# Patient Record
Sex: Female | Born: 1950 | Race: Black or African American | Hispanic: No | Marital: Married | Smoking: Former smoker
Health system: Southern US, Community
[De-identification: ages and names within clinical notes are randomized; demographics above are authoritative.]

## PROBLEM LIST (undated history)

## (undated) DIAGNOSIS — C50919 Malignant neoplasm of unspecified site of unspecified female breast: Secondary | ICD-10-CM

## (undated) DIAGNOSIS — K2960 Other gastritis without bleeding: Secondary | ICD-10-CM

## (undated) DIAGNOSIS — R51 Headache: Secondary | ICD-10-CM

## (undated) DIAGNOSIS — E119 Type 2 diabetes mellitus without complications: Secondary | ICD-10-CM

## (undated) DIAGNOSIS — M549 Dorsalgia, unspecified: Secondary | ICD-10-CM

## (undated) DIAGNOSIS — I10 Essential (primary) hypertension: Secondary | ICD-10-CM

## (undated) DIAGNOSIS — C801 Malignant (primary) neoplasm, unspecified: Secondary | ICD-10-CM

## (undated) DIAGNOSIS — IMO0001 Reserved for inherently not codable concepts without codable children: Secondary | ICD-10-CM

## (undated) DIAGNOSIS — J45909 Unspecified asthma, uncomplicated: Secondary | ICD-10-CM

## (undated) DIAGNOSIS — E039 Hypothyroidism, unspecified: Secondary | ICD-10-CM

## (undated) DIAGNOSIS — M1990 Unspecified osteoarthritis, unspecified site: Secondary | ICD-10-CM

## (undated) DIAGNOSIS — R52 Pain, unspecified: Secondary | ICD-10-CM

## (undated) DIAGNOSIS — M109 Gout, unspecified: Secondary | ICD-10-CM

## (undated) DIAGNOSIS — T4145XA Adverse effect of unspecified anesthetic, initial encounter: Secondary | ICD-10-CM

## (undated) DIAGNOSIS — E669 Obesity, unspecified: Secondary | ICD-10-CM

## (undated) DIAGNOSIS — G4730 Sleep apnea, unspecified: Secondary | ICD-10-CM

## (undated) DIAGNOSIS — F40240 Claustrophobia: Secondary | ICD-10-CM

## (undated) DIAGNOSIS — I509 Heart failure, unspecified: Secondary | ICD-10-CM

## (undated) DIAGNOSIS — K2980 Duodenitis without bleeding: Secondary | ICD-10-CM

## (undated) DIAGNOSIS — T8859XA Other complications of anesthesia, initial encounter: Secondary | ICD-10-CM

## (undated) DIAGNOSIS — R519 Headache, unspecified: Secondary | ICD-10-CM

## (undated) DIAGNOSIS — K5900 Constipation, unspecified: Secondary | ICD-10-CM

## (undated) DIAGNOSIS — J449 Chronic obstructive pulmonary disease, unspecified: Secondary | ICD-10-CM

## (undated) DIAGNOSIS — F419 Anxiety disorder, unspecified: Secondary | ICD-10-CM

## (undated) DIAGNOSIS — K219 Gastro-esophageal reflux disease without esophagitis: Secondary | ICD-10-CM

## (undated) HISTORY — PX: BACK SURGERY: SHX140

## (undated) HISTORY — PX: FRACTURE SURGERY: SHX138

## (undated) HISTORY — PX: TONSILLECTOMY: SUR1361

## (undated) HISTORY — PX: COLONOSCOPY: SHX174

## (undated) HISTORY — PX: OTHER SURGICAL HISTORY: SHX169

## (undated) HISTORY — PX: ABDOMINAL HYSTERECTOMY: SHX81

## (undated) HISTORY — PX: ANKLE FRACTURE SURGERY: SHX122

## (undated) HISTORY — PX: OOPHORECTOMY: SHX86

## (undated) HISTORY — PX: CHOLECYSTECTOMY: SHX55

---

## 2002-03-01 HISTORY — PX: BREAST BIOPSY: SHX20

## 2003-03-19 ENCOUNTER — Other Ambulatory Visit: Payer: Self-pay

## 2003-03-20 ENCOUNTER — Other Ambulatory Visit: Payer: Self-pay

## 2003-03-21 ENCOUNTER — Other Ambulatory Visit: Payer: Self-pay

## 2003-06-13 ENCOUNTER — Other Ambulatory Visit: Payer: Self-pay

## 2004-06-24 ENCOUNTER — Ambulatory Visit: Payer: Self-pay | Admitting: Family Medicine

## 2004-09-28 ENCOUNTER — Ambulatory Visit: Payer: Self-pay | Admitting: Unknown Physician Specialty

## 2005-12-07 ENCOUNTER — Ambulatory Visit: Payer: Self-pay | Admitting: Unknown Physician Specialty

## 2006-12-13 ENCOUNTER — Ambulatory Visit: Payer: Self-pay | Admitting: Unknown Physician Specialty

## 2008-11-08 ENCOUNTER — Ambulatory Visit: Payer: Self-pay | Admitting: Unknown Physician Specialty

## 2008-11-14 ENCOUNTER — Ambulatory Visit: Payer: Self-pay | Admitting: Unknown Physician Specialty

## 2009-06-26 ENCOUNTER — Ambulatory Visit: Payer: Self-pay | Admitting: Ophthalmology

## 2009-07-01 ENCOUNTER — Ambulatory Visit: Payer: Self-pay | Admitting: Ophthalmology

## 2010-02-05 ENCOUNTER — Ambulatory Visit: Payer: Self-pay | Admitting: Unknown Physician Specialty

## 2010-03-12 ENCOUNTER — Ambulatory Visit: Payer: Self-pay | Admitting: Pain Medicine

## 2010-05-14 ENCOUNTER — Ambulatory Visit: Payer: Self-pay | Admitting: Pain Medicine

## 2010-06-03 ENCOUNTER — Ambulatory Visit: Payer: Self-pay | Admitting: Pain Medicine

## 2010-06-18 ENCOUNTER — Ambulatory Visit: Payer: Self-pay | Admitting: Pain Medicine

## 2010-07-09 ENCOUNTER — Ambulatory Visit: Payer: Self-pay | Admitting: Pain Medicine

## 2011-03-03 ENCOUNTER — Ambulatory Visit: Payer: Self-pay | Admitting: Pain Medicine

## 2011-03-11 ENCOUNTER — Ambulatory Visit: Payer: Self-pay | Admitting: Pain Medicine

## 2011-03-24 ENCOUNTER — Ambulatory Visit: Payer: Self-pay | Admitting: Pain Medicine

## 2011-04-29 ENCOUNTER — Ambulatory Visit: Payer: Self-pay | Admitting: Pain Medicine

## 2011-05-17 ENCOUNTER — Ambulatory Visit: Payer: Self-pay | Admitting: Pain Medicine

## 2011-05-20 ENCOUNTER — Ambulatory Visit: Payer: Self-pay | Admitting: Unknown Physician Specialty

## 2011-06-24 ENCOUNTER — Ambulatory Visit: Payer: Self-pay | Admitting: Pain Medicine

## 2011-08-24 ENCOUNTER — Ambulatory Visit: Payer: Self-pay | Admitting: Pain Medicine

## 2012-02-03 ENCOUNTER — Ambulatory Visit: Payer: Self-pay | Admitting: Pain Medicine

## 2012-04-12 ENCOUNTER — Ambulatory Visit: Payer: Self-pay | Admitting: Pain Medicine

## 2012-08-10 ENCOUNTER — Ambulatory Visit: Payer: Self-pay | Admitting: Unknown Physician Specialty

## 2012-08-25 LAB — COMPREHENSIVE METABOLIC PANEL
Albumin: 4.2 g/dL (ref 3.4–5.0)
Anion Gap: 6 — ABNORMAL LOW (ref 7–16)
BUN: 12 mg/dL (ref 7–18)
Calcium, Total: 9.3 mg/dL (ref 8.5–10.1)
Chloride: 97 mmol/L — ABNORMAL LOW (ref 98–107)
Co2: 32 mmol/L (ref 21–32)
Creatinine: 1.24 mg/dL (ref 0.60–1.30)
EGFR (African American): 55 — ABNORMAL LOW
EGFR (Non-African Amer.): 48 — ABNORMAL LOW
Glucose: 149 mg/dL — ABNORMAL HIGH (ref 65–99)
Osmolality: 273 (ref 275–301)
SGOT(AST): 18 U/L (ref 15–37)
SGPT (ALT): 20 U/L (ref 12–78)
Total Protein: 8.3 g/dL — ABNORMAL HIGH (ref 6.4–8.2)

## 2012-08-25 LAB — CBC
HCT: 41.3 % (ref 35.0–47.0)
MCH: 26.3 pg (ref 26.0–34.0)
MCV: 79 fL — ABNORMAL LOW (ref 80–100)
Platelet: 277 10*3/uL (ref 150–440)
RDW: 14.3 % (ref 11.5–14.5)
WBC: 15.1 10*3/uL — ABNORMAL HIGH (ref 3.6–11.0)

## 2012-08-25 LAB — URINALYSIS, COMPLETE
Glucose,UR: NEGATIVE mg/dL (ref 0–75)
Nitrite: NEGATIVE
Ph: 6 (ref 4.5–8.0)
RBC,UR: 23 /HPF (ref 0–5)
Specific Gravity: 1.018 (ref 1.003–1.030)

## 2012-08-26 ENCOUNTER — Inpatient Hospital Stay: Payer: Self-pay | Admitting: Internal Medicine

## 2012-08-27 LAB — BASIC METABOLIC PANEL
Anion Gap: 8 (ref 7–16)
BUN: 10 mg/dL (ref 7–18)
Calcium, Total: 8.9 mg/dL (ref 8.5–10.1)
Chloride: 99 mmol/L (ref 98–107)
Creatinine: 1.2 mg/dL (ref 0.60–1.30)
EGFR (Non-African Amer.): 49 — ABNORMAL LOW
Potassium: 3.3 mmol/L — ABNORMAL LOW (ref 3.5–5.1)

## 2012-08-27 LAB — CBC WITH DIFFERENTIAL/PLATELET
Basophil #: 0 10*3/uL (ref 0.0–0.1)
Basophil %: 0.3 %
Eosinophil #: 0 10*3/uL (ref 0.0–0.7)
Eosinophil %: 0.1 %
MCH: 26 pg (ref 26.0–34.0)
MCHC: 33.1 g/dL (ref 32.0–36.0)
MCV: 78 fL — ABNORMAL LOW (ref 80–100)
Monocyte #: 1.5 x10 3/mm — ABNORMAL HIGH (ref 0.2–0.9)
Neutrophil #: 11.6 10*3/uL — ABNORMAL HIGH (ref 1.4–6.5)
Neutrophil %: 76.8 %
RBC: 5.25 10*6/uL — ABNORMAL HIGH (ref 3.80–5.20)
RDW: 14.5 % (ref 11.5–14.5)

## 2012-08-27 LAB — HEMOGLOBIN A1C: Hemoglobin A1C: 7.2 % — ABNORMAL HIGH (ref 4.2–6.3)

## 2012-08-28 LAB — CBC WITH DIFFERENTIAL/PLATELET
Basophil #: 0 10*3/uL (ref 0.0–0.1)
Eosinophil #: 0 10*3/uL (ref 0.0–0.7)
HCT: 39.6 % (ref 35.0–47.0)
HGB: 12.9 g/dL (ref 12.0–16.0)
Lymphocyte #: 2.7 10*3/uL (ref 1.0–3.6)
MCH: 25.9 pg — ABNORMAL LOW (ref 26.0–34.0)
MCHC: 32.7 g/dL (ref 32.0–36.0)
MCV: 79 fL — ABNORMAL LOW (ref 80–100)
Monocyte %: 10.5 %
Neutrophil #: 11.8 10*3/uL — ABNORMAL HIGH (ref 1.4–6.5)
Neutrophil %: 72.5 %
Platelet: 270 10*3/uL (ref 150–440)
RBC: 5 10*6/uL (ref 3.80–5.20)
WBC: 16.3 10*3/uL — ABNORMAL HIGH (ref 3.6–11.0)

## 2012-08-28 LAB — COMPREHENSIVE METABOLIC PANEL
Alkaline Phosphatase: 63 U/L (ref 50–136)
BUN: 12 mg/dL (ref 7–18)
Bilirubin,Total: 0.9 mg/dL (ref 0.2–1.0)
Calcium, Total: 8.9 mg/dL (ref 8.5–10.1)
Glucose: 146 mg/dL — ABNORMAL HIGH (ref 65–99)
Osmolality: 276 (ref 275–301)
Potassium: 3.2 mmol/L — ABNORMAL LOW (ref 3.5–5.1)
Sodium: 137 mmol/L (ref 136–145)
Total Protein: 7.2 g/dL (ref 6.4–8.2)

## 2012-08-28 LAB — LIPASE, BLOOD: Lipase: 108 U/L (ref 73–393)

## 2012-08-29 LAB — COMPREHENSIVE METABOLIC PANEL
Albumin: 3.1 g/dL — ABNORMAL LOW (ref 3.4–5.0)
Alkaline Phosphatase: 62 U/L (ref 50–136)
Anion Gap: 6 — ABNORMAL LOW (ref 7–16)
BUN: 13 mg/dL (ref 7–18)
Bilirubin,Total: 0.7 mg/dL (ref 0.2–1.0)
Calcium, Total: 8.4 mg/dL — ABNORMAL LOW (ref 8.5–10.1)
Chloride: 104 mmol/L (ref 98–107)
Co2: 32 mmol/L (ref 21–32)
EGFR (African American): 56 — ABNORMAL LOW
Glucose: 156 mg/dL — ABNORMAL HIGH (ref 65–99)
Osmolality: 286 (ref 275–301)
SGOT(AST): 26 U/L (ref 15–37)
SGPT (ALT): 21 U/L (ref 12–78)
Sodium: 142 mmol/L (ref 136–145)
Total Protein: 6.4 g/dL (ref 6.4–8.2)

## 2012-08-29 LAB — CBC WITH DIFFERENTIAL/PLATELET
Basophil %: 0.5 %
HGB: 11.1 g/dL — ABNORMAL LOW (ref 12.0–16.0)
MCH: 26.2 pg (ref 26.0–34.0)
MCHC: 32.9 g/dL (ref 32.0–36.0)
MCV: 80 fL (ref 80–100)
Monocyte #: 1 x10 3/mm — ABNORMAL HIGH (ref 0.2–0.9)
Neutrophil #: 7.6 10*3/uL — ABNORMAL HIGH (ref 1.4–6.5)
Neutrophil %: 60.8 %
RBC: 4.25 10*6/uL (ref 3.80–5.20)
RDW: 14.4 % (ref 11.5–14.5)
WBC: 12.5 10*3/uL — ABNORMAL HIGH (ref 3.6–11.0)

## 2012-09-14 ENCOUNTER — Ambulatory Visit: Payer: Self-pay | Admitting: Gastroenterology

## 2012-09-15 LAB — PATHOLOGY REPORT

## 2012-10-04 ENCOUNTER — Ambulatory Visit: Payer: Self-pay | Admitting: Pain Medicine

## 2012-10-23 ENCOUNTER — Other Ambulatory Visit: Payer: Self-pay | Admitting: Pain Medicine

## 2012-10-23 LAB — MAGNESIUM: Magnesium: 1 mg/dL — ABNORMAL LOW

## 2012-10-23 LAB — HEMOGLOBIN A1C: Hemoglobin A1C: 8 % — ABNORMAL HIGH (ref 4.2–6.3)

## 2012-10-27 ENCOUNTER — Ambulatory Visit: Payer: Self-pay | Admitting: Pain Medicine

## 2013-07-12 DIAGNOSIS — M519 Unspecified thoracic, thoracolumbar and lumbosacral intervertebral disc disorder: Secondary | ICD-10-CM | POA: Insufficient documentation

## 2013-07-12 DIAGNOSIS — J449 Chronic obstructive pulmonary disease, unspecified: Secondary | ICD-10-CM | POA: Insufficient documentation

## 2013-07-12 DIAGNOSIS — M109 Gout, unspecified: Secondary | ICD-10-CM | POA: Insufficient documentation

## 2013-07-12 DIAGNOSIS — E119 Type 2 diabetes mellitus without complications: Secondary | ICD-10-CM | POA: Insufficient documentation

## 2013-07-12 DIAGNOSIS — I10 Essential (primary) hypertension: Secondary | ICD-10-CM | POA: Insufficient documentation

## 2013-07-12 DIAGNOSIS — M1990 Unspecified osteoarthritis, unspecified site: Secondary | ICD-10-CM | POA: Insufficient documentation

## 2014-01-03 ENCOUNTER — Ambulatory Visit: Payer: Self-pay | Admitting: Internal Medicine

## 2014-02-07 ENCOUNTER — Ambulatory Visit: Payer: Self-pay | Admitting: Internal Medicine

## 2014-02-20 DIAGNOSIS — E039 Hypothyroidism, unspecified: Secondary | ICD-10-CM | POA: Insufficient documentation

## 2014-03-01 DIAGNOSIS — C50919 Malignant neoplasm of unspecified site of unspecified female breast: Secondary | ICD-10-CM

## 2014-03-01 HISTORY — PX: BREAST LUMPECTOMY: SHX2

## 2014-03-01 HISTORY — PX: BREAST BIOPSY: SHX20

## 2014-03-01 HISTORY — DX: Malignant neoplasm of unspecified site of unspecified female breast: C50.919

## 2014-06-14 ENCOUNTER — Other Ambulatory Visit: Payer: Self-pay | Admitting: Internal Medicine

## 2014-06-14 DIAGNOSIS — N63 Unspecified lump in unspecified breast: Secondary | ICD-10-CM

## 2014-06-21 NOTE — Consult Note (Signed)
Pt seen and examined. Full consult to follow. Admitted with intractable nausea/vomiting plus low abd pain/tenderness. Has UTI. Prob has diverticulitis as well though CT has not been schedule due to intractable vomiting. Has hx of indigestion and has been on prilosec in the past. Also, has experienced changes in bowel habits recently. Had GB surgery  in the past.  Apparently, patient was seen in our office recently and was scheduled for both EGD/colon on 7/7. Though her upper GI sxs may be related to ileus/diverticulitis, will plan EGD tomorrow and keep colonoscopy on schedule for 7/7 for now. Thanks.   Electronic Signatures: Verdie Shire (MD) (Signed on 29-Jun-14 09:27)  Authored   Last Updated: 29-Jun-14 10:33 by Verdie Shire (MD)

## 2014-06-21 NOTE — Consult Note (Signed)
PATIENT NAME:  Teresa Holland, YBANEZ MR#:  [id number] DATE OF BIRTH:  1950/11/27  DATE OF CONSULTATION:  08/28/2012  CONSULTING PHYSICIAN:  Lupita Dawn. Crew Goren, MD  REASON FOR REFERRAL: Intractable nausea and vomiting.   HISTORY OF PRESENT ILLNESS: The patient is a 64 year old, obese, black female who has history of diabetes and hypertension. She also is on chronic narcotic dependence because of her chronic back pain. She presents to the Emergency Room with intractable nausea and vomiting for the past 3 to 4 days. She initially denied abdominal pain. However, she kind of having some lower abdominal cramping afterwards. She is not able to keep anything down.  Even her medications, she was not able to tolerate. When she presented to the Emergency Room, they checked some blood and urinalysis was checked.   The results were consistent with urinary tract infection. She was also hypertensive as well because she has not been able to take her antihypertensive agents.   PAST MEDICAL HISTORY: Diabetes, hypertension, obesity, and chronic back pain.   PAST SURGICAL HISTORY:  Include back surgery, hysterectomy and ankle surgery.   ALLERGIES: SHE IS ALLERGIC TO ASPIRIN.   HOME MEDICATIONS: include metoprolol twice a day, metformin 1000 mg once a day, potassium twice a day, losartan daily, Lasix daily and enalapril 20 mg daily as well as Zocor 80 mg once a day. She also takes OxyContin 40 mg every 12 hours plus omeprazole daily.   SOCIAL HISTORY: She still smokes a pack a day. She denies any alcohol use.   FAMILY HISTORY: Notable for colon cancer and lung cancer.  Apparently she was in our GI office and was scheduled to have upper endoscopy and colonoscopy on 06/07. She has noticed some changes in her bowel habits as well recently.   PHYSICAL EXAMINATION:  The patient is in no acute distress.  She is afebrile. Vital signs are stable. She is morbidly obese.   HEENT: Normocephalic, atraumatic head. Pupils are equally  reactive. Throat was clear.   NECK: Supple.   CARDIAC: Regular rhythm and rate without murmurs.   LUNGS: Clear bilaterally.   ABDOMEN: Normoactive bowel sounds, soft. There was some mild tenderness in the lower abdomen. There is no rebound or guarding. There is no hepatomegaly. She had active bowel sounds. There are no palpable masses.   EXTREMITIES: No clubbing, cyanosis or edema.   REVIEW OF SYSTEMS: Please there is no significant change from the review of symptoms that was dictated on admission, on 06/28.   LABS/STUDIES: Urinalysis showed 3+ and equal leukocyte esterase with 90 WBCs. Creatinine was 1.24, the rest of the metabolic panel was normal. White count was 15,000, hemoglobin 13.7. Lipase was 198.   IMPRESSION:  This is female with intractable nausea, vomiting. She has urinary tract infection. It is possible she may have diverticulitis as well with changes in bowel habits and low abdominal pain. Unfortunately because of her nausea and vomiting, we cannot order a CT scan to confirm this. The nausea and vomiting could be related to her urinary tract infection or diverticulitis. It is possible she may have partial small bowel obstruction. She is not completely obstructed because she is still able to move her bowels. We will still need to rule out other causes of nausea and vomiting including peptic ulcer disease or esophagitis. Her gallbladder was removed some years ago.   RECOMMENDATION: Therefore, we will schedule an upper endoscopy tomorrow to at least evaluate the upper GI tract for other causes of nausea and vomiting. She  is on antibiotics, which she will continue. We will keep the schedule for the colonoscopy; it was already scheduled for 07/07. Will move the colonoscopy scheduled if her condition changes.    ____________________________ Lupita Dawn. Candace Cruise, MD pyo:rw D: 08/28/2012 13:51:05 ET T: 08/28/2012 14:37:59 ET JOB#: [id number]  cc: Lupita Dawn. Candace Cruise, MD, <Dictator> Lupita Dawn Raylei Losurdo  MD ELECTRONICALLY SIGNED 08/28/2012 16:24

## 2014-06-21 NOTE — H&P (Signed)
PATIENT NAME:  Teresa Holland, Teresa Holland MR#:  [id number] DATE OF BIRTH:  1950-10-21  DATE OF ADMISSION:  08/26/2012  PRIMARY CARE PHYSICIAN:  Dr. Kem Kays.   CHIEF COMPLAINT:  Nausea, vomiting.   HISTORY OF PRESENT ILLNESS:  Teresa Holland is a 64 year old morbidly obese white female with history of hypertension, diabetes mellitus, chronic narcotic dependence, presented to the Emergency Department with 3 to 4 days of nausea, vomiting multiple times.  Denies having any abdominal pain.  Unable to keep down any food.  Try to take her pain medications, unable to keep down.  Concerning this, also stopped taking all blood pressure medication.  Work-up in the Emergency Department, the patient has a urinary tract infection.  Is also found to have elevated white blood cell count.  The patient states has been having subjective fevers.  Blood and urine cultures were obtained, given one dose of Rocephin in the Emergency Department.  The patient's blood pressure is 208/90.  Denies having any chest pain, palpitations.  Denies having any lower extremity swelling.  Has mild cough.  No productive sputum.   PAST MEDICAL HISTORY:  Diabetes mellitus, hypertension, morbid obesity, chronic back pain.   PAST SURGICAL HISTORY:   1.  Ankle surgery. 2.  Hysterectomy.  3.  Back surgery.   ALLERGIES:  ASPIRIN.   HOME MEDICATIONS: 1. Metoprolol tartrate 50 mg 2 times a day.  2.  Metformin 1000 mg once a day.  3.  Klor-Con 10 mEq 2 times a day.  4.  Losartan hydrochlorothiazide 25/100 mg daily.  5.  Lasix 20 mg daily.  6.  Enalapril 20 mg once a day.  7.  Vitamin D3 2000 units once a day.  8.  Simvastatin 80 mg once a day. 9.  OxyContin 40 mg every 12 hours. 10.  Omeprazole 20 mg once a day.   SOCIAL HISTORY:  Continues to smoke 1 pack a day.  Denies drinking alcohol or using illicit drugs.  Married, lives with her husband, currently not working.   FAMILY HISTORY:  Mother died of colon cancer.  Father died of lung  cancer.   REVIEW OF SYSTEMS: CONSTITUTIONAL:  Generalized weakness, fatigue.  EYES:  No change in vision.  EARS, NOSE, THROAT:  No change in hearing.  RESPIRATORY:  Has cough.  No shortness of breath.  CARDIOVASCULAR:  No chest pain, palpitations.  GASTROINTESTINAL:  The patient has nausea, vomiting.  No diarrhea.  GENITOURINARY:  Has dysuria.  No hematuria.  ENDOCRINE:  No polyphagia or polydipsia.  Has diabetes mellitus.  HEMATOLOGIC:  No easy bruising or bleeding.  SKIN:  No rash or lesions.  MUSCULOSKELETAL:  Good range of motion in all the extremities.   NEUROLOGIC:  No weakness or numbness in any part of the body.   PHYSICAL EXAMINATION: GENERAL:  This is a well-built, well-nourished, morbidly obese female lying down in the bed, not in distress.  VITAL SIGNS:  Temperature 98.4, pulse 79, blood pressure 207/108, respiratory rate of 18, oxygen saturations 95% on room air.  HEENT:  Head normocephalic, atraumatic.  Eyes, no scleral icterus.  Conjunctivae normal.  Pupils equal and react to light.  Mucous membranes moist.  No pharyngeal erythema.  NECK:  Supple.  No lymphadenopathy.  No JVD.  No carotid bruit.  No thyromegaly.  CHEST:  Has no focal tenderness.  LUNGS:  Bilaterally clear to auscultation.  HEART:  S1 and S2 regular.  No murmurs are heard.  No pedal edema.  Pulses 2+.  SKIN:  No rash or lesions.  MUSCULOSKELETAL:  Good range of motion in all the extremities.  ABDOMEN:  Bowel sounds plus.  Soft.  Mild tenderness in the suprapubic area.  No rebound, no guarding.  NEUROLOGIC:  The patient is alert, oriented to place, person and time.  Cranial nerves II through XII intact.  Motor 5 by 5.   LABORATORY DATA:  UA leukocytes esterase 3+, WBC of 90, has epithelial cells.  CMP, BUN 12, creatinine of 1.24.  The rest of all the values are within normal limits.   CBC, WBC of 15, hemoglobin 13.7, the rest of the values are within normal limits.   Lipase 198.   ASSESSMENT AND PLAN:   Teresa Holland is a 64 year old female who comes to the Emergency Department with nausea and vomiting.  1.  Pyelonephritis.  Follow up with urine and blood cultures.  Continue with Rocephin, IV fluids.  Based on the cultures we will de-escalate the antibiotics.  2.  Nausea, vomiting, most likely from the urinary tract infection, however cannot exclude gastroparesis and narcotic pain medication.  We will keep the patient nothing by mouth, continue with IV fluids and follow up.  I will provide antinausea medications.  3.  Diabetes mellitus, insulin-dependent.  Continue the home dose.  4.  Hypertension, poorly-controlled secondary to noncompliance.  Start back on home medications and follow up.  5.  Keep the patient on DVT prophylaxis with Lovenox.   Time spent 39min.   ____________________________ Monica Becton, MD pv:ea D: 08/26/2012 01:16:41 ET T: 08/26/2012 03:19:43 ET JOB#: [id number]  cc: Monica Becton, MD, <Dictator> Monica Becton MD ELECTRONICALLY SIGNED 09/06/2012 21:53

## 2014-06-21 NOTE — Discharge Summary (Signed)
PATIENT NAME:  Teresa Holland, Teresa Holland MR#:  [id number] DATE OF BIRTH:  23-Jan-1951  DATE OF ADMISSION:  08/26/2012 DATE OF DISCHARGE:  08/29/2012   DISCHARGE DIAGNOSES:  1. Acute diverticulitis.  2. Enterococcal urinary tract infection.  3. Candidal esophagitis.  4. Diabetes mellitus.  5. Restrictive airway disease.  6. Hypertension.   DISCHARGE MEDICATIONS:  1. Metformin 1000 mg daily.  2. Metoprolol tartrate 50 mg b.i.d. 3. Omeprazole 20 mg daily.  4. Furosemide 20 mg daily as needed.  5. Potassium chloride 10 mEq b.i.d. 6. Vitamin D 2000 units daily.  7. Enalapril 20 mg daily.  8. Fluconazole 100 mg daily for 7 days.  9. Albuterol 2 puffs q.6 p.r.n.  10. Levaquin 500 mg daily for 7 days.  11. Senna 1 b.i.d. p.r.n. constipation.   REASON FOR ADMISSION: A 64 year old female presents with intractable nausea and vomiting. Please see H and P for HPI, past medical history and physical exam.   HOSPITAL COURSE: The patient was admitted, hydrated and found to have a fairly pansensitive enterococcal UTI. Initially, her white count was not improving with antibiotics. She was switched over to [place]. Her white count dropped to 12,000 prior to discharge. Her abdominal pain improved. Her bowels are moving better. Her exam was consistent with a UTI and diverticulitis. She is set up for a colonoscopy in early July. She underwent EGD which showed candidal esophagitis from her inhalers and diabetes. The Advair was discontinued. She was placed on Diflucan. Will hold Zocor for this. Followup scope in 2 weeks. Her potassium was replaced throughout this time.   OVERALL PROGNOSIS: Good.   FOLLOWUP: With Dr. Sabra Heck in 2 weeks.   ____________________________ Rusty Aus, MD mfm:OSi D: 08/29/2012 07:45:29 ET T: 08/29/2012 08:21:19 ET JOB#: [id number]  cc: Rusty Aus, MD, <Dictator> MARK Roselee Culver MD ELECTRONICALLY SIGNED 08/30/2012 8:11

## 2014-06-21 NOTE — Consult Note (Signed)
Less nausea and vomiting. EGD showed diffuse candidal esophagitis, which can cause N/V. Diflucan 100 mg daily x 5 days ordered. Since diflucan interacts with zocor, zocor put on hold until diflucan finished. Advance diet as tolerated. Continue Abx for presumed UTI/diverticulitis. I will be out tomorrow but will check back on Wed if patient still here.  Electronic Signatures: Verdie Shire (MD)  (Signed on 30-Jun-14 13:44)  Authored  Last Updated: 30-Jun-14 13:44 by Verdie Shire (MD)

## 2014-08-08 ENCOUNTER — Ambulatory Visit
Admission: RE | Admit: 2014-08-08 | Discharge: 2014-08-08 | Disposition: A | Discharge status: Home / Self Care | Payer: PRIVATE HEALTH INSURANCE | Source: Ambulatory Visit | Attending: Internal Medicine | Admitting: Internal Medicine

## 2014-08-08 DIAGNOSIS — N63 Unspecified lump in unspecified breast: Secondary | ICD-10-CM

## 2014-08-13 ENCOUNTER — Other Ambulatory Visit: Payer: Self-pay | Admitting: Internal Medicine

## 2014-08-13 DIAGNOSIS — N63 Unspecified lump in unspecified breast: Secondary | ICD-10-CM

## 2014-08-13 DIAGNOSIS — R928 Other abnormal and inconclusive findings on diagnostic imaging of breast: Secondary | ICD-10-CM

## 2014-08-22 ENCOUNTER — Ambulatory Visit
Admission: RE | Admit: 2014-08-22 | Discharge: 2014-08-22 | Disposition: A | Discharge status: Home / Self Care | Payer: No Typology Code available for payment source | Source: Ambulatory Visit | Attending: Internal Medicine | Admitting: Internal Medicine

## 2014-08-22 ENCOUNTER — Other Ambulatory Visit: Payer: Self-pay | Admitting: Internal Medicine

## 2014-08-22 DIAGNOSIS — K760 Fatty (change of) liver, not elsewhere classified: Secondary | ICD-10-CM | POA: Diagnosis not present

## 2014-08-22 DIAGNOSIS — R1084 Generalized abdominal pain: Secondary | ICD-10-CM

## 2014-08-22 DIAGNOSIS — M47896 Other spondylosis, lumbar region: Secondary | ICD-10-CM | POA: Insufficient documentation

## 2014-08-22 MED ORDER — IOHEXOL 300 MG/ML  SOLN
100.0000 mL | Freq: Once | INTRAMUSCULAR | Status: AC | PRN
  Administered 2014-08-22: 100 mL via INTRAVENOUS

## 2014-08-23 ENCOUNTER — Ambulatory Visit: Payer: PRIVATE HEALTH INSURANCE

## 2014-08-28 ENCOUNTER — Other Ambulatory Visit: Payer: Self-pay | Admitting: Internal Medicine

## 2014-08-28 DIAGNOSIS — N63 Unspecified lump in unspecified breast: Secondary | ICD-10-CM

## 2014-08-28 DIAGNOSIS — R928 Other abnormal and inconclusive findings on diagnostic imaging of breast: Secondary | ICD-10-CM

## 2014-08-29 ENCOUNTER — Other Ambulatory Visit: Payer: Self-pay | Admitting: Internal Medicine

## 2014-08-29 ENCOUNTER — Ambulatory Visit
Admission: RE | Admit: 2014-08-29 | Discharge: 2014-08-29 | Disposition: A | Discharge status: Home / Self Care | Payer: PRIVATE HEALTH INSURANCE | Source: Ambulatory Visit | Attending: Internal Medicine | Admitting: Internal Medicine

## 2014-08-29 DIAGNOSIS — R928 Other abnormal and inconclusive findings on diagnostic imaging of breast: Secondary | ICD-10-CM

## 2014-08-29 DIAGNOSIS — N63 Unspecified lump in unspecified breast: Secondary | ICD-10-CM

## 2014-08-29 DIAGNOSIS — C50911 Malignant neoplasm of unspecified site of right female breast: Secondary | ICD-10-CM | POA: Diagnosis not present

## 2014-08-29 DIAGNOSIS — Z803 Family history of malignant neoplasm of breast: Secondary | ICD-10-CM | POA: Diagnosis not present

## 2014-09-03 LAB — SURGICAL PATHOLOGY

## 2014-09-12 ENCOUNTER — Encounter
Admission: RE | Admit: 2014-09-12 | Discharge: 2014-09-12 | Disposition: A | Discharge status: Home / Self Care | Payer: PRIVATE HEALTH INSURANCE | Source: Ambulatory Visit | Attending: Surgery | Admitting: Surgery

## 2014-09-12 ENCOUNTER — Encounter: Payer: Self-pay | Admitting: Oncology

## 2014-09-12 ENCOUNTER — Inpatient Hospital Stay: Payer: PRIVATE HEALTH INSURANCE | Attending: Oncology | Admitting: Oncology

## 2014-09-12 VITALS — BP 160/92 | HR 77 | Wt 380.1 lb

## 2014-09-12 DIAGNOSIS — R1084 Generalized abdominal pain: Secondary | ICD-10-CM | POA: Diagnosis not present

## 2014-09-12 DIAGNOSIS — M1990 Unspecified osteoarthritis, unspecified site: Secondary | ICD-10-CM | POA: Insufficient documentation

## 2014-09-12 DIAGNOSIS — E039 Hypothyroidism, unspecified: Secondary | ICD-10-CM | POA: Insufficient documentation

## 2014-09-12 DIAGNOSIS — C50511 Malignant neoplasm of lower-outer quadrant of right female breast: Secondary | ICD-10-CM | POA: Insufficient documentation

## 2014-09-12 DIAGNOSIS — F17210 Nicotine dependence, cigarettes, uncomplicated: Secondary | ICD-10-CM | POA: Diagnosis not present

## 2014-09-12 DIAGNOSIS — Z90710 Acquired absence of both cervix and uterus: Secondary | ICD-10-CM | POA: Diagnosis not present

## 2014-09-12 DIAGNOSIS — Z8669 Personal history of other diseases of the nervous system and sense organs: Secondary | ICD-10-CM | POA: Diagnosis not present

## 2014-09-12 DIAGNOSIS — E669 Obesity, unspecified: Secondary | ICD-10-CM | POA: Diagnosis not present

## 2014-09-12 DIAGNOSIS — J449 Chronic obstructive pulmonary disease, unspecified: Secondary | ICD-10-CM | POA: Insufficient documentation

## 2014-09-12 DIAGNOSIS — M549 Dorsalgia, unspecified: Secondary | ICD-10-CM | POA: Diagnosis not present

## 2014-09-12 DIAGNOSIS — R140 Abdominal distension (gaseous): Secondary | ICD-10-CM | POA: Diagnosis not present

## 2014-09-12 DIAGNOSIS — Z170 Estrogen receptor positive status [ER+]: Secondary | ICD-10-CM | POA: Diagnosis not present

## 2014-09-12 DIAGNOSIS — K219 Gastro-esophageal reflux disease without esophagitis: Secondary | ICD-10-CM | POA: Insufficient documentation

## 2014-09-12 DIAGNOSIS — G4730 Sleep apnea, unspecified: Secondary | ICD-10-CM | POA: Insufficient documentation

## 2014-09-12 DIAGNOSIS — I10 Essential (primary) hypertension: Secondary | ICD-10-CM | POA: Diagnosis not present

## 2014-09-12 DIAGNOSIS — K760 Fatty (change of) liver, not elsewhere classified: Secondary | ICD-10-CM | POA: Diagnosis not present

## 2014-09-12 DIAGNOSIS — F329 Major depressive disorder, single episode, unspecified: Secondary | ICD-10-CM | POA: Diagnosis not present

## 2014-09-12 DIAGNOSIS — E119 Type 2 diabetes mellitus without complications: Secondary | ICD-10-CM | POA: Insufficient documentation

## 2014-09-12 DIAGNOSIS — Z9049 Acquired absence of other specified parts of digestive tract: Secondary | ICD-10-CM | POA: Insufficient documentation

## 2014-09-12 DIAGNOSIS — M129 Arthropathy, unspecified: Secondary | ICD-10-CM | POA: Insufficient documentation

## 2014-09-12 DIAGNOSIS — G8929 Other chronic pain: Secondary | ICD-10-CM

## 2014-09-12 DIAGNOSIS — I708 Atherosclerosis of other arteries: Secondary | ICD-10-CM | POA: Insufficient documentation

## 2014-09-12 DIAGNOSIS — Z01810 Encounter for preprocedural cardiovascular examination: Secondary | ICD-10-CM | POA: Insufficient documentation

## 2014-09-12 DIAGNOSIS — J45909 Unspecified asthma, uncomplicated: Secondary | ICD-10-CM | POA: Diagnosis not present

## 2014-09-12 DIAGNOSIS — C50411 Malignant neoplasm of upper-outer quadrant of right female breast: Secondary | ICD-10-CM | POA: Diagnosis not present

## 2014-09-12 DIAGNOSIS — F419 Anxiety disorder, unspecified: Secondary | ICD-10-CM | POA: Insufficient documentation

## 2014-09-12 DIAGNOSIS — C50911 Malignant neoplasm of unspecified site of right female breast: Secondary | ICD-10-CM

## 2014-09-12 HISTORY — DX: Heart failure, unspecified: I50.9

## 2014-09-12 HISTORY — DX: Adverse effect of unspecified anesthetic, initial encounter: T41.45XA

## 2014-09-12 HISTORY — DX: Type 2 diabetes mellitus without complications: E11.9

## 2014-09-12 HISTORY — DX: Hypothyroidism, unspecified: E03.9

## 2014-09-12 HISTORY — DX: Unspecified asthma, uncomplicated: J45.909

## 2014-09-12 HISTORY — DX: Headache, unspecified: R51.9

## 2014-09-12 HISTORY — DX: Essential (primary) hypertension: I10

## 2014-09-12 HISTORY — DX: Unspecified osteoarthritis, unspecified site: M19.90

## 2014-09-12 HISTORY — DX: Other complications of anesthesia, initial encounter: T88.59XA

## 2014-09-12 HISTORY — DX: Sleep apnea, unspecified: G47.30

## 2014-09-12 HISTORY — DX: Anxiety disorder, unspecified: F41.9

## 2014-09-12 HISTORY — DX: Gastro-esophageal reflux disease without esophagitis: K21.9

## 2014-09-12 HISTORY — DX: Malignant (primary) neoplasm, unspecified: C80.1

## 2014-09-12 HISTORY — DX: Gout, unspecified: M10.9

## 2014-09-12 HISTORY — DX: Headache: R51

## 2014-09-12 HISTORY — DX: Pain, unspecified: R52

## 2014-09-12 NOTE — Progress Notes (Signed)
Met with patient, and husband at initial visit with Dr. Oliva Bustard.  She met with Dr. Nicholes Stairs last Thursday. Breast Cancer treatment Handbook/folderwith hospital services given to her at that visit.  Scheduled for lumpectomy with sentinel node biopsy 09/20/14.  She is to meet with Dr. Baruch Gouty on 09/19/14 to discuss radiation options. States she has had multiple back surgeries.   Unable to work due to back problems. Husband works 12 hour shifts .  Discussed some of the services offered through the [facility], and offered to make referrals if needed.  Escorted to pre-admit testing.

## 2014-09-12 NOTE — Progress Notes (Signed)
Patient does not have living will.  Information given.  Former cigarette smoker.  Now smokes e-cigarettes.

## 2014-09-12 NOTE — Patient Instructions (Signed)
  Your procedure is scheduled on:09/20/14 Report to [place]  8:00 To find out your arrival time please call [phone] between 1PM - 3PM on   Remember: Instructions that are not followed completely may result in serious medical risk, up to and including death, or upon the discretion of your surgeon and anesthesiologist your surgery may need to be rescheduled.    ___X_ 1. Do not eat food or drink liquids after midnight. No gum chewing or hard candies.     _X___ 2. No Alcohol for 24 hours before or after surgery.   ____ 3. Bring all medications with you on the day of surgery if instructed.    _X___ 4. Notify your doctor if there is any change in your medical condition     (cold, fever, infections).     Do not wear jewelry, make-up, hairpins, clips or nail polish.  Do not wear lotions, powders, or perfumes. You may wear deodorant.  Do not shave 48 hours prior to surgery. Men may shave face and neck.  Do not bring valuables to the hospital.    [facility] is not responsible for any belongings or valuables.               Contacts, dentures or bridgework may not be worn into surgery.  Leave your suitcase in the car. After surgery it may be brought to your room.  For patients admitted to the hospital, discharge time is determined by your                treatment team.   Patients discharged the day of surgery will not be allowed to drive home.   Please read over the following fact sheets that you were given:   Surgical Site Infection Prevention   ____ Take these medicines the morning of surgery with A SIP OF WATER:    1. VASOTEC  2. MAG OX  3. METOPROLOL  4.PROTONIX  5.  6.  ____ Fleet Enema (as directed)   _X___ Use CHG Soap as directed  ___X_ Use inhalers on the day of surgery  ____ Stop metformin 2 days prior to surgery    ____ Take 1/2 of usual insulin dose the night before surgery and none on the morning of surgery.   ____ Stop Coumadin/Plavix/aspirin  on   ____ Stop Anti-inflammatories on    ____ Stop supplements until after surgery.    ____ Bring C-Pap to the hospital.

## 2014-09-13 ENCOUNTER — Institutional Professional Consult (permissible substitution): Payer: PRIVATE HEALTH INSURANCE | Admitting: Radiation Oncology

## 2014-09-15 ENCOUNTER — Encounter: Payer: Self-pay | Admitting: Oncology

## 2014-09-15 DIAGNOSIS — C50911 Malignant neoplasm of unspecified site of right female breast: Secondary | ICD-10-CM | POA: Insufficient documentation

## 2014-09-15 NOTE — Progress Notes (Signed)
Cancer Center @ [facility] [phone]) [phone]  [phone]   INITIAL CONSULT  Teresa Holland OB: December 17, 1950  MR#: [id number]  BMD#:[id number]  Patient Care Team: Danella Penton, MD as PCP - General (Internal Medicine)  CHIEF COMPLAINT:  Chief Complaint  Patient presents with  . New Evaluation    VISIT DIAGNOSIS:     ICD-9-CM ICD-10-CM   1. Cancer of right breast 174.9 C50.911      Oncology History   1.  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  Morbid obesity 3.  Previous left breast biopsy in 1998 negative     Cancer of right breast   09/15/2014 Initial Diagnosis Cancer of right breast    [name] came with stereotactic biopsy of the right upper outer quadrant of carcinoma of breast. Patient is now being evaluated prior to definitive surgical intervention INTERVAL HISTORY: Patient had a bilateral mammogram in November of [address] a mass was noted in the right breast.  She had a distal spot compression and further follow-up mammogram in June of 2016 this mass revealed a 9 x 8 x 6 mm mass.  Ultrasound did not reveal any lymph node involvement.  Stereotactic biopsy was positive for invasive mammary carcinoma.  Which was estrogen receptor progesterone receptor positive.  And HER-2/neu receptor negative. Patient underwent stereotactic biopsy and was referred to me prior to surgical evaluation for planning of treatment  REVIEW OF SYSTEMS:   GENERAL:  Feels good.  Active.  No fevers,   Patient is  morbidly obese with weighing 380 pounds PERFORMANCE STATUS (ECOG):  1 HEENT:  No visual changes, runny nose, sore throat, mouth sores or tenderness. Lungs: No shortness of breath or cough.  No hemoptysis. Cardiac:  No chest pain, palpitations, orthopnea, or PND. GI:  No nausea, vomiting,  diarrhea, constipation, melena or hematochezia. GU:  No urgency, frequency, dysuria, or hematuria. Musculoskeletal: Neck back pain with multiple back surgery  Extremities:  No pain or swelling. Skin:  No rashes or skin changes. Neuro:  No headache, numbness or weakness, balance or coordination issues. Endocrine:  No diabetes, thyroid issues, hot flashes or night sweats. Psych:  No mood changes, depression or anxiety. Pain:  No focal pain. Review of systems:  All other systems reviewed and found to be negative.  As per HPI. Otherwise, a complete review of systems is negatve.  PAST MEDICAL HISTORY: Past Medical History  Diagnosis Date  . Complication of anesthesia     heart issues 2004 during gb surgery had to be revived  . Hypertension   . Sleep apnea     could not use cpap uses 1.5 l [place] at night  . Asthma   . Hypothyroidism   . Diabetes mellitus without complication   . Depression   . Anxiety   . GERD (gastroesophageal reflux disease)   . Headache   . Arthritis   . Cancer     breast  . Gout   . Pain     chronic back  . CHF (congestive heart failure)     PAST SURGICAL HISTORY: Past Surgical History  Procedure Laterality Date  . Breast biopsy Left 2004    negative  . Cholecystectomy    . Abdominal hysterectomy    . Oophorectomy    . Ankle fracture surgery    . Back surgery  lumbar fusion    FAMILY HISTORY Family History  Problem Relation Age of Onset  . Breast cancer Paternal Aunt     45's       ADVANCED DIRECTIVES:  Patient does not have any living will or healthcare power of attorney.  Information was given .  Available resources had been discussed.  We will follow-up on subsequent appointments regarding this issue  HEALTH MAINTENANCE: History  Substance Use Topics  . Smoking status: Current Every Day Smoker    Types: E-cigarettes  . Smokeless tobacco: Never Used  . Alcohol Use: Yes      Allergies  Allergen Reactions  . Aspirin Nausea  Only    Stomach burning  . Meloxicam Nausea Only  . Nsaids Nausea Only  . Furosemide Palpitations    Current Outpatient Prescriptions  Medication Sig Dispense Refill  . albuterol (VENTOLIN HFA) 108 (90 BASE) MCG/ACT inhaler Inhale into the lungs.    . Cholecalciferol (VITAMIN D3) 2000 UNITS capsule Take by mouth.    . diphenhydrAMINE (BENADRYL) 25 mg capsule Take by mouth.    . enalapril (VASOTEC) 20 MG tablet Take by mouth.    . furosemide (LASIX) 20 MG tablet Take by mouth.    Marland Kitchen glimepiride (AMARYL) 2 MG tablet Take by mouth.    . hydrochlorothiazide (HYDRODIURIL) 25 MG tablet Take by mouth.    Marland Kitchen HYDROcodone-acetaminophen (NORCO/VICODIN) 5-325 MG per tablet Take by mouth.    . levothyroxine (SYNTHROID, LEVOTHROID) 75 MCG tablet Take by mouth.    . Linaclotide (LINZESS) 145 MCG CAPS capsule bid    . magnesium oxide (MAG-OX) 400 MG tablet TAKE ONE TABLET BY MOUTH TWICE DAILY    . metoprolol (LOPRESSOR) 50 MG tablet Take by mouth.    . pantoprazole (PROTONIX) 40 MG tablet Take by mouth.    . polyethylene glycol (MIRALAX / GLYCOLAX) packet Take by mouth.    . potassium chloride (K-DUR) 10 MEQ tablet Take by mouth.    . simvastatin (ZOCOR) 80 MG tablet Take by mouth.    Marland Kitchen guaiFENesin (MUCINEX) 600 MG 12 hr tablet Take by mouth 2 (two) times daily as needed.    Marland Kitchen Phenylephrine-Acetaminophen 5-325 MG TABS Take by mouth as needed.     No current facility-administered medications for this visit.    OBJECTIVE: PHYSICAL EXAM: GENERAL:  Well developed, well nourished, sitting comfortably in the exam room in no acute distress. MENTAL STATUS:  Alert and oriented to person, place and time. HEAD:  .  Normocephalic, atraumatic, face symmetric, no Cushingoid features. EYES: .  Pupils equal round and reactive to light and accomodation.  No conjunctivitis or scleral icterus.   RESPIRATORY:  Clear to auscultation without rales, wheezes or rhonchi. CARDIOVASCULAR:  Regular rate and rhythm without  murmur, rub or gallop. BREAST:  Right breast without masses, biopsy site has in duration . skin changes or nipple discharge.  Left breast without masses, skin changes or nipple discharge. ABDOMEN:  Soft, non-tender, with active bowel sounds, and no hepatosplenomegaly.  No masses. BACK:  No CVA tenderness.  No tenderness on percussion of the back or rib cage. SKIN:  No rashes, ulcers or lesions. EXTREMITIES: No edema, no skin discoloration or tenderness.  No palpable cords. LYMPH NODES: No palpable cervical, supraclavicular, axillary or inguinal adenopathy  NEUROLOGICAL: Unremarkable. PSYCH:  Appropriate.  Filed Vitals:   09/12/14 0845  BP: 160/92  Pulse: 77     There is no height on file to calculate BMI.    ECOG  FS:1 - Symptomatic but completely ambulatory  Pathology report has been reviewed.  All the x-rays were reviewed.:     STUDIES: Ct Abdomen Pelvis W Contrast  08/22/2014   CLINICAL DATA:  Generalized abdominal pain and bloating the day. No injury.  EXAM: CT ABDOMEN AND PELVIS WITH CONTRAST  TECHNIQUE: Multidetector CT imaging of the abdomen and pelvis was performed using the standard protocol following bolus administration of intravenous contrast.  CONTRAST:  100 mL OMNIPAQUE IOHEXOL 300 MG/ML  SOLN  COMPARISON:  None.  FINDINGS: Lung bases are clear.  No pleural or pericardial effusion.  There is diffuse fatty infiltration of the liver without focal lesion. The gallbladder has been removed. The adrenal glands, spleen, pancreas, kidneys and biliary tree are unremarkable. There is extensive aortoiliac atherosclerosis without aneurysm.  The patient is status post hysterectomy. The stomach, small and large bowel and appendix appear normal. No adenopathy or fluid is identified.  No focal bony abnormality is seen. The patient has lumbar spondylosis appearing worst at L4-5 where there is advanced facet degenerative disease with loss of disc space height and vacuum disc phenomenon. Trace  anterolisthesis L4 on L5 due to facet arthropathy is noted.  IMPRESSION: No acute abnormality or finding to explain the patient's symptoms.  Fatty infiltration of the liver.  Status post cholecystectomy and hysterectomy.  Aortoiliac atherosclerosis without aneurysm.  Lower lumbar spondylosis.   Electronically Signed   By: Inge Rise M.D.   On: 08/22/2014 18:43   Mm Digital Diagnostic Unilat R  09/03/2014   ADDENDUM REPORT: 09/03/2014 15:10  ADDENDUM: PATHOLOGY ADDENDUM:  Pathology: Invasive mammary carcinoma of no special type.  Pathology concordant with imaging findings: Yes  Recommendation: Surgical consultation.  At the request of the patient, I spoke with her by telephone on 09/03/2014. She reports doing well after the biopsy.  The findings and recommendations were called to Dr. Ammie Ferrier office. Dr. Ammie Ferrier office will contact the patient on 09/04/2014 to arrange for surgical consultation.   Electronically Signed   By: Pamelia Hoit M.D.   On: 09/03/2014 15:10   09/03/2014   CLINICAL DATA:  Ultrasound-guided biopsy of 9 mm mass in the 8 o'clock position of the right breast.  EXAM: DIAGNOSTIC RIGHT MAMMOGRAM POST ULTRASOUND BIOPSY  COMPARISON:  Previous exam(s).  FINDINGS: Mammographic images were obtained following ultrasound guided biopsy of nodes in the right breast 8 o'clock position. Ribbon shaped biopsy clip appears satisfactorily positioned along the posterior margin of the mass. The mass is partially obscured by lidocaine and post biopsy change.  IMPRESSION: Satisfactory position of ribbon shaped biopsy clip.  Final Assessment: Post Procedure Mammograms for Marker Placement  Electronically Signed: By: Curlene Dolphin M.D. On: 08/29/2014 10:47   [place] Rt Breast Bx W Loc Dev 1st Lesion Img Bx Spec US Guide  08/29/2014   CLINICAL DATA:  Mass 8 o'clock position right breast 8 cm from nipple, for which biopsy was recommended. Patient was able to lie on her left side today on the ultrasound table for the  biopsy.  EXAM: ULTRASOUND GUIDED RIGHT BREAST CORE NEEDLE BIOPSY  COMPARISON:  Previous exam(s).  PROCEDURE: I met with the patient and we discussed the procedure of ultrasound-guided biopsy, including benefits and alternatives. We discussed the high likelihood of a successful procedure. We discussed the risks of the procedure including infection, bleeding, tissue injury, clip migration, and inadequate sampling. Informed written consent was given. The usual time-out protocol was performed immediately prior to the procedure.  Using sterile technique and  2% Lidocaine as local anesthetic, under direct ultrasound visualization, a 12 gauge vacuum-assisted device was used to perform biopsy of a 9 mm mass in the 8 o'clock position of the right breast 8 cm from the nipple using a lateral to meet approach. At the conclusion of the procedure, a ribbon shaped tissue marker clip was deployed into the biopsy cavity. Follow-up 2-view mammogram was performed and dictated separately.  At the conclusion of the biopsy, ultrasound of the right axilla was performed, per Dr. Raul Del suggestion, as ultrasound the right axilla was technically limited recently due to the patient being in the upright position. Today, ultrasound of the right axilla is performed. The visualized lymph nodes have normal fatty hila and are within normal limits for cortical thickness. No definite lymphadenopathy is identified.  IMPRESSION: Ultrasound-guided biopsy of right breast mass at 8 o'clock position. No apparent complications.   Electronically Signed   By: Curlene Dolphin M.D.   On: 08/29/2014 10:31    ASSESSMENT:  1.  Carcinoma of right breast status post stereotactic biopsy size of the tumor is 9 mm Estrogen and progesterone receptor positive HER-2/neu receptor negative further definitive surgery pending 2.  Morbid obesity with number of medical issues  PLAN:  I had detailed discussion with patient.  Regarding local therapy we discussed possibility  of lumpectomy sentinel lymph node biopsy and radiation therapy Regarding systemic therapy depending on further evaluation whether patient would need chemotherapy or not can be decided.  Patient certainly would need anti-hormonal therapy Following major issues have been discussed. Considering patient's morbid obesity Patient may not be a candidate for regular radiation therapy and may need brachii therapy.  I discussed that with Dr. Donella Stade was willing to see this patient prior to surgery and discuss situation with Dr. Clemetine Marker Mastectomy in this lady may be a major surgery with patient's previous history of complication of cardiac arrest during surgical procedure when patient had cholecystectomy done in the past Breast cancer navigator was present. Case will be discussed in tumor conference Patient expressed understanding and was in agreement with this plan. She also understands that She can call clinic at any time with any questions, concerns, or complaints.    Cancer of right breast   Staging form: Breast, AJCC 7th Edition     Clinical: Stage IA (T1b, N0, cM0(i+)) - Signed by Forest Gleason, MD on 09/15/2014   Forest Gleason, MD   09/15/2014 8:10 PM

## 2014-09-19 ENCOUNTER — Other Ambulatory Visit: Payer: Self-pay | Admitting: Surgery

## 2014-09-19 ENCOUNTER — Ambulatory Visit
Admission: RE | Admit: 2014-09-19 | Discharge: 2014-09-19 | Disposition: A | Discharge status: Home / Self Care | Payer: PRIVATE HEALTH INSURANCE | Source: Ambulatory Visit | Attending: Radiation Oncology | Admitting: Radiation Oncology

## 2014-09-19 ENCOUNTER — Encounter: Admission: RE | Admit: 2014-09-19 | Payer: PRIVATE HEALTH INSURANCE | Source: Ambulatory Visit

## 2014-09-19 ENCOUNTER — Encounter: Payer: Self-pay | Admitting: Radiation Oncology

## 2014-09-19 VITALS — BP 216/95 | HR 83 | Temp 97.1°F | Resp 20 | Wt 383.4 lb

## 2014-09-19 DIAGNOSIS — C50911 Malignant neoplasm of unspecified site of right female breast: Secondary | ICD-10-CM

## 2014-09-19 NOTE — Consult Note (Signed)
Except an outstanding is perfect of Radiation Oncology NEW PATIENT EVALUATION  Name: Teresa Holland  MRN: [id number]  Date:   09/19/2014     DOB: 04/21/1950   This 64 y.o. female patient presents to the clinic for initial evaluation of breast cancer probable stage I (T1 N0 M0) ER/PR positive HER-2/neu negative awaiting wide local excision and sentinel node biopsy.  REFERRING PHYSICIAN: Rusty Aus, MD  CHIEF COMPLAINT:  Chief Complaint  Patient presents with  . Breast Cancer    Pt is here for initial consultation of breast cancer.      DIAGNOSIS: The encounter diagnosis was Malignant neoplasm of female breast, right.   PREVIOUS INVESTIGATIONS:  Mammograms ultrasound reviewed Initial biopsy report reviewed Clinical notes reviewed  HPI: patient is a 64 year old female with morbid obesity obesity presented with an mammogram in 2015 showing a mass in the right breast reimaged in June 2016 showing a 9 x 8 mm mass. Ultrasound showed no lymph node involvement. Lesion was located in the lower outer quadrant the 8:00 position.Stereotactic biopsy was performed and positive for invasive mammary carcinoma ER/PR positive HER-2/neu not overexpressed.she is scheduled tomorrow for wide local excision and sentinel node biopsy. She's been seen by medical oncology based on her morbid obesity was thought that possible accelerated partial breast irradiation may be indicated. She is seen today and is doing well she is rather nervous. She specifically denies breast tenderness cough or bone pain. Patient has had a breast biopsy of the left breast which was benign in 1998.patient does have a past medical history with complications of cardiac arrest with prior surgical procedure.  PLANNED TREATMENT REGIMEN: accelerated partial breast radiation to the right breast  PAST MEDICAL HISTORY:  has a past medical history of Complication of anesthesia; Hypertension; Sleep apnea; Asthma; Hypothyroidism; Diabetes  mellitus without complication; Depression; Anxiety; GERD (gastroesophageal reflux disease); Headache; Arthritis; Cancer; Gout; Pain; and CHF (congestive heart failure).    PAST SURGICAL HISTORY:  Past Surgical History  Procedure Laterality Date  . Breast biopsy Left 2004    negative  . Cholecystectomy    . Abdominal hysterectomy    . Oophorectomy    . Ankle fracture surgery    . Back surgery      lumbar fusion    FAMILY HISTORY: family history includes Breast cancer in her paternal aunt.  SOCIAL HISTORY:  reports that she has been smoking E-cigarettes.  She has never used smokeless tobacco. She reports that she drinks alcohol. She reports that she does not use illicit drugs.  ALLERGIES: Aspirin; Meloxicam; Nsaids; and Furosemide  MEDICATIONS:  Current Outpatient Prescriptions  Medication Sig Dispense Refill  . albuterol (VENTOLIN HFA) 108 (90 BASE) MCG/ACT inhaler Inhale into the lungs.    . Cholecalciferol (VITAMIN D3) 2000 UNITS capsule Take by mouth.    . diphenhydrAMINE (BENADRYL) 25 mg capsule Take by mouth.    . enalapril (VASOTEC) 20 MG tablet Take by mouth.    . furosemide (LASIX) 20 MG tablet Take by mouth.    Marland Kitchen glimepiride (AMARYL) 2 MG tablet Take by mouth.    Marland Kitchen guaiFENesin (MUCINEX) 600 MG 12 hr tablet Take by mouth 2 (two) times daily as needed.    . hydrochlorothiazide (HYDRODIURIL) 25 MG tablet Take by mouth.    Marland Kitchen HYDROcodone-acetaminophen (NORCO/VICODIN) 5-325 MG per tablet Take by mouth.    . levothyroxine (SYNTHROID, LEVOTHROID) 75 MCG tablet Take by mouth.    . Linaclotide (LINZESS) 145 MCG CAPS capsule bid    .  magnesium oxide (MAG-OX) 400 MG tablet TAKE ONE TABLET BY MOUTH TWICE DAILY    . metoprolol (LOPRESSOR) 50 MG tablet Take by mouth.    . pantoprazole (PROTONIX) 40 MG tablet Take by mouth.    . Phenylephrine-Acetaminophen 5-325 MG TABS Take by mouth as needed.    . polyethylene glycol (MIRALAX / GLYCOLAX) packet Take by mouth.    . potassium chloride  (K-DUR) 10 MEQ tablet Take by mouth.    . simvastatin (ZOCOR) 80 MG tablet Take by mouth.     No current facility-administered medications for this encounter.    ECOG PERFORMANCE STATUS:  0 - Asymptomatic  REVIEW OF SYSTEMS:  Patient denies any weight loss, fatigue, weakness, fever, chills or night sweats. Patient denies any loss of vision, blurred vision. Patient denies any ringing  of the ears or hearing loss. No irregular heartbeat. Patient denies heart murmur or history of fainting. Patient denies any chest pain or pain radiating to her upper extremities. Patient denies any shortness of breath, difficulty breathing at night, cough or hemoptysis. Patient denies any swelling in the lower legs. Patient denies any nausea vomiting, vomiting of blood, or coffee ground material in the vomitus. Patient denies any stomach pain. Patient states has had normal bowel movements no significant constipation or diarrhea. Patient denies any dysuria, hematuria or significant nocturia. Patient denies any problems walking, swelling in the joints or loss of balance. Patient denies any skin changes, loss of hair or loss of weight. Patient denies any excessive worrying or anxiety or significant depression. Patient denies any problems with insomnia. Patient denies excessive thirst, polyuria, polydipsia. Patient denies any swollen glands, patient denies easy bruising or easy bleeding. Patient denies any recent infections, allergies or URI. Patient "s visual fields have not changed significantly in recent time.    PHYSICAL EXAM: BP 216/95 mmHg  Pulse 83  Temp(Src) 97.1 F (36.2 C)  Resp 20  Wt 383 lb 6.1 oz (173.9 kg) Morbidly obese female in NAD. Nodominant mass or nodularity is noted in either breast in 2 positions examined. No axillary or supraclavicular adenopathy is appreciated. Well-developed well-nourished patient in NAD. HEENT reveals PERLA, EOMI, discs not visualized.  Oral cavity is clear. No oral mucosal  lesions are identified. Neck is clear without evidence of cervical or supraclavicular adenopathy. Lungs are clear to A&P. Cardiac examination is essentially unremarkable with regular rate and rhythm without murmur rub or thrill. Abdomen is benign with no organomegaly or masses noted. Motor sensory and DTR levels are equal and symmetric in the upper and lower extremities. Cranial nerves II through XII are grossly intact. Proprioception is intact. No peripheral adenopathy or edema is identified. No motor or sensory levels are noted. Crude visual fields are within normal range.   LABORATORY DATA: surgical pathology biopsy report is reviewed and compatible with the above-stated findings    RADIOLOGY RESULTS:mammograms and ultrasound are reviewed   IMPRESSION: probable early stage I invasive mammary carcinoma of the right breast for wide local excision and sentinel node biopsy tomorrow and possible accelerated partial breast radiation in the adjuvant setting  PLAN: at this time I agree this patient will be an excellent candidate based on her morbid obesity and extremely large breasts and small tumor size for accelerated partial breast irradiation. I discussed the case personally with both medical oncology and surgeon. Surgeon will try to keep the lumpectomy cavity is deep as possible in the breast and will re-ultrasound her about a week out to see the definitive lumpectomy  cavity and plan on possible MammoSite balloon catheter placement. I've explained the risks and benefits of both whole breast radiation as well as accelerated partial breast irradiation to the patient and her significant other. Risks of treatment including skin reaction, fatigue, permanent thickening of the lumpectomy site, all were discussed in detail with the patient. She seems to comprehend my treatment plan well. Patient also be candidate for aromatase inhibitor therapy after completion of radiation.  I would like to take this  opportunity for allowing me to participate in the care of your patient.Armstead Peaks., MD

## 2014-09-20 ENCOUNTER — Ambulatory Visit: Payer: PRIVATE HEALTH INSURANCE

## 2014-09-20 ENCOUNTER — Ambulatory Visit
Admission: RE | Admit: 2014-09-20 | Discharge: 2014-09-20 | Disposition: A | Discharge status: Home / Self Care | Payer: PRIVATE HEALTH INSURANCE | Source: Ambulatory Visit | Attending: Surgery | Admitting: Surgery

## 2014-09-20 ENCOUNTER — Ambulatory Visit: Payer: PRIVATE HEALTH INSURANCE | Admitting: Anesthesiology

## 2014-09-20 ENCOUNTER — Other Ambulatory Visit: Payer: Self-pay | Admitting: Surgery

## 2014-09-20 ENCOUNTER — Ambulatory Visit
Admission: RE | Admit: 2014-09-20 | Discharge: 2014-09-20 | Disposition: A | Discharge status: Home / Self Care | Payer: PRIVATE HEALTH INSURANCE | Source: Ambulatory Visit | Attending: Internal Medicine | Admitting: Internal Medicine

## 2014-09-20 ENCOUNTER — Encounter: Payer: Self-pay | Admitting: *Deleted

## 2014-09-20 ENCOUNTER — Encounter
Admission: RE | Disposition: A | Discharge status: Home / Self Care | Payer: Self-pay | Source: Ambulatory Visit | Attending: Surgery

## 2014-09-20 DIAGNOSIS — Z170 Estrogen receptor positive status [ER+]: Secondary | ICD-10-CM | POA: Insufficient documentation

## 2014-09-20 DIAGNOSIS — K449 Diaphragmatic hernia without obstruction or gangrene: Secondary | ICD-10-CM | POA: Insufficient documentation

## 2014-09-20 DIAGNOSIS — J449 Chronic obstructive pulmonary disease, unspecified: Secondary | ICD-10-CM | POA: Diagnosis not present

## 2014-09-20 DIAGNOSIS — Z888 Allergy status to other drugs, medicaments and biological substances status: Secondary | ICD-10-CM | POA: Insufficient documentation

## 2014-09-20 DIAGNOSIS — Z801 Family history of malignant neoplasm of trachea, bronchus and lung: Secondary | ICD-10-CM | POA: Diagnosis not present

## 2014-09-20 DIAGNOSIS — M549 Dorsalgia, unspecified: Secondary | ICD-10-CM | POA: Insufficient documentation

## 2014-09-20 DIAGNOSIS — Z833 Family history of diabetes mellitus: Secondary | ICD-10-CM | POA: Diagnosis not present

## 2014-09-20 DIAGNOSIS — Z79899 Other long term (current) drug therapy: Secondary | ICD-10-CM | POA: Insufficient documentation

## 2014-09-20 DIAGNOSIS — G8929 Other chronic pain: Secondary | ICD-10-CM | POA: Insufficient documentation

## 2014-09-20 DIAGNOSIS — Z90710 Acquired absence of both cervix and uterus: Secondary | ICD-10-CM | POA: Diagnosis not present

## 2014-09-20 DIAGNOSIS — K219 Gastro-esophageal reflux disease without esophagitis: Secondary | ICD-10-CM | POA: Insufficient documentation

## 2014-09-20 DIAGNOSIS — C50911 Malignant neoplasm of unspecified site of right female breast: Secondary | ICD-10-CM

## 2014-09-20 DIAGNOSIS — Z8249 Family history of ischemic heart disease and other diseases of the circulatory system: Secondary | ICD-10-CM | POA: Insufficient documentation

## 2014-09-20 DIAGNOSIS — E039 Hypothyroidism, unspecified: Secondary | ICD-10-CM | POA: Diagnosis not present

## 2014-09-20 DIAGNOSIS — M1990 Unspecified osteoarthritis, unspecified site: Secondary | ICD-10-CM | POA: Insufficient documentation

## 2014-09-20 DIAGNOSIS — I10 Essential (primary) hypertension: Secondary | ICD-10-CM | POA: Diagnosis not present

## 2014-09-20 DIAGNOSIS — Z8371 Family history of colonic polyps: Secondary | ICD-10-CM | POA: Diagnosis not present

## 2014-09-20 DIAGNOSIS — Z886 Allergy status to analgesic agent status: Secondary | ICD-10-CM | POA: Insufficient documentation

## 2014-09-20 DIAGNOSIS — Z800 Family history of malignant neoplasm of digestive organs: Secondary | ICD-10-CM | POA: Insufficient documentation

## 2014-09-20 DIAGNOSIS — Z7951 Long term (current) use of inhaled steroids: Secondary | ICD-10-CM | POA: Diagnosis not present

## 2014-09-20 DIAGNOSIS — E119 Type 2 diabetes mellitus without complications: Secondary | ICD-10-CM | POA: Diagnosis not present

## 2014-09-20 DIAGNOSIS — Z9049 Acquired absence of other specified parts of digestive tract: Secondary | ICD-10-CM | POA: Diagnosis not present

## 2014-09-20 DIAGNOSIS — Z87891 Personal history of nicotine dependence: Secondary | ICD-10-CM | POA: Diagnosis not present

## 2014-09-20 DIAGNOSIS — J45909 Unspecified asthma, uncomplicated: Secondary | ICD-10-CM | POA: Insufficient documentation

## 2014-09-20 DIAGNOSIS — I5032 Chronic diastolic (congestive) heart failure: Secondary | ICD-10-CM | POA: Insufficient documentation

## 2014-09-20 DIAGNOSIS — C50111 Malignant neoplasm of central portion of right female breast: Secondary | ICD-10-CM | POA: Diagnosis not present

## 2014-09-20 DIAGNOSIS — Z884 Allergy status to anesthetic agent status: Secondary | ICD-10-CM | POA: Diagnosis not present

## 2014-09-20 DIAGNOSIS — Z6841 Body Mass Index (BMI) 40.0 and over, adult: Secondary | ICD-10-CM | POA: Diagnosis not present

## 2014-09-20 DIAGNOSIS — M109 Gout, unspecified: Secondary | ICD-10-CM | POA: Insufficient documentation

## 2014-09-20 DIAGNOSIS — C50511 Malignant neoplasm of lower-outer quadrant of right female breast: Secondary | ICD-10-CM | POA: Insufficient documentation

## 2014-09-20 DIAGNOSIS — Z8489 Family history of other specified conditions: Secondary | ICD-10-CM | POA: Diagnosis not present

## 2014-09-20 DIAGNOSIS — Z836 Family history of other diseases of the respiratory system: Secondary | ICD-10-CM | POA: Insufficient documentation

## 2014-09-20 HISTORY — PX: SENTINEL NODE BIOPSY: SHX6608

## 2014-09-20 HISTORY — PX: PARTIAL MASTECTOMY WITH NEEDLE LOCALIZATION: SHX6008

## 2014-09-20 LAB — GLUCOSE, CAPILLARY: GLUCOSE-CAPILLARY: 186 mg/dL — AB (ref 65–99)

## 2014-09-20 SURGERY — PARTIAL MASTECTOMY WITH NEEDLE LOCALIZATION
Anesthesia: General | Laterality: Right

## 2014-09-20 MED ORDER — HYDROCODONE-ACETAMINOPHEN 5-325 MG PO TABS
1.0000 | ORAL_TABLET | ORAL | Status: DC | PRN
  Administered 2014-09-20: 1 via ORAL

## 2014-09-20 MED ORDER — FENTANYL CITRATE (PF) 100 MCG/2ML IJ SOLN
INTRAMUSCULAR | Status: AC
  Administered 2014-09-20: 50 ug via INTRAVENOUS
  Filled 2014-09-20: qty 2

## 2014-09-20 MED ORDER — IPRATROPIUM-ALBUTEROL 0.5-2.5 (3) MG/3ML IN SOLN
RESPIRATORY_TRACT | Status: AC
  Filled 2014-09-20: qty 3

## 2014-09-20 MED ORDER — IPRATROPIUM-ALBUTEROL 0.5-2.5 (3) MG/3ML IN SOLN: 3.0000 mL | Freq: Once | RESPIRATORY_TRACT | Status: DC

## 2014-09-20 MED ORDER — EPHEDRINE SULFATE 50 MG/ML IJ SOLN
INTRAMUSCULAR | Status: DC | PRN
  Administered 2014-09-20: 5 mg via INTRAVENOUS

## 2014-09-20 MED ORDER — LIDOCAINE HCL (CARDIAC) 20 MG/ML IV SOLN
INTRAVENOUS | Status: DC | PRN
  Administered 2014-09-20: 100 mg via INTRAVENOUS

## 2014-09-20 MED ORDER — MIDAZOLAM HCL 2 MG/2ML IJ SOLN
INTRAMUSCULAR | Status: DC | PRN
  Administered 2014-09-20: 2 mg via INTRAVENOUS

## 2014-09-20 MED ORDER — OXYCODONE HCL 5 MG PO TABS
ORAL_TABLET | ORAL | Status: AC
  Filled 2014-09-20: qty 1

## 2014-09-20 MED ORDER — SODIUM CHLORIDE 0.9 % IV SOLN
INTRAVENOUS | Status: DC | PRN
  Administered 2014-09-20: 11:00:00 via INTRAVENOUS

## 2014-09-20 MED ORDER — TECHNETIUM TC 99M SULFUR COLLOID
1.0500 | Freq: Once | INTRAVENOUS | Status: AC | PRN
  Administered 2014-09-20: 1.05 via INTRAVENOUS

## 2014-09-20 MED ORDER — ALBUTEROL SULFATE HFA 108 (90 BASE) MCG/ACT IN AERS
INHALATION_SPRAY | RESPIRATORY_TRACT | Status: DC | PRN
  Administered 2014-09-20 (×3): 6 via RESPIRATORY_TRACT

## 2014-09-20 MED ORDER — PHENYLEPHRINE 8 MG IN D5W 100 ML (0.08MG/ML) PREMIX OPTIME
INJECTION | INTRAVENOUS | Status: DC | PRN
  Administered 2014-09-20: 50 ug/min via INTRAVENOUS

## 2014-09-20 MED ORDER — ROCURONIUM BROMIDE 100 MG/10ML IV SOLN
INTRAVENOUS | Status: DC | PRN
  Administered 2014-09-20 (×3): 10 mg via INTRAVENOUS
  Administered 2014-09-20: 30 mg via INTRAVENOUS
  Administered 2014-09-20 (×2): 10 mg via INTRAVENOUS

## 2014-09-20 MED ORDER — PROPOFOL 10 MG/ML IV BOLUS
INTRAVENOUS | Status: DC | PRN
  Administered 2014-09-20: 150 mg via INTRAVENOUS

## 2014-09-20 MED ORDER — FENTANYL CITRATE (PF) 100 MCG/2ML IJ SOLN
25.0000 ug | INTRAMUSCULAR | Status: DC | PRN
  Administered 2014-09-20 (×3): 50 ug via INTRAVENOUS

## 2014-09-20 MED ORDER — ACETAMINOPHEN 10 MG/ML IV SOLN
INTRAVENOUS | Status: DC | PRN
  Administered 2014-09-20: 1000 mg via INTRAVENOUS

## 2014-09-20 MED ORDER — BUPIVACAINE-EPINEPHRINE 0.5% -1:200000 IJ SOLN
INTRAMUSCULAR | Status: DC | PRN
  Administered 2014-09-20: 27 mL

## 2014-09-20 MED ORDER — OXYCODONE HCL 5 MG/5ML PO SOLN: 5.0000 mg | Freq: Once | ORAL | Status: AC | PRN

## 2014-09-20 MED ORDER — HYDROCODONE-ACETAMINOPHEN 5-325 MG PO TABS: 1.0000 | ORAL_TABLET | ORAL | Status: DC | PRN

## 2014-09-20 MED ORDER — SUCCINYLCHOLINE CHLORIDE 20 MG/ML IJ SOLN
INTRAMUSCULAR | Status: DC | PRN
  Administered 2014-09-20: 140 mg via INTRAVENOUS

## 2014-09-20 MED ORDER — IPRATROPIUM-ALBUTEROL 0.5-2.5 (3) MG/3ML IN SOLN: 3.0000 mL | Freq: Once | RESPIRATORY_TRACT | Status: DC | PRN

## 2014-09-20 MED ORDER — FENTANYL CITRATE (PF) 100 MCG/2ML IJ SOLN
INTRAMUSCULAR | Status: DC | PRN
  Administered 2014-09-20 (×4): 50 ug via INTRAVENOUS

## 2014-09-20 MED ORDER — ONDANSETRON HCL 4 MG/2ML IJ SOLN
INTRAMUSCULAR | Status: DC | PRN
  Administered 2014-09-20: 4 mg via INTRAVENOUS

## 2014-09-20 MED ORDER — SUGAMMADEX SODIUM 200 MG/2ML IV SOLN
INTRAVENOUS | Status: DC | PRN
  Administered 2014-09-20: 350 mg via INTRAVENOUS

## 2014-09-20 MED ORDER — BUPIVACAINE-EPINEPHRINE (PF) 0.5% -1:200000 IJ SOLN
INTRAMUSCULAR | Status: AC
  Filled 2014-09-20: qty 30

## 2014-09-20 MED ORDER — ACETAMINOPHEN 10 MG/ML IV SOLN
INTRAVENOUS | Status: AC
  Filled 2014-09-20: qty 100

## 2014-09-20 MED ORDER — HYDROCODONE-ACETAMINOPHEN 5-325 MG PO TABS
ORAL_TABLET | ORAL | Status: AC
  Administered 2014-09-20: 1 via ORAL
  Filled 2014-09-20: qty 1

## 2014-09-20 MED ORDER — OXYCODONE HCL 5 MG PO TABS
5.0000 mg | ORAL_TABLET | Freq: Once | ORAL | Status: AC | PRN
  Administered 2014-09-20: 5 mg via ORAL

## 2014-09-20 MED ORDER — PHENYLEPHRINE HCL 10 MG/ML IJ SOLN
INTRAMUSCULAR | Status: DC | PRN
  Administered 2014-09-20: 200 ug via INTRAVENOUS
  Administered 2014-09-20: 100 ug via INTRAVENOUS
  Administered 2014-09-20: 200 ug via INTRAVENOUS
  Administered 2014-09-20: 100 ug via INTRAVENOUS

## 2014-09-20 SURGICAL SUPPLY — 28 items
BLADE SURG 15 STRL LF DISP TIS (BLADE) ×1 IMPLANT
BLADE SURG 15 STRL SS (BLADE) ×1
CANISTER SUCT 1200ML W/VALVE (MISCELLANEOUS) ×2 IMPLANT
CHLORAPREP W/TINT 26ML (MISCELLANEOUS) ×2 IMPLANT
DEVICE DUBIN SPECIMEN MAMMOGRA (MISCELLANEOUS) ×6 IMPLANT
DRAPE PED LAPAROTOMY (DRAPES) ×2 IMPLANT
GLOVE BIO SURGEON STRL SZ7.5 (GLOVE) ×2 IMPLANT
GOWN STRL REUS W/ TWL LRG LVL3 (GOWN DISPOSABLE) ×2 IMPLANT
GOWN STRL REUS W/TWL LRG LVL3 (GOWN DISPOSABLE) ×2
KIT RM TURNOVER STRD PROC AR (KITS) ×2 IMPLANT
LABEL OR SOLS (LABEL) ×2 IMPLANT
LIQUID BAND (GAUZE/BANDAGES/DRESSINGS) ×2 IMPLANT
MARGIN MAP 10MM (MISCELLANEOUS) ×2 IMPLANT
NDL SAFETY 18GX1.5 (NEEDLE) ×2 IMPLANT
NDL SAFETY 22GX1.5 (NEEDLE) ×2 IMPLANT
NDL SAFETY 25GX1.5 (NEEDLE) ×2 IMPLANT
PACK BASIN MINOR ARMC (MISCELLANEOUS) ×2 IMPLANT
PAD GROUND ADULT SPLIT (MISCELLANEOUS) ×2 IMPLANT
SLEVE PROBE SENORX GAMMA FIND (MISCELLANEOUS) ×2 IMPLANT
SUT CHROMIC 3 0 SH 27 (SUTURE) ×2 IMPLANT
SUT CHROMIC 4 0 RB 1X27 (SUTURE) ×2 IMPLANT
SUT ETHILON 3-0 FS-10 30 BLK (SUTURE) ×2
SUT MNCRL 4-0 (SUTURE) ×1
SUT MNCRL 4-0 27XMFL (SUTURE) ×1
SUTURE EHLN 3-0 FS-10 30 BLK (SUTURE) ×1 IMPLANT
SUTURE MNCRL 4-0 27XMF (SUTURE) ×1 IMPLANT
SYRINGE 10CC LL (SYRINGE) ×2 IMPLANT
WATER STERILE IRR 1000ML POUR (IV SOLUTION) ×2 IMPLANT

## 2014-09-20 NOTE — Transfer of Care (Signed)
Immediate Anesthesia Transfer of Care Note  Patient: Teresa Holland  Procedure(s) Performed: Procedure(s): PARTIAL MASTECTOMY WITH NEEDLE LOCALIZATION (Right) SENTINEL NODE BIOPSY (Right)  Patient Location: PACU  Anesthesia Type:General  Level of Consciousness: awake, alert  and oriented  Airway & Oxygen Therapy: Patient Spontanous Breathing and Patient connected to face mask oxygen  Post-op Assessment: Report given to RN and Post -op Vital signs reviewed and stable  Post vital signs: Reviewed and stable  Last Vitals:  Filed Vitals:   09/20/14 1513  BP: 130/79  Pulse: 107  Temp: 37 C  Resp: 28    Complications: No apparent anesthesia complications

## 2014-09-20 NOTE — Anesthesia Preprocedure Evaluation (Addendum)
Anesthesia Evaluation  Patient identified by MRN, date of birth, ID band Patient awake    Reviewed: Allergy & Precautions, H&P , NPO status , Patient's Chart, lab work & pertinent test results, reviewed documented beta blocker date and time   History of Anesthesia Complications (+) history of anesthetic complications  Airway Mallampati: III  TM Distance: >3 FB Neck ROM: limited    Dental  (+) Upper Dentures, Lower Dentures, Poor Dentition, Missing   Pulmonary shortness of breath, asthma , sleep apnea , COPD COPD inhaler, Current Smoker,  breath sounds clear to auscultation  Pulmonary exam normal       Cardiovascular Exercise Tolerance: Poor hypertension, - angina+CHF - CAD, - Past MI and - Orthopnea Normal cardiovascular examRhythm:regular Rate:Normal     Neuro/Psych  Headaches, PSYCHIATRIC DISORDERS Anxiety Depression negative psych ROS   GI/Hepatic Neg liver ROS, GERD-  Controlled and Medicated,  Endo/Other  diabetes, Poorly Controlled, Oral Hypoglycemic AgentsHypothyroidism Morbid obesity  Renal/GU   negative genitourinary   Musculoskeletal  (+) Arthritis -,   Abdominal   Peds  Hematology negative hematology ROS (+)   Anesthesia Other Findings Patient reports that she has obtained medical clearance from her PCP for this procedure  Past Medical History:   Complication of anesthesia                                     Comment:heart issues 2004 during gb surgery had to be               revived   Hypertension                                                 Sleep apnea                                                    Comment:could not use cpap uses 1.5 l [place] at night   Asthma                                                       Hypothyroidism                                               Diabetes mellitus without complication                       Depression                                                  Anxiety  GERD (gastroesophageal reflux disease)                       Headache                                                     Arthritis                                                    Cancer                                                         Comment:breast   Gout                                                         Pain                                                           Comment:chronic back   CHF (congestive heart failure)                              Super morbid obesity BMI 59   Reproductive/Obstetrics negative OB ROS                             Anesthesia Physical Anesthesia Plan  ASA: IV  Anesthesia Plan: General ETT   Post-op Pain Management:    Induction:   Airway Management Planned:   Additional Equipment:   Intra-op Plan:   Post-operative Plan:   Informed Consent: I have reviewed the patients History and Physical, chart, labs and discussed the procedure including the risks, benefits and alternatives for the proposed anesthesia with the patient or authorized representative who has indicated his/her understanding and acceptance.   Dental Advisory Given  Plan Discussed with: Anesthesiologist, CRNA and Surgeon  Anesthesia Plan Comments: (Patient reports that she has medical clearance for this procedure from her PCP.  Patient instructed that she is at increased risk for anesthetic complications 2/2 her past medical history.  Patient voiced understanding of these risks.)       Anesthesia Quick Evaluation

## 2014-09-20 NOTE — Discharge Instructions (Signed)
Take Tylenol or Norco if needed for pain.  May shower.  Wear a bra for support.AMBULATORY SURGERY  DISCHARGE INSTRUCTIONS   1) The drugs that you were given will stay in your system until tomorrow so for the next 24 hours you should not:  A) Drive an automobile B) Make any legal decisions C) Drink any alcoholic beverage   2) You may resume regular meals tomorrow.  Today it is better to start with liquids and gradually work up to solid foods.  You may eat anything you prefer, but it is better to start with liquids, then soup and crackers, and gradually work up to solid foods.   3) Please notify your doctor immediately if you have any unusual bleeding, trouble breathing, redness and pain at the surgery site, drainage, fever, or pain not relieved by medication.    4) Additional Instructions:        Please contact your physician with any problems or Same Day Surgery at [phone], Monday through Friday 6 am to 4 pm, or [place] at [facility] number at [phone].

## 2014-09-20 NOTE — H&P (Signed)
  She reports no change in overall condition since the day of the office visit.  I have discussed her cancer treatment with Dr. Oliva Bustard.  I have also discussed this with Dr. Baruch Gouty  X-rays demonstrating the Kopan's wire and the biopsy marker were reviewed.  I discussed the plan for surgery and perioperative care.  Also discussed anticipation of radiation therapy either external beam or MammoSite therapy. Also discussed the plan for pathology and follow-up

## 2014-09-20 NOTE — OR Nursing (Signed)
Dr Tamala Julian in.  Oxygen at 2/l [place] removed.  Room air sat after no 02 for 15 minutes is 96%.  Instructed patient to wear oxygen tonight.

## 2014-09-20 NOTE — Op Note (Signed)
OPERATIVE REPORT  PREOPERATIVE  DIAGNOSIS: . Breast cancer  POSTOPERATIVE DIAGNOSIS: . Breast cancer  PROCEDURE: . Right partial mastectomy with axillary sentinel lymph node excision  ANESTHESIA:  General  SURGEON: Rochel Brome  MD   INDICATIONS: . She recently had a mammogram depicting a density in the lower outer quadrant of the right breast. Ultrasound demonstrated a 9 mm nodule. Ultrasound-guided core needle biopsy demonstrated infiltrating mammary carcinoma. Surgery was recommended for definitive treatment. She did have preoperative insertion of Kopan's wire at the 8:00 position of the right breast. Mammogram images were reviewed prior to incision. She also had injection of radioactive technetium sulfur colloid.  With the patient on the operating table in the supine position under general anesthesia the right arm was placed on a lateral arm support. The dressing was removed from the lower outer aspect of the right breast exposing the Kopan's wire which was cut 2 cm from the skin. The breast and axilla were prepared with ChloraPrep and draped in a sterile manner. A curvilinear incision was made in the lower outer aspect of the right breast from the 6:30 position to the 8:30 position and did remove a narrow ellipse of skin with the specimen she was carried down through subcutaneous tissues and excised a portion of tissue surrounding the wire extending down some 5 cm deep from the skin. The specimen was tagged with margin maps marked in the cranial caudal medial and deep margins. It appeared that the caudal and cranial margins may be close the cranial margin was reexcised placing the new margin on Telfa. The caudal margin was reexcised placing the new margin on Telfa. These were sent separately for pathology. The rest specimen was also submitted for specimen mammogram and pathology to check for margins. The wound was inspected and determined hemostasis was intact  The attention was turned to the  axilla which was probed with a gamma counter demonstrating the location of radioactivity in the inferior aspect of the axilla. An oblique incision was made in the inferior aspect of the axilla and carried down through subcutaneous tissues. Numerous small bleeding points are cauterized and the dissection was carried down through superficial fascia deeply within the axilla adjacent to the rib cage demonstrating the location of radioactivity. There was a large amount of fat in the operative field and used 2 self-retaining retractors [place] [employer].. A lymph node was encountered and was removed with surrounding fatty tissue and excised. The specimen was examined and it appeared that there were 2 lymph nodes within the specimen 1 lymph node had a ex vivo 1 second count in the range of 180-200 the other lymph node had a ex vivo count in the range of 35-40. These were submitted as sentinel lymph nodes for routine pathology. The background count was less than 10 counts per second. No remaining palpable mass within the axilla. The wound was inspected and determined hemostasis was intact  The radiologist did call to indicate that the specimen mammogram demonstrated the presence of the biopsy marker and the nodule. Solid is called to report that the surgical margins appeared to be satisfactory.   The breast wound was further inspected and infiltrated tissues surrounding cautery artifact with half percent Sensorcaine with epinephrine also infiltrated subcutaneous tissues. The subcutaneous tissues were closed with a combination of 4-0 and 3-0 chromic sutures. The skin was closed with running 4-0 Monocryl subcuticular suture.  The axillary wound was inspected and determined hemostasis was intact. Subcutaneous tissues were infiltrated with half percent  Sensorcaine with epinephrine. The skin was closed with running 4-0 Monocryl subcuticular suture. The axillary wound was treated with LiquiBand and allowed to dry  following this since the breast was retracted an upward direction the partial mastectomy wound was treated with LiquiBand and allowed to dry. The patient tolerated the procedure satisfactorily and was prepared for transfer to the recovery room  This operation was significantly more difficult than the typical operation due to morbid obesity and took more than 2 hours to accomplish.  Rochel Brome M.D.

## 2014-09-21 NOTE — Anesthesia Postprocedure Evaluation (Signed)
  Anesthesia Post-op Note  Patient: Teresa Holland  Procedure(s) Performed: Procedure(s): PARTIAL MASTECTOMY WITH NEEDLE LOCALIZATION (Right) SENTINEL NODE BIOPSY (Right)  Anesthesia type:General ETT  Patient location: PACU  Post pain: Pain level controlled  Post assessment: Post-op Vital signs reviewed, Patient's Cardiovascular Status Stable, Respiratory Function Stable, Patent Airway and No signs of Nausea or vomiting  Post vital signs: Reviewed and stable  Last Vitals:  Filed Vitals:   09/20/14 1642  BP: 147/71  Pulse: 94  Temp: 36 C  Resp: 24    Level of consciousness: awake, alert  and patient cooperative  Complications: No apparent anesthesia complications

## 2014-09-23 ENCOUNTER — Encounter: Payer: Self-pay | Admitting: Surgery

## 2014-09-24 LAB — SURGICAL PATHOLOGY

## 2014-09-25 ENCOUNTER — Telehealth: Payer: Self-pay

## 2014-09-25 NOTE — Telephone Encounter (Signed)
Phoned patient as post surgery follow-up.  No answer.  Reminded her to call if she has any navigation needs.

## 2014-10-02 ENCOUNTER — Other Ambulatory Visit: Payer: Self-pay | Admitting: Surgery

## 2014-10-02 DIAGNOSIS — C50411 Malignant neoplasm of upper-outer quadrant of right female breast: Secondary | ICD-10-CM

## 2014-10-03 ENCOUNTER — Ambulatory Visit
Admission: RE | Admit: 2014-10-03 | Discharge: 2014-10-03 | Disposition: A | Discharge status: Home / Self Care | Payer: PRIVATE HEALTH INSURANCE | Source: Ambulatory Visit | Attending: Surgery | Admitting: Surgery

## 2014-10-03 DIAGNOSIS — C50411 Malignant neoplasm of upper-outer quadrant of right female breast: Secondary | ICD-10-CM

## 2014-10-03 DIAGNOSIS — C50519 Malignant neoplasm of lower-outer quadrant of unspecified female breast: Secondary | ICD-10-CM | POA: Insufficient documentation

## 2014-10-03 DIAGNOSIS — Z9889 Other specified postprocedural states: Secondary | ICD-10-CM | POA: Diagnosis not present

## 2014-10-03 DIAGNOSIS — G4733 Obstructive sleep apnea (adult) (pediatric): Secondary | ICD-10-CM | POA: Insufficient documentation

## 2014-10-04 ENCOUNTER — Other Ambulatory Visit: Payer: Self-pay | Admitting: Surgery

## 2014-10-04 ENCOUNTER — Ambulatory Visit: Payer: PRIVATE HEALTH INSURANCE

## 2014-10-04 DIAGNOSIS — C50511 Malignant neoplasm of lower-outer quadrant of right female breast: Secondary | ICD-10-CM

## 2014-10-08 MED ORDER — HYDROMORPHONE HCL 1 MG/ML IJ SOLN
INTRAMUSCULAR | Status: AC
  Filled 2014-10-08: qty 1

## 2014-10-09 ENCOUNTER — Ambulatory Visit: Payer: PRIVATE HEALTH INSURANCE

## 2014-10-09 ENCOUNTER — Encounter: Payer: Self-pay | Admitting: *Deleted

## 2014-10-09 ENCOUNTER — Inpatient Hospital Stay
Admission: RE | Admit: 2014-10-09 | Discharge: 2014-10-09 | Disposition: A | Discharge status: Home / Self Care | Payer: PRIVATE HEALTH INSURANCE | Source: Ambulatory Visit

## 2014-10-09 NOTE — Patient Instructions (Signed)
  Your procedure is scheduled on: 10-17-14 Report to [place] To find out your arrival time please call [phone] between 1PM - 3PM on 10-16-14 [facility])  Remember: Instructions that are not followed completely may result in serious medical risk, up to and including death, or upon the discretion of your surgeon and anesthesiologist your surgery may need to be rescheduled.    _X___ 1. Do not eat food or drink liquids after midnight. No gum chewing or hard candies.     _X___ 2. No Alcohol for 24 hours before or after surgery.   ____ 3. Bring all medications with you on the day of surgery if instructed.    _X___ 4. Notify your doctor if there is any change in your medical condition     (cold, fever, infections).     Do not wear jewelry, make-up, hairpins, clips or nail polish.  Do not wear lotions, powders, or perfumes. You may wear deodorant.  Do not shave 48 hours prior to surgery. Men may shave face and neck.  Do not bring valuables to the hospital.    [facility] is not responsible for any belongings or valuables.               Contacts, dentures or bridgework may not be worn into surgery.  Leave your suitcase in the car. After surgery it may be brought to your room.  For patients admitted to the hospital, discharge time is determined by your treatment team.   Patients discharged the day of surgery will not be allowed to drive home.   Please read over the following fact sheets that you were given:      __X__ Take these medicines the morning of surgery with A SIP OF WATER:    1. ENALAPRIL (VASOTEC)  2. LEVOTHYROXINE  3. GABAPENTIN  4. MAGNESIUM  5. METOPROLOL  6. PROTONIX             7. TAKE A PROTONIX Wednesday NIGHT  ____ Fleet Enema (as directed)   _X___ Use CHG Soap as directed  _X___ Use inhalers on the day of surgery-USE ALBUTEROL INHALER AND [place]  ____ Stop metformin 2 days prior to surgery    ____ Take 1/2 of  usual insulin dose the night before surgery and none on the morning of surgery.   ____ Stop Coumadin/Plavix/aspirin-N/A  ____ Stop Anti-inflammatories-NO NSAIDS OR ASPIRIN PRODUCTS-TYLENOL OK   ____ Stop supplements until after surgery.    ____ Bring C-Pap to the hospital.

## 2014-10-10 ENCOUNTER — Ambulatory Visit: Payer: PRIVATE HEALTH INSURANCE

## 2014-10-10 ENCOUNTER — Other Ambulatory Visit: Payer: PRIVATE HEALTH INSURANCE

## 2014-10-11 ENCOUNTER — Ambulatory Visit
Admission: RE | Admit: 2014-10-11 | Discharge: 2014-10-11 | Disposition: A | Discharge status: Home / Self Care | Payer: PRIVATE HEALTH INSURANCE | Source: Ambulatory Visit | Attending: Surgery | Admitting: Surgery

## 2014-10-11 DIAGNOSIS — L7621 Postprocedural hemorrhage and hematoma of skin and subcutaneous tissue following a dermatologic procedure: Secondary | ICD-10-CM | POA: Insufficient documentation

## 2014-10-11 DIAGNOSIS — C50511 Malignant neoplasm of lower-outer quadrant of right female breast: Secondary | ICD-10-CM

## 2014-10-14 ENCOUNTER — Other Ambulatory Visit: Payer: PRIVATE HEALTH INSURANCE

## 2014-10-15 ENCOUNTER — Other Ambulatory Visit: Payer: PRIVATE HEALTH INSURANCE

## 2014-10-16 ENCOUNTER — Ambulatory Visit
Admission: RE | Admit: 2014-10-16 | Discharge: 2014-10-16 | Disposition: A | Discharge status: Home / Self Care | Payer: PRIVATE HEALTH INSURANCE | Source: Ambulatory Visit | Attending: Anesthesiology | Admitting: Anesthesiology

## 2014-10-16 ENCOUNTER — Encounter
Admission: RE | Admit: 2014-10-16 | Discharge: 2014-10-16 | Disposition: A | Discharge status: Home / Self Care | Payer: PRIVATE HEALTH INSURANCE | Source: Ambulatory Visit | Attending: Anesthesiology | Admitting: Anesthesiology

## 2014-10-16 DIAGNOSIS — I517 Cardiomegaly: Secondary | ICD-10-CM | POA: Diagnosis not present

## 2014-10-16 DIAGNOSIS — R0602 Shortness of breath: Secondary | ICD-10-CM

## 2014-10-16 HISTORY — DX: Dorsalgia, unspecified: M54.9

## 2014-10-16 HISTORY — DX: Obesity, unspecified: E66.9

## 2014-10-16 HISTORY — DX: Reserved for inherently not codable concepts without codable children: IMO0001

## 2014-10-16 LAB — CBC
HEMATOCRIT: 38.2 % (ref 35.0–47.0)
Hemoglobin: 11.8 g/dL — ABNORMAL LOW (ref 12.0–16.0)
MCH: 25.3 pg — AB (ref 26.0–34.0)
MCHC: 30.9 g/dL — ABNORMAL LOW (ref 32.0–36.0)
MCV: 81.9 fL (ref 80.0–100.0)
PLATELETS: 247 10*3/uL (ref 150–440)
RBC: 4.67 MIL/uL (ref 3.80–5.20)
RDW: 14.9 % — ABNORMAL HIGH (ref 11.5–14.5)
WBC: 10.8 10*3/uL (ref 3.6–11.0)

## 2014-10-16 LAB — DIFFERENTIAL
Basophils Absolute: 0.1 10*3/uL (ref 0–0.1)
Basophils Relative: 1 %
EOS PCT: 2 %
Eosinophils Absolute: 0.2 10*3/uL (ref 0–0.7)
LYMPHS PCT: 19 %
Lymphs Abs: 2 10*3/uL (ref 1.0–3.6)
MONO ABS: 0.9 10*3/uL (ref 0.2–0.9)
MONOS PCT: 9 %
Neutro Abs: 7.6 10*3/uL — ABNORMAL HIGH (ref 1.4–6.5)
Neutrophils Relative %: 69 %

## 2014-10-16 LAB — BASIC METABOLIC PANEL
Anion gap: 13 (ref 5–15)
BUN: 18 mg/dL (ref 6–20)
CHLORIDE: 95 mmol/L — AB (ref 101–111)
CO2: 28 mmol/L (ref 22–32)
CREATININE: 1.2 mg/dL — AB (ref 0.44–1.00)
Calcium: 9.4 mg/dL (ref 8.9–10.3)
GFR calc Af Amer: 55 mL/min — ABNORMAL LOW (ref >60)
GFR calc non Af Amer: 47 mL/min — ABNORMAL LOW (ref >60)
GLUCOSE: 140 mg/dL — AB (ref 65–99)
Potassium: 3.9 mmol/L (ref 3.5–5.1)
SODIUM: 136 mmol/L (ref 135–145)

## 2014-10-16 NOTE — Pre-Procedure Instructions (Signed)
Dr Ronelle Nigh notified regarding patient symptoms.  Surgery cancelled.  Dr Rochel Brome notified regarding patients symptoms and that surgery was cancelled.  Mrs Michelin notified that surgery was cancelled and advised to come to the emergency department is symptoms persist or worsened.

## 2014-10-17 ENCOUNTER — Encounter: Admission: RE | Payer: Self-pay | Source: Ambulatory Visit

## 2014-10-17 SURGERY — BREAST LUMPECTOMY WITH MAMMOSITE CAVITY EVALUATION DEVICE
Anesthesia: LOCAL | Laterality: Right

## 2014-10-17 MED ORDER — BUPIVACAINE-EPINEPHRINE (PF) 0.5% -1:200000 IJ SOLN
INTRAMUSCULAR | Status: AC
  Filled 2014-10-17: qty 30

## 2014-10-17 MED ORDER — DEXTROSE 5 % IV SOLN
3.0000 g | Freq: Once | INTRAVENOUS | Status: DC
  Filled 2014-10-17: qty 3000

## 2014-10-17 MED ORDER — IOHEXOL 180 MG/ML  SOLN
INTRAMUSCULAR | Status: AC
  Filled 2014-10-17: qty 20

## 2014-10-17 MED ORDER — SODIUM CHLORIDE 0.9 % IJ SOLN
INTRAMUSCULAR | Status: AC
  Filled 2014-10-17: qty 100

## 2014-10-17 MED ORDER — LACTATED RINGERS IV SOLN: INTRAVENOUS | Status: DC

## 2014-10-17 MED ORDER — SODIUM CHLORIDE 0.9 % IJ SOLN
INTRAMUSCULAR | Status: AC
Start: 2014-10-17 — End: 2014-10-17
  Filled 2014-10-17: qty 20

## 2014-10-17 NOTE — Pre-Procedure Instructions (Signed)
Dr. Emily Filbert office notified regarding anesthesia request for medical clearance. Request faxed to [facility] on August 17 by Janett Billow, information given to Roda Shutters

## 2014-10-21 ENCOUNTER — Ambulatory Visit: Payer: PRIVATE HEALTH INSURANCE

## 2014-10-21 NOTE — Pre-Procedure Instructions (Signed)
On 8/17/16Dr Jackqulyn Livings office faxed with request for medical clearance per Dr Kathryne Sharper request.  On 10/17/14 Dr Ammie Ferrier office called regarding need for medical clearance.  Dr Ammie Ferrier office sent patient to see Dr Gigi Gin.

## 2014-10-22 ENCOUNTER — Ambulatory Visit: Payer: PRIVATE HEALTH INSURANCE

## 2014-10-22 ENCOUNTER — Ambulatory Visit
Admission: RE | Admit: 2014-10-22 | Discharge: 2014-10-22 | Disposition: A | Discharge status: Home / Self Care | Payer: PRIVATE HEALTH INSURANCE | Source: Ambulatory Visit | Attending: Surgery | Admitting: Surgery

## 2014-10-22 ENCOUNTER — Other Ambulatory Visit: Payer: Self-pay | Admitting: Surgery

## 2014-10-22 DIAGNOSIS — C50511 Malignant neoplasm of lower-outer quadrant of right female breast: Secondary | ICD-10-CM | POA: Diagnosis present

## 2014-10-23 ENCOUNTER — Ambulatory Visit: Payer: PRIVATE HEALTH INSURANCE

## 2014-10-23 ENCOUNTER — Ambulatory Visit: Admission: RE | Admit: 2014-10-23 | Payer: PRIVATE HEALTH INSURANCE | Source: Ambulatory Visit | Admitting: Surgery

## 2014-10-24 ENCOUNTER — Ambulatory Visit
Admission: RE | Admit: 2014-10-24 | Discharge: 2014-10-24 | Disposition: A | Discharge status: Home / Self Care | Payer: PRIVATE HEALTH INSURANCE | Source: Ambulatory Visit | Attending: Internal Medicine | Admitting: Internal Medicine

## 2014-10-24 ENCOUNTER — Ambulatory Visit: Payer: PRIVATE HEALTH INSURANCE

## 2014-10-24 ENCOUNTER — Other Ambulatory Visit: Payer: Self-pay | Admitting: Internal Medicine

## 2014-10-24 DIAGNOSIS — I2699 Other pulmonary embolism without acute cor pulmonale: Secondary | ICD-10-CM

## 2014-10-24 DIAGNOSIS — R0602 Shortness of breath: Secondary | ICD-10-CM | POA: Diagnosis present

## 2014-10-24 MED ORDER — IOHEXOL 350 MG/ML SOLN
100.0000 mL | Freq: Once | INTRAVENOUS | Status: AC | PRN
  Administered 2014-10-24: 100 mL via INTRAVENOUS

## 2014-10-25 ENCOUNTER — Ambulatory Visit: Payer: PRIVATE HEALTH INSURANCE

## 2014-10-28 ENCOUNTER — Ambulatory Visit: Payer: PRIVATE HEALTH INSURANCE

## 2014-11-07 ENCOUNTER — Other Ambulatory Visit: Payer: Self-pay | Admitting: Surgery

## 2014-11-07 DIAGNOSIS — C50511 Malignant neoplasm of lower-outer quadrant of right female breast: Secondary | ICD-10-CM

## 2014-11-08 ENCOUNTER — Other Ambulatory Visit: Payer: Self-pay | Admitting: Internal Medicine

## 2014-11-08 DIAGNOSIS — R079 Chest pain, unspecified: Secondary | ICD-10-CM

## 2014-11-08 DIAGNOSIS — I509 Heart failure, unspecified: Secondary | ICD-10-CM

## 2014-11-08 DIAGNOSIS — R0602 Shortness of breath: Secondary | ICD-10-CM

## 2014-11-12 ENCOUNTER — Ambulatory Visit
Admission: RE | Admit: 2014-11-12 | Discharge: 2014-11-12 | Disposition: A | Discharge status: Home / Self Care | Payer: PRIVATE HEALTH INSURANCE | Source: Ambulatory Visit | Attending: Surgery | Admitting: Surgery

## 2014-11-12 DIAGNOSIS — C50511 Malignant neoplasm of lower-outer quadrant of right female breast: Secondary | ICD-10-CM

## 2014-11-13 ENCOUNTER — Other Ambulatory Visit: Payer: Self-pay | Admitting: *Deleted

## 2014-11-13 DIAGNOSIS — C50911 Malignant neoplasm of unspecified site of right female breast: Secondary | ICD-10-CM

## 2014-11-14 ENCOUNTER — Encounter: Admission: RE | Admit: 2014-11-14 | Payer: PRIVATE HEALTH INSURANCE | Source: Ambulatory Visit

## 2014-11-21 ENCOUNTER — Inpatient Hospital Stay (HOSPITAL_BASED_OUTPATIENT_CLINIC_OR_DEPARTMENT_OTHER): Payer: PRIVATE HEALTH INSURANCE | Admitting: Oncology

## 2014-11-21 ENCOUNTER — Inpatient Hospital Stay: Payer: PRIVATE HEALTH INSURANCE | Attending: Oncology

## 2014-11-21 VITALS — BP 152/81 | HR 71 | Temp 96.0°F | Wt 385.5 lb

## 2014-11-21 DIAGNOSIS — F329 Major depressive disorder, single episode, unspecified: Secondary | ICD-10-CM

## 2014-11-21 DIAGNOSIS — M129 Arthropathy, unspecified: Secondary | ICD-10-CM | POA: Insufficient documentation

## 2014-11-21 DIAGNOSIS — C50911 Malignant neoplasm of unspecified site of right female breast: Secondary | ICD-10-CM

## 2014-11-21 DIAGNOSIS — E039 Hypothyroidism, unspecified: Secondary | ICD-10-CM

## 2014-11-21 DIAGNOSIS — E119 Type 2 diabetes mellitus without complications: Secondary | ICD-10-CM

## 2014-11-21 DIAGNOSIS — K219 Gastro-esophageal reflux disease without esophagitis: Secondary | ICD-10-CM

## 2014-11-21 DIAGNOSIS — E669 Obesity, unspecified: Secondary | ICD-10-CM

## 2014-11-21 DIAGNOSIS — F17210 Nicotine dependence, cigarettes, uncomplicated: Secondary | ICD-10-CM

## 2014-11-21 DIAGNOSIS — C50511 Malignant neoplasm of lower-outer quadrant of right female breast: Secondary | ICD-10-CM | POA: Insufficient documentation

## 2014-11-21 DIAGNOSIS — Z79811 Long term (current) use of aromatase inhibitors: Secondary | ICD-10-CM | POA: Diagnosis not present

## 2014-11-21 DIAGNOSIS — F419 Anxiety disorder, unspecified: Secondary | ICD-10-CM | POA: Insufficient documentation

## 2014-11-21 DIAGNOSIS — Z170 Estrogen receptor positive status [ER+]: Secondary | ICD-10-CM

## 2014-11-21 DIAGNOSIS — G4730 Sleep apnea, unspecified: Secondary | ICD-10-CM

## 2014-11-21 DIAGNOSIS — M549 Dorsalgia, unspecified: Secondary | ICD-10-CM

## 2014-11-21 DIAGNOSIS — R0602 Shortness of breath: Secondary | ICD-10-CM | POA: Insufficient documentation

## 2014-11-21 DIAGNOSIS — J45909 Unspecified asthma, uncomplicated: Secondary | ICD-10-CM | POA: Insufficient documentation

## 2014-11-21 DIAGNOSIS — Z803 Family history of malignant neoplasm of breast: Secondary | ICD-10-CM | POA: Diagnosis not present

## 2014-11-21 DIAGNOSIS — I10 Essential (primary) hypertension: Secondary | ICD-10-CM | POA: Diagnosis not present

## 2014-11-21 DIAGNOSIS — Z79899 Other long term (current) drug therapy: Secondary | ICD-10-CM | POA: Diagnosis not present

## 2014-11-21 LAB — CBC WITH DIFFERENTIAL/PLATELET
Basophils Absolute: 0.1 10*3/uL (ref 0–0.1)
Basophils Relative: 1 %
EOS PCT: 1 %
Eosinophils Absolute: 0.1 10*3/uL (ref 0–0.7)
HEMATOCRIT: 37.7 % (ref 35.0–47.0)
Hemoglobin: 12 g/dL (ref 12.0–16.0)
LYMPHS ABS: 1.7 10*3/uL (ref 1.0–3.6)
LYMPHS PCT: 16 %
MCH: 25.4 pg — AB (ref 26.0–34.0)
MCHC: 31.9 g/dL — ABNORMAL LOW (ref 32.0–36.0)
MCV: 79.6 fL — AB (ref 80.0–100.0)
MONO ABS: 0.6 10*3/uL (ref 0.2–0.9)
Monocytes Relative: 6 %
Neutro Abs: 8.2 10*3/uL — ABNORMAL HIGH (ref 1.4–6.5)
Neutrophils Relative %: 76 %
PLATELETS: 246 10*3/uL (ref 150–440)
RBC: 4.74 MIL/uL (ref 3.80–5.20)
RDW: 14.3 % (ref 11.5–14.5)
WBC: 10.7 10*3/uL (ref 3.6–11.0)

## 2014-11-21 LAB — COMPREHENSIVE METABOLIC PANEL
ALT: 22 U/L (ref 14–54)
AST: 24 U/L (ref 15–41)
Albumin: 4.1 g/dL (ref 3.5–5.0)
Alkaline Phosphatase: 56 U/L (ref 38–126)
Anion gap: 10 (ref 5–15)
BILIRUBIN TOTAL: 0.6 mg/dL (ref 0.3–1.2)
BUN: 18 mg/dL (ref 6–20)
CHLORIDE: 93 mmol/L — AB (ref 101–111)
CO2: 32 mmol/L (ref 22–32)
CREATININE: 1.2 mg/dL — AB (ref 0.44–1.00)
Calcium: 8.7 mg/dL — ABNORMAL LOW (ref 8.9–10.3)
GFR, EST AFRICAN AMERICAN: 55 mL/min — AB (ref >60)
GFR, EST NON AFRICAN AMERICAN: 47 mL/min — AB (ref >60)
Glucose, Bld: 216 mg/dL — ABNORMAL HIGH (ref 65–99)
POTASSIUM: 4.3 mmol/L (ref 3.5–5.1)
Sodium: 135 mmol/L (ref 135–145)
TOTAL PROTEIN: 8.2 g/dL — AB (ref 6.5–8.1)

## 2014-11-21 MED ORDER — CALCIUM CARBONATE 600 MG PO TABS: 600.0000 mg | ORAL_TABLET | Freq: Two times a day (BID) | ORAL | Status: DC

## 2014-11-21 MED ORDER — LETROZOLE 2.5 MG PO TABS: 2.5000 mg | ORAL_TABLET | Freq: Every day | ORAL | Status: DC

## 2014-11-21 NOTE — Progress Notes (Signed)
Teresa Holland @ [facility] [phone]  [phone]     VARSHINI ARRANTS OB: [birth year]  MR#: [id number]  VQQ#:[id number]  Patient Care Team: Rusty Aus, MD as PCP - General (Internal Medicine) Leonie Green, MD as Referring Physician (Surgery) Noreene Filbert, MD as Referring Physician (Radiation Oncology)  CHIEF COMPLAINT:  Chief Complaint  Patient presents with  . Advice Only    VISIT DIAGNOSIS:  Carcinoma of right breast Oncology History   1.  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  Morbid obesity 3.  Previous left breast biopsy in 1998 negative     Cancer of right breast   09/15/2014 Initial Diagnosis Cancer of right breast    [name] came with stereotactic biopsy of the right upper outer quadrant of carcinoma of breast. Patient is now being evaluated prior to definitive surgical intervention INTERVAL HISTORY: Patient had a bilateral mammogram in November of [address]329 a mass was noted in the right breast.  She had a distal spot compression and further follow-up mammogram in June of 2016 this mass revealed a 9 x 8 x 6 mm mass.  Ultrasound did not reveal any lymph node involvement.  Stereotactic biopsy was positive for invasive mammary carcinoma.  Which was estrogen receptor progesterone receptor positive.  And HER-2/neu receptor negative. Patient underwent stereotactic biopsy and was referred to me prior to surgical evaluation for planning of treatment  November 21, 2014 Patient was supposed to get radiation therapy with MammoSite brachytherapy however patient was not considered a good candidate for surgery.  Because of obesity and multiple medical issues.  Patient also has difficulty lying down as well as because of her weight radiation was seen cannot handle high  weight.  And patient cannot lie down flat for a long enough time patient is here for ongoing evaluation and for consideration of other options of therapy.   REVIEW OF SYSTEMS:   GENERAL:  Feels good.  Active.  No fevers,   Patient is  morbidly obese with weighing 380 pounds PERFORMANCE STATUS (ECOG):  1 HEENT:  No visual changes, runny nose, sore throat, mouth sores or tenderness. Lungs: No shortness of breath or cough.  No hemoptysis. Cardiac:  No chest pain, palpitations, orthopnea, or PND. GI:  No nausea, vomiting, diarrhea, constipation, melena or hematochezia. GU:  No urgency, frequency, dysuria, or hematuria. Musculoskeletal: Neck back pain with multiple back surgery  Extremities:  No pain or swelling. Skin:  No rashes or skin changes. Neuro:  No headache, numbness or weakness, balance or coordination issues. Endocrine:  No diabetes, thyroid issues, hot flashes or night sweats. Psych:  No mood changes, depression or anxiety. Pain:  No focal pain. Review of systems:  All other systems reviewed and found to be negative.  As per HPI. Otherwise, a complete review of systems is negatve.  PAST MEDICAL HISTORY: Past Medical History  Diagnosis Date  . Complication of anesthesia     heart issues 2004 during gb surgery had to be revived  . Hypertension   . Sleep apnea     could not use cpap uses 1.5 l [place] at night  . Asthma   . Hypothyroidism   . Diabetes mellitus without complication   . Depression   . Anxiety   . GERD (gastroesophageal reflux disease)   . Headache   . Arthritis   .  Cancer     breast  . Gout   . Pain     chronic back  . CHF (congestive heart failure)   . Obesity   . Back pain   . Shortness of breath dyspnea     PAST SURGICAL HISTORY: Past Surgical History  Procedure Laterality Date  . Breast biopsy Left 2004    negative  . Cholecystectomy    . Abdominal hysterectomy    . Oophorectomy    . Ankle fracture surgery    . Back surgery      lumbar  fusion  . Partial mastectomy with needle localization Right 09/20/2014    Procedure: PARTIAL MASTECTOMY WITH NEEDLE LOCALIZATION;  Surgeon: Leonie Green, MD;  Location: [facility] ORS;  Service: General;  Laterality: Right;  . Sentinel node biopsy Right 09/20/2014    Procedure: SENTINEL NODE BIOPSY;  Surgeon: Leonie Green, MD;  Location: [facility] ORS;  Service: General;  Laterality: Right;  . Abd pain      FAMILY HISTORY Family History  Problem Relation Age of Onset  . Breast cancer Paternal Aunt     28's       ADVANCED DIRECTIVES:  Patient does not have any living will or healthcare power of attorney.  Information was given .  Available resources had been discussed.  We will follow-up on subsequent appointments regarding this issue  HEALTH MAINTENANCE: Social History  Substance Use Topics  . Smoking status: Current Every Day Smoker    Types: E-cigarettes  . Smokeless tobacco: Never Used  . Alcohol Use: Yes      Allergies  Allergen Reactions  . Aspirin Nausea Only    Stomach burning  . Meloxicam Nausea Only  . Nsaids Nausea Only  . Furosemide Palpitations  . Latex Itching, Rash and Other (See Comments)    SNEEZING  . Tape Rash    Current Outpatient Prescriptions  Medication Sig Dispense Refill  . albuterol (VENTOLIN HFA) 108 (90 BASE) MCG/ACT inhaler Inhale 2 puffs into the lungs 4 (four) times daily.     . Cholecalciferol (VITAMIN D3) 2000 UNITS capsule Take by mouth.    . diphenhydrAMINE (BENADRYL) 25 mg capsule Take 25 mg by mouth as needed.     . enalapril (VASOTEC) 20 MG tablet Take 20 mg by mouth every morning.     . gabapentin (NEURONTIN) 300 MG capsule Take 300 mg by mouth 3 (three) times daily.    Marland Kitchen glimepiride (AMARYL) 2 MG tablet Take 2 mg by mouth daily with breakfast.     . guaiFENesin (MUCINEX) 600 MG 12 hr tablet Take by mouth 2 (two) times daily as needed.    Marland Kitchen HYDROcodone-acetaminophen (NORCO) 5-325 MG per tablet Take 1-2 tablets by mouth every 4  (four) hours as needed for moderate pain. 12 tablet 0  . levothyroxine (SYNTHROID, LEVOTHROID) 75 MCG tablet Take 75 mcg by mouth daily before breakfast.     . Linaclotide (LINZESS) 145 MCG CAPS capsule bid    . magnesium oxide (MAG-OX) 400 MG tablet TAKE ONE TABLET BY MOUTH TWICE DAILY    . metolazone (ZAROXOLYN) 2.5 MG tablet Take by mouth.    . metoprolol (LOPRESSOR) 50 MG tablet Take 50 mg by mouth 2 (two) times daily.     . pantoprazole (PROTONIX) 40 MG tablet Take 40 mg by mouth daily as needed.     Marland Kitchen Phenylephrine-Acetaminophen 5-325 MG TABS Take by mouth as needed.    . polyethylene glycol (MIRALAX / GLYCOLAX) packet Take  17 g by mouth daily as needed.     . potassium chloride (K-DUR) 10 MEQ tablet Take 10 mEq by mouth 4 (four) times daily.     . simvastatin (ZOCOR) 80 MG tablet Take 80 mg by mouth daily at 6 PM.     . torsemide (DEMADEX) 20 MG tablet Take by mouth.    . calcium carbonate (CALCIUM 600) 600 MG TABS tablet Take 1 tablet (600 mg total) by mouth 2 (two) times daily with a meal. 60 tablet 6  . furosemide (LASIX) 20 MG tablet Take 20 mg by mouth 2 (two) times daily.     . hydrochlorothiazide (HYDRODIURIL) 25 MG tablet Take by mouth.    Marland Kitchen HYDROcodone-acetaminophen (NORCO/VICODIN) 5-325 MG per tablet Take 1-2 tablets by mouth every 6 (six) hours as needed.     Marland Kitchen letrozole (FEMARA) 2.5 MG tablet Take 1 tablet (2.5 mg total) by mouth daily. 30 tablet 6   No current facility-administered medications for this visit.    OBJECTIVE: PHYSICAL EXAM: GENERAL:  Well developed, well nourished, sitting comfortably in the exam room in no acute distress. MENTAL STATUS:  Alert and oriented to person, place and time. HEAD:  .  Normocephalic, atraumatic, face symmetric, no Cushingoid features. EYES: .  Pupils equal round and reactive to light and accomodation.  No conjunctivitis or scleral icterus.   RESPIRATORY:  Clear to auscultation without rales, wheezes or rhonchi. CARDIOVASCULAR:   Regular rate and rhythm without murmur, rub or gallop. BREAST:  Right breast without masses, biopsy site has in duration . skin changes or nipple discharge.  Left breast without masses, skin changes or nipple discharge. ABDOMEN:  Soft, non-tender, with active bowel sounds, and no hepatosplenomegaly.  No masses. BACK:  No CVA tenderness.  No tenderness on percussion of the back or rib cage. SKIN:  No rashes, ulcers or lesions. EXTREMITIES: No edema, no skin discoloration or tenderness.  No palpable cords. LYMPH NODES: No palpable cervical, supraclavicular, axillary or inguinal adenopathy  NEUROLOGICAL: Unremarkable. PSYCH:  Appropriate.  Filed Vitals:   11/21/14 1209  BP: 152/81  Pulse: 71  Temp: 96 F (35.6 C)     Body mass index is 60.36 kg/(m^2).    ECOG FS:1 - Symptomatic but completely ambulatory  Pathology report has been reviewed.  All the x-rays were reviewed.:     STUDIES: Ct Angio Chest Pe W/cm &/or Wo Cm  10/24/2014   CLINICAL DATA:  Patient with acute onset shortness of breath. Evaluate for pulmonary embolism.  EXAM: CT ANGIOGRAPHY CHEST WITH CONTRAST  TECHNIQUE: Multidetector CT imaging of the chest was performed using the standard protocol during bolus administration of intravenous contrast. Multiplanar CT image reconstructions and MIPs were obtained to evaluate the vascular anatomy.  CONTRAST:  112m OMNIPAQUE IOHEXOL 350 MG/ML SOLN  COMPARISON:  Chest radiograph 10/16/2014  FINDINGS: Mediastinum/Nodes: No enlarged axillary, mediastinal or hilar lymphadenopathy. Heart is enlarged. No pericardial effusion. Coronary arterial vascular calcifications. Aorta and main pulmonary artery normal in caliber.  Mild motion artifact limits evaluation within the lung bases bilaterally. No evidence for pulmonary embolism.  Lungs/Pleura: Central airways are patent. No consolidative pulmonary opacities. No pleural effusion or pneumothorax.  Upper abdomen: The liver is diffusely low in  attenuation compatible with steatosis.  Musculoskeletal: No aggressive or acute appearing osseous lesions. Within the central aspect of the right breast there is a 6.5 cm fluid collection. Additionally within the right axilla there is a 3.0 cm fluid collection. There is diffuse skin thickening overlying  the right breast.  Review of the MIP images confirms the above findings.  IMPRESSION: No acute pulmonary embolism.  Postsurgical changes within the right breast with a 6.5 cm right breast seroma as well as a 3.0 cm fluid collection within the right axilla.   Electronically Signed   By: Lovey Newcomer M.D.   On: 10/24/2014 17:56   US Breast Ltd Uni Right Inc Axilla  11/12/2014   CLINICAL DATA:  Followup right breast postoperative fluid collection prior to MammoSite catheter placement.  EXAM: ULTRASOUND OF THE RIGHT BREAST  COMPARISON:  Previous exam(s).  FINDINGS: Physical examination demonstrates is persistent swelling involving the inferior portion of the right breast. No palpable masses in the lower outer right breast.  Targeted ultrasound of the right breast at 7 o'clock was performed. The fluid collection has decreased in size and now measures 2.3 x 1 x 5 cm, previously 8.5 x 4.2 x 7.1 cm. This was difficult to measure given patient's body habitus and extreme difficulty with movement/positioning. There is a large amount of skin and subcutaneous edema as well.  IMPRESSION: Right breast postoperative fluid collection now measures approximately 2.3 x 1 x 5 cm.  RECOMMENDATION: 1. Treatment plan for right breast.  2.  Bilateral diagnostic mammography November 2016.  I have discussed the findings and recommendations with the patient. Results were also provided in writing at the conclusion of the visit. If applicable, a reminder letter will be sent to the patient regarding the next appointment.  BI-RADS CATEGORY  2: Benign.   Electronically Signed   By: Everlean Alstrom M.D.   On: 11/12/2014 13:44    ASSESSMENT:    1.  Carcinoma of right breast status post stereotactic biopsy size of the tumor is 9 mm Estrogen and progesterone receptor positive HER-2/neu receptor negative further definitive surgery pending 2.  Morbid obesity with number of medical issues  PLAN:  Very difficult situation regarding local treatment.  Patient cannot be put under anesthesia for the mamosite  catheter placement. Patient may not able to lie down for long time for continuation of radiation therapy I had prolonged discussion with patient and family about further local treatment and anti-hormonal therapy will proceed with letrozole vitamin D calcium and risk of osteoporosis discussed.  We will make an appointment for Dr. Donella Stade, radiation oncologist for possibility of consideration of whole breast radiation therapy I discussed situation with Dr. Donella Stade Meanwhile patient was started from air and if radiation therapy started the ninth [place] be put on hold. Total duration of visit was30 minutes.  50% or more time was spent in counseling patient and family regarding prognosis and options of treatment and available resources  All other options without radiation therapy.  Difficulty getting radiation therapy done and initiation of anti-hormonal therapy had been discussed with the patient and family that had number of questions which were answered Patient expressed understanding and was in agreement with this plan. She also understands that She can call clinic at any time with any questions, concerns, or complaints.    Cancer of right breast   Staging form: Breast, AJCC 7th Edition     Clinical: Stage IA (T1b, N0, cM0(i+)) - Signed by Forest Gleason, MD on 09/15/2014   Forest Gleason, MD   11/23/2014 1:23 PM

## 2014-11-21 NOTE — Progress Notes (Signed)
Patient does not have living will.  Currently smokes e-cigarettes. Patient here today to discuss treatment options with MD.

## 2014-11-23 ENCOUNTER — Encounter: Payer: Self-pay | Admitting: Oncology

## 2014-11-28 ENCOUNTER — Encounter
Admission: RE | Admit: 2014-11-28 | Discharge: 2014-11-28 | Disposition: A | Discharge status: Home / Self Care | Payer: PRIVATE HEALTH INSURANCE | Source: Ambulatory Visit | Attending: Internal Medicine | Admitting: Internal Medicine

## 2014-11-28 DIAGNOSIS — R079 Chest pain, unspecified: Secondary | ICD-10-CM | POA: Insufficient documentation

## 2014-11-28 DIAGNOSIS — I509 Heart failure, unspecified: Secondary | ICD-10-CM | POA: Insufficient documentation

## 2014-11-28 DIAGNOSIS — R0602 Shortness of breath: Secondary | ICD-10-CM | POA: Insufficient documentation

## 2014-12-05 ENCOUNTER — Institutional Professional Consult (permissible substitution): Payer: PRIVATE HEALTH INSURANCE | Admitting: Radiation Oncology

## 2014-12-05 ENCOUNTER — Ambulatory Visit: Payer: PRIVATE HEALTH INSURANCE | Admitting: Radiation Oncology

## 2014-12-13 ENCOUNTER — Ambulatory Visit
Admission: RE | Admit: 2014-12-13 | Discharge: 2014-12-13 | Disposition: A | Discharge status: Home / Self Care | Payer: PRIVATE HEALTH INSURANCE | Source: Ambulatory Visit | Attending: Radiation Oncology | Admitting: Radiation Oncology

## 2014-12-13 ENCOUNTER — Encounter: Payer: Self-pay | Admitting: Radiation Oncology

## 2014-12-13 VITALS — BP 144/88 | HR 72 | Temp 95.6°F | Resp 22

## 2014-12-13 DIAGNOSIS — C50911 Malignant neoplasm of unspecified site of right female breast: Secondary | ICD-10-CM

## 2014-12-13 NOTE — Progress Notes (Signed)
Radiation Oncology Follow up Note  Name: Teresa Holland   Date:   12/13/2014 MRN:  [id number] DOB: 26-Oct-1950    This 64 y.o. female presents to the clinic today for follow-up for breast cancer.  REFERRING PROVIDER: Rusty Aus, MD  HPI: Patient is a 64 year old female originally consult with back in July when she underwent a wide local excision for a 9 mm ER/PR positive HER-2/neu negative invasive mammary carcinoma. Patient is extremely morbid obesity has difficulty lying on her back.. I initially recommended MammoSite therapy although the patient could not be put to sleep by general anesthesia so MammoSite balloon catheter was never placed. She has been started on letrozole by medical oncology is tolerating that well. She's missed many appointments in our department is seen today for again consideration of whole breast radiation.  COMPLICATIONS OF TREATMENT: none  FOLLOW UP COMPLIANCE: misses appointments   PHYSICAL EXAM:  BP 144/88 mmHg  Pulse 72  Temp(Src) 95.6 F (35.3 C)  Resp 22  Wt  Well-developed morbidly obese female in NAD. Well-developed well-nourished patient in NAD. HEENT reveals PERLA, EOMI, discs not visualized.  Oral cavity is clear. No oral mucosal lesions are identified. Neck is clear without evidence of cervical or supraclavicular adenopathy. Lungs are clear to A&P. Cardiac examination is essentially unremarkable with regular rate and rhythm without murmur rub or thrill. Abdomen is benign with no organomegaly or masses noted. Motor sensory and DTR levels are equal and symmetric in the upper and lower extremities. Cranial nerves II through XII are grossly intact. Proprioception is intact. No peripheral adenopathy or edema is identified. No motor or sensory levels are noted. Crude visual fields are within normal range.  RADIOLOGY RESULTS: No films reviewed  PLAN: At this time based on the extremely small size of her breast cancer, clear margins, her morbid obesity  and inability to lay flat on our exam table, ER/PR positive status of the tumor I believe patient would benefit from letrozole therapy alone with close observation with serial mammograms. I do not think the patient can tolerate 6 weeks of external beam radiation therapy based on the above-mentioned factors. I have discussed the case personally with medical oncology. We'll continue to closely observe the patient. I would be happy to reevaluate the patient any time should there be tumor recurrence.  I would like to take this opportunity for allowing me to participate in the care of your patient.Armstead Peaks., MD

## 2014-12-26 ENCOUNTER — Inpatient Hospital Stay: Payer: PRIVATE HEALTH INSURANCE | Attending: Oncology | Admitting: Oncology

## 2014-12-26 ENCOUNTER — Inpatient Hospital Stay: Payer: PRIVATE HEALTH INSURANCE

## 2014-12-26 VITALS — BP 177/85 | HR 73 | Temp 95.4°F | Wt 380.5 lb

## 2014-12-26 DIAGNOSIS — M549 Dorsalgia, unspecified: Secondary | ICD-10-CM | POA: Insufficient documentation

## 2014-12-26 DIAGNOSIS — E669 Obesity, unspecified: Secondary | ICD-10-CM | POA: Diagnosis not present

## 2014-12-26 DIAGNOSIS — K219 Gastro-esophageal reflux disease without esophagitis: Secondary | ICD-10-CM | POA: Insufficient documentation

## 2014-12-26 DIAGNOSIS — R0602 Shortness of breath: Secondary | ICD-10-CM | POA: Diagnosis not present

## 2014-12-26 DIAGNOSIS — R51 Headache: Secondary | ICD-10-CM | POA: Diagnosis not present

## 2014-12-26 DIAGNOSIS — C50511 Malignant neoplasm of lower-outer quadrant of right female breast: Secondary | ICD-10-CM | POA: Insufficient documentation

## 2014-12-26 DIAGNOSIS — Z79899 Other long term (current) drug therapy: Secondary | ICD-10-CM | POA: Diagnosis not present

## 2014-12-26 DIAGNOSIS — G4730 Sleep apnea, unspecified: Secondary | ICD-10-CM | POA: Diagnosis not present

## 2014-12-26 DIAGNOSIS — J45909 Unspecified asthma, uncomplicated: Secondary | ICD-10-CM | POA: Diagnosis not present

## 2014-12-26 DIAGNOSIS — I10 Essential (primary) hypertension: Secondary | ICD-10-CM | POA: Diagnosis not present

## 2014-12-26 DIAGNOSIS — L299 Pruritus, unspecified: Secondary | ICD-10-CM | POA: Insufficient documentation

## 2014-12-26 DIAGNOSIS — M109 Gout, unspecified: Secondary | ICD-10-CM

## 2014-12-26 DIAGNOSIS — E119 Type 2 diabetes mellitus without complications: Secondary | ICD-10-CM | POA: Diagnosis not present

## 2014-12-26 DIAGNOSIS — M129 Arthropathy, unspecified: Secondary | ICD-10-CM

## 2014-12-26 DIAGNOSIS — F419 Anxiety disorder, unspecified: Secondary | ICD-10-CM | POA: Diagnosis not present

## 2014-12-26 DIAGNOSIS — I509 Heart failure, unspecified: Secondary | ICD-10-CM | POA: Diagnosis not present

## 2014-12-26 DIAGNOSIS — Z79811 Long term (current) use of aromatase inhibitors: Secondary | ICD-10-CM | POA: Diagnosis not present

## 2014-12-26 DIAGNOSIS — C50911 Malignant neoplasm of unspecified site of right female breast: Secondary | ICD-10-CM

## 2014-12-26 DIAGNOSIS — Z87891 Personal history of nicotine dependence: Secondary | ICD-10-CM | POA: Insufficient documentation

## 2014-12-26 DIAGNOSIS — E039 Hypothyroidism, unspecified: Secondary | ICD-10-CM

## 2014-12-26 DIAGNOSIS — Z170 Estrogen receptor positive status [ER+]: Secondary | ICD-10-CM | POA: Insufficient documentation

## 2014-12-26 DIAGNOSIS — F329 Major depressive disorder, single episode, unspecified: Secondary | ICD-10-CM | POA: Insufficient documentation

## 2014-12-26 LAB — CBC WITH DIFFERENTIAL/PLATELET
BASOS ABS: 0 10*3/uL (ref 0–0.1)
BASOS PCT: 0 %
EOS ABS: 0.1 10*3/uL (ref 0–0.7)
EOS PCT: 2 %
HCT: 37.2 % (ref 35.0–47.0)
HEMOGLOBIN: 11.9 g/dL — AB (ref 12.0–16.0)
LYMPHS ABS: 1.9 10*3/uL (ref 1.0–3.6)
Lymphocytes Relative: 21 %
MCH: 25 pg — ABNORMAL LOW (ref 26.0–34.0)
MCHC: 31.9 g/dL — ABNORMAL LOW (ref 32.0–36.0)
MCV: 78.3 fL — ABNORMAL LOW (ref 80.0–100.0)
Monocytes Absolute: 0.5 10*3/uL (ref 0.2–0.9)
Monocytes Relative: 6 %
NEUTROS PCT: 71 %
Neutro Abs: 6.7 10*3/uL — ABNORMAL HIGH (ref 1.4–6.5)
PLATELETS: 236 10*3/uL (ref 150–440)
RBC: 4.75 MIL/uL (ref 3.80–5.20)
RDW: 15 % — ABNORMAL HIGH (ref 11.5–14.5)
WBC: 9.3 10*3/uL (ref 3.6–11.0)

## 2014-12-26 LAB — COMPREHENSIVE METABOLIC PANEL
ALBUMIN: 4.2 g/dL (ref 3.5–5.0)
ALK PHOS: 52 U/L (ref 38–126)
ALT: 24 U/L (ref 14–54)
ANION GAP: 9 (ref 5–15)
AST: 30 U/L (ref 15–41)
BUN: 22 mg/dL — AB (ref 6–20)
CO2: 29 mmol/L (ref 22–32)
Calcium: 8.9 mg/dL (ref 8.9–10.3)
Chloride: 97 mmol/L — ABNORMAL LOW (ref 101–111)
Creatinine, Ser: 1.21 mg/dL — ABNORMAL HIGH (ref 0.44–1.00)
GFR calc Af Amer: 54 mL/min — ABNORMAL LOW (ref >60)
GFR calc non Af Amer: 47 mL/min — ABNORMAL LOW (ref >60)
GLUCOSE: 217 mg/dL — AB (ref 65–99)
POTASSIUM: 4.1 mmol/L (ref 3.5–5.1)
SODIUM: 135 mmol/L (ref 135–145)
Total Bilirubin: 0.6 mg/dL (ref 0.3–1.2)
Total Protein: 7.7 g/dL (ref 6.5–8.1)

## 2014-12-26 MED ORDER — INFLUENZA VAC SPLIT QUAD 0.5 ML IM SUSY
0.5000 mL | PREFILLED_SYRINGE | Freq: Once | INTRAMUSCULAR | Status: AC
  Administered 2014-12-26: 0.5 mL via INTRAMUSCULAR

## 2014-12-26 MED ORDER — EXEMESTANE 25 MG PO TABS: 25.0000 mg | ORAL_TABLET | Freq: Every day | ORAL | Status: DC

## 2014-12-26 NOTE — Progress Notes (Signed)
Patient states since starting Letrazole she has been itching all over, havng hot flashes and leg cramps.  Patient further states her right breast feels hard around the nipple and the skin is dark.  Continues to have pain in that area as well.

## 2014-12-27 ENCOUNTER — Encounter: Payer: Self-pay | Admitting: Oncology

## 2014-12-27 NOTE — Progress Notes (Signed)
[place] @ [facility] [phone]  [phone]     Teresa Holland OB: [birth year]  MR#: [id number]  FHQ#:[id number]  Patient Care Team: Rusty Aus, MD as PCP - General (Internal Medicine) Leonie Green, MD as Referring Physician (Surgery) Noreene Filbert, MD as Referring Physician (Radiation Oncology)  CHIEF COMPLAINT:  Chief Complaint  Patient presents with  . Pruritis   Oncology History   1.  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  Morbid obesity 3.  Previous left breast biopsy in 1998 negative   4.  Patient did not have any further local therapy has considering patient's morbid conditions surgical service did not want any further Mammo print or further operative intervention.  Radiation oncologist did not feel like he can continue radiation therapy as patient claims that she cannot lie down flat. Started on letrozole from September of 2016   VISIT DIAGNOSIS:  Carcinoma of right breast   [name] came with stereotactic biopsy of the right upper outer quadrant of carcinoma of breast. Patient is now being evaluated prior to definitive surgical intervention INTERVAL HISTORY: Patient has lumpectomy but further local therapy either with partial breast radiation or external beam radiation cannot be done because of patient's inability to tolerate any further surgical intervention or lie down flat on the radiation table.  Patient has been evaluated by Dr. Rochel Brome as well as by radiation oncologist. Patient was then started on letrozole therapy to which patient complains of number of complaints including some bone pain generalized itching feeling weak and tired in intending not to continue letrozole therapy  REVIEW OF SYSTEMS:   GENERAL:  Feels good.  Active.  No  fevers,   Patient is  morbidly obese with weighing 380 pounds PERFORMANCE STATUS (ECOG):  1 HEENT:  No visual changes, runny nose, sore throat, mouth sores or tenderness. Lungs: No shortness of breath or cough.  No hemoptysis. Cardiac:  No chest pain, palpitations, orthopnea, or PND. GI:  No nausea, vomiting, diarrhea, constipation, melena or hematochezia. GU:  No urgency, frequency, dysuria, or hematuria. Musculoskeletal: Neck back pain with multiple back surgery  Extremities:  No pain or swelling. Skin:  No rashes or skin changes. Neuro:  No headache, numbness or weakness, balance or coordination issues. Endocrine:  No diabetes, thyroid issues, hot flashes or night sweats. Psych:  No mood changes, depression or anxiety. Pain:  No focal pain. Review of systems:  All other systems reviewed and found to be negative.  As per HPI. Otherwise, a complete review of systems is negatve.  PAST MEDICAL HISTORY: Past Medical History  Diagnosis Date  . Complication of anesthesia     heart issues 2004 during gb surgery had to be revived  . Hypertension   . Sleep apnea     could not use cpap uses 1.5 l [place] at night  . Asthma   . Hypothyroidism   . Diabetes mellitus without complication ([place])   . Depression   . Anxiety   . GERD (gastroesophageal reflux disease)   . Headache   . Arthritis   . Cancer (HCC)     breast  . Gout   . Pain     chronic back  . CHF (congestive heart failure) ([place])   . Obesity   . Back pain   . Shortness of breath dyspnea  PAST SURGICAL HISTORY: Past Surgical History  Procedure Laterality Date  . Breast biopsy Left 2004    negative  . Cholecystectomy    . Abdominal hysterectomy    . Oophorectomy    . Ankle fracture surgery    . Back surgery      lumbar fusion  . Partial mastectomy with needle localization Right 09/20/2014    Procedure: PARTIAL MASTECTOMY WITH NEEDLE LOCALIZATION;  Surgeon: Leonie Green, MD;  Location: [facility] ORS;  Service:  General;  Laterality: Right;  . Sentinel node biopsy Right 09/20/2014    Procedure: SENTINEL NODE BIOPSY;  Surgeon: Leonie Green, MD;  Location: [facility] ORS;  Service: General;  Laterality: Right;  . Abd pain      FAMILY HISTORY Family History  Problem Relation Age of Onset  . Breast cancer Paternal Aunt     40's       ADVANCED DIRECTIVES:  Patient does not have any living will or healthcare power of attorney.  Information was given .  Available resources had been discussed.  We will follow-up on subsequent appointments regarding this issue  HEALTH MAINTENANCE: Social History  Substance Use Topics  . Smoking status: Current Every Day Smoker    Types: E-cigarettes  . Smokeless tobacco: Never Used  . Alcohol Use: Yes      Allergies  Allergen Reactions  . Aspirin Nausea Only    Stomach burning  . Meloxicam Nausea Only  . Nsaids Nausea Only  . Furosemide Palpitations  . Latex Itching, Rash and Other (See Comments)    SNEEZING  . Tape Rash    Current Outpatient Prescriptions  Medication Sig Dispense Refill  . albuterol (VENTOLIN HFA) 108 (90 BASE) MCG/ACT inhaler Inhale 2 puffs into the lungs 4 (four) times daily.     . calcium carbonate (CALCIUM 600) 600 MG TABS tablet Take 1 tablet (600 mg total) by mouth 2 (two) times daily with a meal. 60 tablet 6  . Cholecalciferol (VITAMIN D3) 2000 UNITS capsule Take by mouth.    . diphenhydrAMINE (BENADRYL) 25 mg capsule Take 25 mg by mouth as needed.     . enalapril (VASOTEC) 20 MG tablet Take 20 mg by mouth every morning.     . furosemide (LASIX) 20 MG tablet Take 20 mg by mouth 2 (two) times daily.     Marland Kitchen gabapentin (NEURONTIN) 300 MG capsule Take 300 mg by mouth 3 (three) times daily.    Marland Kitchen glimepiride (AMARYL) 2 MG tablet Take 2 mg by mouth daily with breakfast.     . guaiFENesin (MUCINEX) 600 MG 12 hr tablet Take by mouth 2 (two) times daily as needed.    . hydrochlorothiazide (HYDRODIURIL) 25 MG tablet Take by mouth.      Marland Kitchen HYDROcodone-acetaminophen (NORCO/VICODIN) 5-325 MG per tablet Take 1-2 tablets by mouth every 6 (six) hours as needed.     Marland Kitchen levothyroxine (SYNTHROID, LEVOTHROID) 75 MCG tablet Take 75 mcg by mouth daily before breakfast.     . Linaclotide (LINZESS) 145 MCG CAPS capsule bid    . magnesium oxide (MAG-OX) 400 MG tablet TAKE ONE TABLET BY MOUTH TWICE DAILY    . metolazone (ZAROXOLYN) 2.5 MG tablet Take by mouth.    . metoprolol (LOPRESSOR) 50 MG tablet Take 50 mg by mouth 2 (two) times daily.     . pantoprazole (PROTONIX) 40 MG tablet Take 40 mg by mouth daily as needed.     Marland Kitchen Phenylephrine-Acetaminophen 5-325 MG TABS Take by mouth as  needed.    . polyethylene glycol (MIRALAX / GLYCOLAX) packet Take 17 g by mouth daily as needed.     . potassium chloride (K-DUR) 10 MEQ tablet Take 10 mEq by mouth 4 (four) times daily.     . Probiotic Product ([place]) CAPS Take by mouth.    . ranitidine (ZANTAC) 150 MG tablet Take by mouth.    . simethicone (MYLICON) 80 MG chewable tablet Chew by mouth.    . simvastatin (ZOCOR) 80 MG tablet Take 80 mg by mouth daily at 6 PM.     . torsemide (DEMADEX) 20 MG tablet Take by mouth.    Marland Kitchen exemestane (AROMASIN) 25 MG tablet Take 1 tablet (25 mg total) by mouth daily after breakfast. 30 tablet 6   No current facility-administered medications for this visit.    OBJECTIVE: PHYSICAL EXAM: GENERAL:  Well developed, well nourished, sitting comfortably in the exam room in no acute distress. MENTAL STATUS:  Alert and oriented to person, place and time. HEAD:  .  Normocephalic, atraumatic, face symmetric, no Cushingoid features. EYES: .  Pupils equal round and reactive to light and accomodation.  No conjunctivitis or scleral icterus.   RESPIRATORY:  Clear to auscultation without rales, wheezes or rhonchi. CARDIOVASCULAR:  Regular rate and rhythm without murmur, rub or gallop. BREAST:  Right breast without masses, biopsy site has in duration . skin  changes or nipple discharge.  Left breast without masses, skin changes or nipple discharge. ABDOMEN:  Soft, non-tender, with active bowel sounds, and no hepatosplenomegaly.  No masses. BACK:  No CVA tenderness.  No tenderness on percussion of the back or rib cage. SKIN:  No rashes, ulcers or lesions. EXTREMITIES: No edema, no skin discoloration or tenderness.  No palpable cords. LYMPH NODES: No palpable cervical, supraclavicular, axillary or inguinal adenopathy  NEUROLOGICAL: Unremarkable. PSYCH:  Appropriate.  Filed Vitals:   12/26/14 1456  BP: 177/85  Pulse: 73  Temp: 95.4 F (35.2 C)     Body mass index is 59.58 kg/(m^2).    ECOG FS:1 - Symptomatic but completely ambulatory  Pathology report has been reviewed.  All the x-rays were reviewed.:     STUDIES: No results found.  ASSESSMENT:  1.  Carcinoma of right breast status post stereotactic biopsy size of the tumor is 9 mm Estrogen and progesterone receptor positive HER-2/neu receptor negative further definitive surgery pending 2.  Morbid obesity with number of medical issues  PLAN:  Very difficult situation regarding local treatment.  Patient cannot be put under anesthesia for the mamosite  catheter placement. Patient may not able to lie down for long time for continuation of radiation therapy I had prolonged discussion with patient and family about further local treatment and anti-hormonal therapy will proceed with letrozole vitamin D calcium and risk of osteoporosis discussed.  Patient has number of complaints and she relates that to letrozole therapy.  I suggested that if she cannot tolerate letrozole then possibility of Aromasin should be considered.  But I again discussed importance of anti-hormonal therapy Total duration of visit was 54minutes.  50% or more time was spent in counseling patient and family regarding prognosis and options of treatment and available resources   Cancer of right breast   Staging form:  Breast, AJCC 7th Edition     Clinical: Stage IA (T1b, N0, cM0(i+)) - Signed by Forest Gleason, MD on 09/15/2014   Forest Gleason, MD   12/27/2014 9:32 AM

## 2015-01-10 DIAGNOSIS — I5032 Chronic diastolic (congestive) heart failure: Secondary | ICD-10-CM | POA: Insufficient documentation

## 2015-01-16 ENCOUNTER — Other Ambulatory Visit: Payer: PRIVATE HEALTH INSURANCE

## 2015-01-16 ENCOUNTER — Ambulatory Visit: Payer: PRIVATE HEALTH INSURANCE

## 2015-02-07 ENCOUNTER — Ambulatory Visit: Payer: PRIVATE HEALTH INSURANCE | Attending: Oncology

## 2015-02-07 ENCOUNTER — Other Ambulatory Visit: Payer: PRIVATE HEALTH INSURANCE

## 2015-02-12 ENCOUNTER — Ambulatory Visit: Payer: PRIVATE HEALTH INSURANCE | Attending: Specialist

## 2015-02-12 DIAGNOSIS — G4733 Obstructive sleep apnea (adult) (pediatric): Secondary | ICD-10-CM | POA: Insufficient documentation

## 2015-02-12 DIAGNOSIS — G4761 Periodic limb movement disorder: Secondary | ICD-10-CM | POA: Insufficient documentation

## 2015-02-27 ENCOUNTER — Ambulatory Visit
Admission: RE | Admit: 2015-02-27 | Discharge: 2015-02-27 | Disposition: A | Discharge status: Home / Self Care | Payer: PRIVATE HEALTH INSURANCE | Source: Ambulatory Visit | Attending: Oncology | Admitting: Oncology

## 2015-02-27 ENCOUNTER — Other Ambulatory Visit: Payer: Self-pay | Admitting: Oncology

## 2015-02-27 DIAGNOSIS — C50911 Malignant neoplasm of unspecified site of right female breast: Secondary | ICD-10-CM

## 2015-02-27 DIAGNOSIS — Z853 Personal history of malignant neoplasm of breast: Secondary | ICD-10-CM | POA: Insufficient documentation

## 2015-02-27 DIAGNOSIS — Z9889 Other specified postprocedural states: Secondary | ICD-10-CM | POA: Diagnosis not present

## 2015-02-27 HISTORY — DX: Malignant neoplasm of unspecified site of unspecified female breast: C50.919

## 2015-03-07 ENCOUNTER — Other Ambulatory Visit: Payer: Self-pay | Admitting: Nurse Practitioner

## 2015-03-07 DIAGNOSIS — K760 Fatty (change of) liver, not elsewhere classified: Secondary | ICD-10-CM

## 2015-03-14 ENCOUNTER — Ambulatory Visit: Payer: PRIVATE HEALTH INSURANCE

## 2015-03-21 ENCOUNTER — Ambulatory Visit
Admission: RE | Admit: 2015-03-21 | Discharge: 2015-03-21 | Disposition: A | Discharge status: Home / Self Care | Payer: PRIVATE HEALTH INSURANCE | Source: Ambulatory Visit | Attending: Nurse Practitioner | Admitting: Nurse Practitioner

## 2015-03-21 DIAGNOSIS — K760 Fatty (change of) liver, not elsewhere classified: Secondary | ICD-10-CM | POA: Diagnosis not present

## 2015-03-21 DIAGNOSIS — Z9049 Acquired absence of other specified parts of digestive tract: Secondary | ICD-10-CM | POA: Diagnosis not present

## 2015-04-04 ENCOUNTER — Ambulatory Visit: Admission: RE | Admit: 2015-04-04 | Payer: Medicare Other | Source: Ambulatory Visit | Admitting: Gastroenterology

## 2015-04-04 ENCOUNTER — Encounter: Admission: RE | Payer: Self-pay | Source: Ambulatory Visit

## 2015-04-04 SURGERY — EGD (ESOPHAGOGASTRODUODENOSCOPY)
Anesthesia: General

## 2015-05-22 ENCOUNTER — Encounter: Payer: Self-pay | Admitting: *Deleted

## 2015-05-23 ENCOUNTER — Encounter: Payer: Self-pay | Admitting: *Deleted

## 2015-05-23 ENCOUNTER — Encounter: Payer: Self-pay | Admitting: Registered Nurse

## 2015-05-23 ENCOUNTER — Ambulatory Visit
Admission: RE | Admit: 2015-05-23 | Discharge: 2015-05-23 | Disposition: A | Discharge status: Home / Self Care | Payer: PRIVATE HEALTH INSURANCE | Source: Ambulatory Visit | Attending: Gastroenterology | Admitting: Gastroenterology

## 2015-05-23 ENCOUNTER — Encounter
Admission: RE | Disposition: A | Discharge status: Home / Self Care | Payer: Self-pay | Source: Ambulatory Visit | Attending: Gastroenterology

## 2015-05-23 DIAGNOSIS — E119 Type 2 diabetes mellitus without complications: Secondary | ICD-10-CM | POA: Insufficient documentation

## 2015-05-23 DIAGNOSIS — G4730 Sleep apnea, unspecified: Secondary | ICD-10-CM | POA: Insufficient documentation

## 2015-05-23 DIAGNOSIS — E039 Hypothyroidism, unspecified: Secondary | ICD-10-CM | POA: Diagnosis not present

## 2015-05-23 DIAGNOSIS — Z7982 Long term (current) use of aspirin: Secondary | ICD-10-CM | POA: Diagnosis not present

## 2015-05-23 DIAGNOSIS — F419 Anxiety disorder, unspecified: Secondary | ICD-10-CM | POA: Insufficient documentation

## 2015-05-23 DIAGNOSIS — I10 Essential (primary) hypertension: Secondary | ICD-10-CM | POA: Diagnosis not present

## 2015-05-23 DIAGNOSIS — J449 Chronic obstructive pulmonary disease, unspecified: Secondary | ICD-10-CM | POA: Insufficient documentation

## 2015-05-23 DIAGNOSIS — Z853 Personal history of malignant neoplasm of breast: Secondary | ICD-10-CM | POA: Insufficient documentation

## 2015-05-23 DIAGNOSIS — Z79899 Other long term (current) drug therapy: Secondary | ICD-10-CM | POA: Insufficient documentation

## 2015-05-23 DIAGNOSIS — M1990 Unspecified osteoarthritis, unspecified site: Secondary | ICD-10-CM | POA: Insufficient documentation

## 2015-05-23 DIAGNOSIS — F329 Major depressive disorder, single episode, unspecified: Secondary | ICD-10-CM | POA: Diagnosis not present

## 2015-05-23 DIAGNOSIS — J45909 Unspecified asthma, uncomplicated: Secondary | ICD-10-CM | POA: Diagnosis not present

## 2015-05-23 DIAGNOSIS — Z539 Procedure and treatment not carried out, unspecified reason: Secondary | ICD-10-CM | POA: Diagnosis not present

## 2015-05-23 DIAGNOSIS — K219 Gastro-esophageal reflux disease without esophagitis: Secondary | ICD-10-CM | POA: Diagnosis present

## 2015-05-23 DIAGNOSIS — E669 Obesity, unspecified: Secondary | ICD-10-CM | POA: Insufficient documentation

## 2015-05-23 DIAGNOSIS — F17210 Nicotine dependence, cigarettes, uncomplicated: Secondary | ICD-10-CM | POA: Diagnosis not present

## 2015-05-23 DIAGNOSIS — M109 Gout, unspecified: Secondary | ICD-10-CM | POA: Insufficient documentation

## 2015-05-23 HISTORY — DX: Chronic obstructive pulmonary disease, unspecified: J44.9

## 2015-05-23 LAB — GLUCOSE, CAPILLARY: Glucose-Capillary: 309 mg/dL — ABNORMAL HIGH (ref 65–99)

## 2015-05-23 SURGERY — ESOPHAGOGASTRODUODENOSCOPY (EGD) WITH PROPOFOL
Anesthesia: General

## 2015-05-23 MED ORDER — SODIUM CHLORIDE 0.9 % IV SOLN: INTRAVENOUS | Status: DC

## 2015-05-23 NOTE — H&P (Signed)
Primary Care Physician:  Rusty Aus, MD Primary Gastroenterologist:  Dr. Candace Cruise  Pre-Procedure History & Physical: HPI:  Teresa Holland is a 65 y.o. female is here for an EGD.   Past Medical History  Diagnosis Date  . Complication of anesthesia     heart issues 2004 during gb surgery had to be revived  . Hypertension   . Sleep apnea     could not use cpap uses 1.5 l [place] at night  . Asthma   . Hypothyroidism   . Diabetes mellitus without complication ([place])   . Depression   . Anxiety   . GERD (gastroesophageal reflux disease)   . Headache   . Arthritis   . Cancer (HCC)     breast  . Gout   . Pain     chronic back  . CHF (congestive heart failure) ([place])   . Obesity   . Back pain   . Shortness of breath dyspnea   . Breast cancer ([place]) 2016    right breast  . COPD (chronic obstructive pulmonary disease) ([place])     Past Surgical History  Procedure Laterality Date  . Breast biopsy Left 2004    negative  . Cholecystectomy    . Abdominal hysterectomy    . Oophorectomy    . Ankle fracture surgery    . Back surgery      lumbar fusion  . Partial mastectomy with needle localization Right 09/20/2014    Procedure: PARTIAL MASTECTOMY WITH NEEDLE LOCALIZATION;  Surgeon: Leonie Green, MD;  Location: [facility] ORS;  Service: General;  Laterality: Right;  . Sentinel node biopsy Right 09/20/2014    Procedure: SENTINEL NODE BIOPSY;  Surgeon: Leonie Green, MD;  Location: [facility] ORS;  Service: General;  Laterality: Right;  . Abd pain      Prior to Admission medications   Medication Sig Start Date End Date Taking? Authorizing Provider  albuterol (VENTOLIN HFA) 108 (90 BASE) MCG/ACT inhaler Inhale 2 puffs into the lungs 4 (four) times daily.  02/20/14  Yes Historical Provider, MD  calcium carbonate (CALCIUM 600) 600 MG TABS tablet Take 1 tablet (600 mg total) by mouth 2 (two) times daily with a meal. 11/21/14  Yes Forest Gleason, MD  Cholecalciferol (VITAMIN D3) 2000 UNITS  capsule Take by mouth.   Yes Historical Provider, MD  enalapril (VASOTEC) 20 MG tablet Take 20 mg by mouth every morning.  02/20/14  Yes Historical Provider, MD  exemestane (AROMASIN) 25 MG tablet Take 1 tablet (25 mg total) by mouth daily after breakfast. 12/26/14  Yes Forest Gleason, MD  furosemide (LASIX) 20 MG tablet Take 20 mg by mouth 2 (two) times daily.  02/20/14  Yes Historical Provider, MD  gabapentin (NEURONTIN) 300 MG capsule Take 300 mg by mouth 3 (three) times daily.   Yes Historical Provider, MD  glimepiride (AMARYL) 2 MG tablet Take 2 mg by mouth daily with breakfast.  02/20/14  Yes Historical Provider, MD  hydrochlorothiazide (HYDRODIURIL) 25 MG tablet Take by mouth. 02/20/14  Yes Historical Provider, MD  levothyroxine (SYNTHROID, LEVOTHROID) 75 MCG tablet Take 75 mcg by mouth daily before breakfast.  02/20/14  Yes Historical Provider, MD  magnesium oxide (MAG-OX) 400 MG tablet TAKE ONE TABLET BY MOUTH TWICE DAILY 09/12/14  Yes Historical Provider, MD  metolazone (ZAROXOLYN) 2.5 MG tablet Take by mouth. 10/28/14 10/28/15 Yes Historical Provider, MD  metoprolol (LOPRESSOR) 50 MG tablet Take 50 mg by mouth 2 (two) times daily.  02/20/14  Yes Historical Provider, MD  potassium chloride (K-DUR) 10 MEQ tablet Take 10 mEq by mouth 4 (four) times daily.  02/20/14  Yes Historical Provider, MD  simvastatin (ZOCOR) 80 MG tablet Take 80 mg by mouth daily at 6 PM.  02/20/14  Yes Historical Provider, MD  torsemide (DEMADEX) 20 MG tablet Take by mouth. 10/28/14 10/28/15 Yes Historical Provider, MD  diphenhydrAMINE (BENADRYL) 25 mg capsule Take 25 mg by mouth as needed.     Historical Provider, MD  guaiFENesin (MUCINEX) 600 MG 12 hr tablet Take by mouth 2 (two) times daily as needed.    Historical Provider, MD  HYDROcodone-acetaminophen (NORCO/VICODIN) 5-325 MG per tablet Take 1-2 tablets by mouth every 6 (six) hours as needed.  09/16/14   Historical Provider, MD  Linaclotide Rolan Lipa) 145 MCG CAPS  capsule bid 08/01/14   Historical Provider, MD  pantoprazole (PROTONIX) 40 MG tablet Take 40 mg by mouth daily as needed.     Historical Provider, MD  Phenylephrine-Acetaminophen 5-325 MG TABS Take by mouth as needed.    Historical Provider, MD  polyethylene glycol (MIRALAX / GLYCOLAX) packet Take 17 g by mouth daily as needed.     Historical Provider, MD  Probiotic Product ([place]) CAPS Take by mouth. 11/22/14   Historical Provider, MD  ranitidine (ZANTAC) 150 MG tablet Take by mouth. 11/22/14 11/22/15  Historical Provider, MD  simethicone (MYLICON) 80 MG chewable tablet Chew by mouth.    Historical Provider, MD    Allergies as of 05/09/2015 - Review Complete 12/27/2014  Allergen Reaction Noted  . Aspirin Nausea Only 09/12/2014  . Meloxicam Nausea Only 09/12/2014  . Nsaids Nausea Only 09/12/2014  . Furosemide Palpitations 09/12/2014  . Latex Itching, Rash, and Other (See Comments) 10/09/2014  . Tape Rash 10/09/2014    Family History  Problem Relation Age of Onset  . Breast cancer Paternal Aunt     74's    Social History   Social History  . Marital Status: Married    Spouse Name: N/A  . Number of Children: N/A  . Years of Education: N/A   Occupational History  . Not on file.   Social History Main Topics  . Smoking status: Current Every Day Smoker    Types: E-cigarettes  . Smokeless tobacco: Never Used  . Alcohol Use: Yes  . Drug Use: No  . Sexual Activity: Not on file   Other Topics Concern  . Not on file   Social History Narrative    Review of Systems: See HPI, otherwise negative ROS  Physical Exam: BP 143/63 mmHg  Pulse 79  Temp(Src) 97.8 F (36.6 C) (Tympanic)  Resp 22  Ht 5\' 8"  (1.727 m)  Wt 167.831 kg (370 lb)  BMI 56.27 kg/m2  SpO2 98% General:   Alert,  pleasant and cooperative in NAD Head:  Normocephalic and atraumatic. Neck:  Supple; no masses or thyromegaly. Lungs:  Clear throughout to auscultation.    Heart:  Regular rate and  rhythm. Abdomen:  Soft, nontender and nondistended. Normal bowel sounds, without guarding, and without rebound.   Neurologic:  Alert and  oriented x4;  grossly normal neurologically.  Impression/Plan: Teresa Holland is here for an EGD to be performed for GERD.  Risks, benefits, limitations, and alternatives regarding  EGD have been reviewed with the patient.  Questions have been answered.  All parties agreeable.   Icela Glymph, Lupita Dawn, MD  05/23/2015, 8:31 AM

## 2015-05-29 DIAGNOSIS — E119 Type 2 diabetes mellitus without complications: Secondary | ICD-10-CM | POA: Insufficient documentation

## 2015-06-26 ENCOUNTER — Inpatient Hospital Stay (HOSPITAL_BASED_OUTPATIENT_CLINIC_OR_DEPARTMENT_OTHER): Payer: PRIVATE HEALTH INSURANCE | Admitting: Oncology

## 2015-06-26 ENCOUNTER — Encounter: Payer: Self-pay | Admitting: Oncology

## 2015-06-26 ENCOUNTER — Inpatient Hospital Stay: Payer: PRIVATE HEALTH INSURANCE | Attending: Oncology

## 2015-06-26 VITALS — BP 145/80 | HR 87 | Temp 96.9°F | Resp 18 | Wt 372.4 lb

## 2015-06-26 DIAGNOSIS — Z79899 Other long term (current) drug therapy: Secondary | ICD-10-CM

## 2015-06-26 DIAGNOSIS — M549 Dorsalgia, unspecified: Secondary | ICD-10-CM | POA: Insufficient documentation

## 2015-06-26 DIAGNOSIS — J45909 Unspecified asthma, uncomplicated: Secondary | ICD-10-CM | POA: Diagnosis not present

## 2015-06-26 DIAGNOSIS — E669 Obesity, unspecified: Secondary | ICD-10-CM | POA: Diagnosis not present

## 2015-06-26 DIAGNOSIS — Z923 Personal history of irradiation: Secondary | ICD-10-CM

## 2015-06-26 DIAGNOSIS — Z170 Estrogen receptor positive status [ER+]: Secondary | ICD-10-CM | POA: Diagnosis not present

## 2015-06-26 DIAGNOSIS — E039 Hypothyroidism, unspecified: Secondary | ICD-10-CM | POA: Insufficient documentation

## 2015-06-26 DIAGNOSIS — Z9012 Acquired absence of left breast and nipple: Secondary | ICD-10-CM

## 2015-06-26 DIAGNOSIS — Z803 Family history of malignant neoplasm of breast: Secondary | ICD-10-CM | POA: Diagnosis not present

## 2015-06-26 DIAGNOSIS — C50511 Malignant neoplasm of lower-outer quadrant of right female breast: Secondary | ICD-10-CM

## 2015-06-26 DIAGNOSIS — I509 Heart failure, unspecified: Secondary | ICD-10-CM | POA: Diagnosis not present

## 2015-06-26 DIAGNOSIS — G8929 Other chronic pain: Secondary | ICD-10-CM

## 2015-06-26 DIAGNOSIS — E119 Type 2 diabetes mellitus without complications: Secondary | ICD-10-CM | POA: Diagnosis not present

## 2015-06-26 DIAGNOSIS — M129 Arthropathy, unspecified: Secondary | ICD-10-CM

## 2015-06-26 DIAGNOSIS — K219 Gastro-esophageal reflux disease without esophagitis: Secondary | ICD-10-CM

## 2015-06-26 DIAGNOSIS — F418 Other specified anxiety disorders: Secondary | ICD-10-CM | POA: Diagnosis not present

## 2015-06-26 DIAGNOSIS — G4730 Sleep apnea, unspecified: Secondary | ICD-10-CM | POA: Insufficient documentation

## 2015-06-26 DIAGNOSIS — Z79811 Long term (current) use of aromatase inhibitors: Secondary | ICD-10-CM | POA: Insufficient documentation

## 2015-06-26 DIAGNOSIS — Z7984 Long term (current) use of oral hypoglycemic drugs: Secondary | ICD-10-CM | POA: Insufficient documentation

## 2015-06-26 DIAGNOSIS — I10 Essential (primary) hypertension: Secondary | ICD-10-CM | POA: Insufficient documentation

## 2015-06-26 DIAGNOSIS — M109 Gout, unspecified: Secondary | ICD-10-CM | POA: Diagnosis not present

## 2015-06-26 DIAGNOSIS — J449 Chronic obstructive pulmonary disease, unspecified: Secondary | ICD-10-CM

## 2015-06-26 DIAGNOSIS — F17210 Nicotine dependence, cigarettes, uncomplicated: Secondary | ICD-10-CM | POA: Insufficient documentation

## 2015-06-26 DIAGNOSIS — C50911 Malignant neoplasm of unspecified site of right female breast: Secondary | ICD-10-CM

## 2015-06-26 LAB — COMPREHENSIVE METABOLIC PANEL
ALBUMIN: 4.4 g/dL (ref 3.5–5.0)
ALK PHOS: 63 U/L (ref 38–126)
ALT: 24 U/L (ref 14–54)
AST: 28 U/L (ref 15–41)
Anion gap: 15 (ref 5–15)
BUN: 30 mg/dL — AB (ref 6–20)
CALCIUM: 10.6 mg/dL — AB (ref 8.9–10.3)
CHLORIDE: 94 mmol/L — AB (ref 101–111)
CO2: 29 mmol/L (ref 22–32)
CREATININE: 1.63 mg/dL — AB (ref 0.44–1.00)
GFR, EST AFRICAN AMERICAN: 38 mL/min — AB (ref >60)
GFR, EST NON AFRICAN AMERICAN: 33 mL/min — AB (ref >60)
Glucose, Bld: 260 mg/dL — ABNORMAL HIGH (ref 65–99)
Potassium: 4.4 mmol/L (ref 3.5–5.1)
SODIUM: 138 mmol/L (ref 135–145)
Total Bilirubin: 0.9 mg/dL (ref 0.3–1.2)
Total Protein: 8.6 g/dL — ABNORMAL HIGH (ref 6.5–8.1)

## 2015-06-26 LAB — CBC WITH DIFFERENTIAL/PLATELET
Basophils Absolute: 0.1 10*3/uL (ref 0–0.1)
Basophils Relative: 1 %
Eosinophils Absolute: 0.2 10*3/uL (ref 0–0.7)
Eosinophils Relative: 1 %
HCT: 39.7 % (ref 35.0–47.0)
Hemoglobin: 12.8 g/dL (ref 12.0–16.0)
LYMPHS ABS: 2.2 10*3/uL (ref 1.0–3.6)
LYMPHS PCT: 19 %
MCH: 25.5 pg — ABNORMAL LOW (ref 26.0–34.0)
MCHC: 32.3 g/dL (ref 32.0–36.0)
MCV: 78.9 fL — ABNORMAL LOW (ref 80.0–100.0)
MONO ABS: 0.7 10*3/uL (ref 0.2–0.9)
MONOS PCT: 6 %
NEUTROS ABS: 8.6 10*3/uL — AB (ref 1.4–6.5)
Neutrophils Relative %: 73 %
PLATELETS: 270 10*3/uL (ref 150–440)
RBC: 5.03 MIL/uL (ref 3.80–5.20)
RDW: 14.4 % (ref 11.5–14.5)
WBC: 11.8 10*3/uL — ABNORMAL HIGH (ref 3.6–11.0)

## 2015-06-26 NOTE — Progress Notes (Signed)
Patient wants to know how long she will be coming off Exemestane?

## 2015-06-26 NOTE — Progress Notes (Signed)
[place] @ [facility], [place] Telephone:(336) [phone]  [phone]     JANANI CHAMBER OB: [birth year]  MR#: [id number]  PFX#:[id number]  Patient Care Team: Rusty Aus, MD as PCP - General (Internal Medicine) Leonie Green, MD as Referring Physician (Surgery) Noreene Filbert, MD as Referring Physician (Radiation Oncology)  CHIEF COMPLAINT:  Chief Complaint  Patient presents with  . Breast Cancer   Oncology History   1.  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  Morbid obesity 3.  Previous left breast biopsy in 1998 negative   4.  Patient did not have any further local therapy has considering patient's morbid conditions surgical service did not want any further Mammo print or further operative intervention.  Radiation oncologist did not feel like she can continue radiation therapy as patient claims that she cannot lie down flat.. Patient did not want to get mastectomy done Started on letrozole from September of 2016 (patient could not tolerate letrozole with #048 complaint) Switched to aromosin (patient desires to come off Aromasin)  VISIT DIAGNOSIS:  Carcinoma of right breast Patient has lumpectomy but further local therapy either with partial breast radiation or external beam radiation cannot be done because of patient's inability to tolerate any further surgical intervention or lie down flat on the radiation table.  Patient has been evaluated by Dr. Rochel Brome as well as by radiation oncologist. Patient was then started on letrozole therapy to which patient complains of number of complaints including some bone pain generalized itching feeling weak and tired in intending not to continue letrozole therapy  65 year old African-American lady came with stereotactic biopsy of the right upper outer quadrant of carcinoma  of breast. Patient is now being evaluated prior to definitive surgical intervention. Patient Field likes Aromasin is making her sick . was to come off Aromasin.. Complains of pressure-like symptoms in the epigastric area had an ultrasound done of abdomen which was reported to be negative however was not complete picture.  She was advised to get evaluation the upper endoscopy however her blood sugar was elevated.  Total upper endoscopy was postponed.    Shirlean Kelly, RN Registered Nurse Sign at close encounter  Service date: 06/26/2015 3:41 PM    Expand All Collapse All    Patient wants to know how long she will be coming off Exemestane?     REVIEW OF SYSTEMS:   GENERAL:  Feels good.  Active.  No fevers,   Patient is  morbidly obese with weighing 380 pounds. Patients consider Aromasin is causing number of issues with her and desires to come off PERFORMANCE STATUS (ECOG):  1 HEENT:  No visual changes, runny nose, sore throat, mouth sores or tenderness. Lungs: No shortness of breath or cough.  No hemoptysis. Cardiac:  No chest pain, palpitations, orthopnea, or PND. GI:  No nausea, vomiting, diarrhea, constipation, melena or hematochezia. GU:  No urgency, frequency, dysuria, or hematuria. Musculoskeletal: Neck back pain with multiple back surgery  Extremities:  No pain or swelling. Skin:  No rashes or skin changes. Neuro:  No headache, numbness or weakness, balance or coordination issues. Endocrine:  No diabetes, thyroid issues, hot flashes or night sweats. Psych:  No mood changes, depression or anxiety. Pain:  No focal pain. Review of systems:  All other systems reviewed and found to be negative.  As per HPI. Otherwise, a complete review  of systems is negatve.  PAST MEDICAL HISTORY: Past Medical History  Diagnosis Date  . Complication of anesthesia     heart issues 2004 during gb surgery had to be revived  . Hypertension   . Sleep apnea     could not use cpap uses 1.5 l [place] at  night  . Asthma   . Hypothyroidism   . Diabetes mellitus without complication ([place])   . Depression   . Anxiety   . GERD (gastroesophageal reflux disease)   . Headache   . Arthritis   . Cancer (HCC)     breast  . Gout   . Pain     chronic back  . CHF (congestive heart failure) ([place])   . Obesity   . Back pain   . Shortness of breath dyspnea   . Breast cancer ([place]) 2016    right breast  . COPD (chronic obstructive pulmonary disease) ([place])     PAST SURGICAL HISTORY: Past Surgical History  Procedure Laterality Date  . Breast biopsy Left 2004    negative  . Cholecystectomy    . Abdominal hysterectomy    . Oophorectomy    . Ankle fracture surgery    . Back surgery      lumbar fusion  . Partial mastectomy with needle localization Right 09/20/2014    Procedure: PARTIAL MASTECTOMY WITH NEEDLE LOCALIZATION;  Surgeon: Leonie Green, MD;  Location: [facility] ORS;  Service: General;  Laterality: Right;  . Sentinel node biopsy Right 09/20/2014    Procedure: SENTINEL NODE BIOPSY;  Surgeon: Leonie Green, MD;  Location: [facility] ORS;  Service: General;  Laterality: Right;  . Abd pain      FAMILY HISTORY Family History  Problem Relation Age of Onset  . Breast cancer Paternal Aunt     25's       ADVANCED DIRECTIVES:  Patient does not have any living will or healthcare power of attorney.  Information was given .  Available resources had been discussed.  We will follow-up on subsequent appointments regarding this issue  HEALTH MAINTENANCE: Social History  Substance Use Topics  . Smoking status: Current Every Day Smoker    Types: E-cigarettes  . Smokeless tobacco: Never Used  . Alcohol Use: Yes      Allergies  Allergen Reactions  . Aspirin Nausea Only    Stomach burning  . Meloxicam Nausea Only  . Nsaids Nausea Only  . Furosemide Palpitations  . Latex Itching, Rash and Other (See Comments)    SNEEZING  . Tape Rash    Current Outpatient Prescriptions  Medication  Sig Dispense Refill  . albuterol (VENTOLIN HFA) 108 (90 BASE) MCG/ACT inhaler Inhale 2 puffs into the lungs 4 (four) times daily.     . calcium carbonate (CALCIUM 600) 600 MG TABS tablet Take 1 tablet (600 mg total) by mouth 2 (two) times daily with a meal. 60 tablet 6  . Cholecalciferol (VITAMIN D3) 2000 UNITS capsule Take by mouth.    . diphenhydrAMINE (BENADRYL) 25 mg capsule Take 25 mg by mouth as needed.     . enalapril (VASOTEC) 20 MG tablet Take 20 mg by mouth every morning.     Marland Kitchen exemestane (AROMASIN) 25 MG tablet Take 1 tablet (25 mg total) by mouth daily after breakfast. 30 tablet 6  . furosemide (LASIX) 20 MG tablet Take 20 mg by mouth 2 (two) times daily.     Marland Kitchen gabapentin (NEURONTIN) 300 MG capsule Take 300 mg by mouth 3 (  three) times daily.    Marland Kitchen glimepiride (AMARYL) 2 MG tablet Take 2 mg by mouth daily with breakfast.     . guaiFENesin (MUCINEX) 600 MG 12 hr tablet Take by mouth 2 (two) times daily as needed.    . hydrochlorothiazide (HYDRODIURIL) 25 MG tablet Take by mouth.    Marland Kitchen HYDROcodone-acetaminophen (NORCO/VICODIN) 5-325 MG per tablet Take 1-2 tablets by mouth every 6 (six) hours as needed.     Marland Kitchen levothyroxine (SYNTHROID, LEVOTHROID) 75 MCG tablet Take 75 mcg by mouth daily before breakfast.     . Linaclotide (LINZESS) 145 MCG CAPS capsule bid    . magnesium oxide (MAG-OX) 400 MG tablet TAKE ONE TABLET BY MOUTH TWICE DAILY    . metolazone (ZAROXOLYN) 2.5 MG tablet Take by mouth.    . metoprolol (LOPRESSOR) 50 MG tablet Take 50 mg by mouth 2 (two) times daily.     . pantoprazole (PROTONIX) 40 MG tablet Take 40 mg by mouth daily as needed.     Marland Kitchen Phenylephrine-Acetaminophen 5-325 MG TABS Take by mouth as needed.    . polyethylene glycol (MIRALAX / GLYCOLAX) packet Take 17 g by mouth daily as needed.     . potassium chloride (K-DUR) 10 MEQ tablet Take 10 mEq by mouth 4 (four) times daily.     . Probiotic Product ([place]) CAPS Take by mouth.    . ranitidine  (ZANTAC) 150 MG tablet Take by mouth.    . simethicone (MYLICON) 80 MG chewable tablet Chew by mouth.    . simvastatin (ZOCOR) 80 MG tablet Take 80 mg by mouth daily at 6 PM.     . torsemide (DEMADEX) 20 MG tablet Take by mouth.     No current facility-administered medications for this visit.    OBJECTIVE: PHYSICAL EXAM: GENERAL:  Well developed, well nourished, sitting comfortably in the exam room in no acute distress. MENTAL STATUS:  Alert and oriented to person, place and time. HEAD:  .  Normocephalic, atraumatic, face symmetric, no Cushingoid features. EYES: .  Pupils equal round and reactive to light and accomodation.  No conjunctivitis or scleral icterus.   RESPIRATORY:  Clear to auscultation without rales, wheezes or rhonchi. CARDIOVASCULAR:  Regular rate and rhythm without murmur, rub or gallop. BREAST:  Right breast without masses, biopsy site has in duration . skin changes or nipple discharge.  Left breast without masses, skin changes or nipple discharge. ABDOMEN:  Soft, non-tender, with active bowel sounds, and no hepatosplenomegaly.  No masses. BACK:  No CVA tenderness.  No tenderness on percussion of the back or rib cage. SKIN:  No rashes, ulcers or lesions. EXTREMITIES: No edema, no skin discoloration or tenderness.  No palpable cords. LYMPH NODES: No palpable cervical, supraclavicular, axillary or inguinal adenopathy  NEUROLOGICAL: Unremarkable. PSYCH:  Appropriate.  Filed Vitals:   06/26/15 1540  BP: 145/80  Pulse: 87  Temp: 96.9 F (36.1 C)  Resp: 18     Body mass index is 56.63 kg/(m^2).    ECOG FS:1 - Symptomatic but completely ambulatory  Pathology report has been reviewed.  All the x-rays were reviewed.:     STUDIES: No results found.  ASSESSMENT:  1.  Carcinoma of right breast status post stereotactic biopsy size of the tumor is 9 mm Estrogen and progesterone receptor positive HER-2/neu receptor negative further definitive surgery pending 2.   Morbid obesity with number of medical issues  PLAN:   Mammogram was done in December which has been reported to be  negative.  Ultrasound of abdomen done recently for epigastric discomfort has been reviewed. Ultrasound was not completed because of body habitus I had prolonged discussion with patient IE discussed possibility of continuing Aromasin as patient never had any local therapy. Patient could not tolerate radiation therapy and when surgery was offered in form of mastectomy patient did not want to get mastectomy.  Patient was fully aware of increased chance of local recurrence as well as systemic recurrence hopefully patient will continue Aromasin.  All lab data has been reviewed. Patient will be seen in 6 month by my associate at the same time a bone density study can be done in December of 2017  Cancer of right breast   Staging form: Breast, AJCC 7th Edition     Clinical: Stage IA (T1b, N0, cM0(i+)) - Signed by Forest Gleason, MD on 09/15/2014   Forest Gleason, MD   06/26/2015 4:17 PM

## 2015-07-19 ENCOUNTER — Other Ambulatory Visit: Payer: Self-pay | Admitting: Oncology

## 2015-08-15 ENCOUNTER — Other Ambulatory Visit: Payer: Self-pay | Admitting: Oncology

## 2015-09-10 ENCOUNTER — Other Ambulatory Visit: Payer: Self-pay | Admitting: Hematology and Oncology

## 2015-09-30 ENCOUNTER — Telehealth: Payer: Self-pay

## 2015-09-30 ENCOUNTER — Other Ambulatory Visit: Payer: Self-pay

## 2015-09-30 DIAGNOSIS — C50911 Malignant neoplasm of unspecified site of right female breast: Secondary | ICD-10-CM

## 2015-09-30 NOTE — Telephone Encounter (Signed)
Pharmacy contacted refill request to be faxed over, will have is signed and will fax back to pharmacy

## 2015-10-02 ENCOUNTER — Telehealth: Payer: Self-pay | Admitting: *Deleted

## 2015-10-02 MED ORDER — CALCIUM CARBONATE 600 MG PO TABS: 600.0000 mg | ORAL_TABLET | Freq: Two times a day (BID) | ORAL | 6 refills | Status: AC

## 2015-10-02 NOTE — Telephone Encounter (Signed)
escribed

## 2015-10-09 ENCOUNTER — Other Ambulatory Visit: Payer: Self-pay | Admitting: Hematology and Oncology

## 2015-11-10 ENCOUNTER — Other Ambulatory Visit: Payer: Self-pay | Admitting: Hematology and Oncology

## 2015-11-14 ENCOUNTER — Other Ambulatory Visit: Payer: Self-pay | Admitting: *Deleted

## 2015-11-14 DIAGNOSIS — C50511 Malignant neoplasm of lower-outer quadrant of right female breast: Secondary | ICD-10-CM

## 2015-12-17 ENCOUNTER — Inpatient Hospital Stay: Payer: PRIVATE HEALTH INSURANCE

## 2015-12-17 ENCOUNTER — Inpatient Hospital Stay: Payer: PRIVATE HEALTH INSURANCE | Admitting: Hematology and Oncology

## 2015-12-18 ENCOUNTER — Ambulatory Visit: Payer: PRIVATE HEALTH INSURANCE | Admitting: Hematology and Oncology

## 2015-12-18 ENCOUNTER — Other Ambulatory Visit: Payer: PRIVATE HEALTH INSURANCE

## 2015-12-19 ENCOUNTER — Inpatient Hospital Stay: Payer: PRIVATE HEALTH INSURANCE | Attending: Hematology and Oncology

## 2015-12-19 ENCOUNTER — Inpatient Hospital Stay (HOSPITAL_BASED_OUTPATIENT_CLINIC_OR_DEPARTMENT_OTHER): Payer: PRIVATE HEALTH INSURANCE | Admitting: Internal Medicine

## 2015-12-19 VITALS — BP 136/76 | HR 81 | Temp 97.4°F | Resp 18

## 2015-12-19 DIAGNOSIS — K219 Gastro-esophageal reflux disease without esophagitis: Secondary | ICD-10-CM | POA: Diagnosis not present

## 2015-12-19 DIAGNOSIS — F329 Major depressive disorder, single episode, unspecified: Secondary | ICD-10-CM

## 2015-12-19 DIAGNOSIS — F17210 Nicotine dependence, cigarettes, uncomplicated: Secondary | ICD-10-CM

## 2015-12-19 DIAGNOSIS — I509 Heart failure, unspecified: Secondary | ICD-10-CM | POA: Insufficient documentation

## 2015-12-19 DIAGNOSIS — E119 Type 2 diabetes mellitus without complications: Secondary | ICD-10-CM

## 2015-12-19 DIAGNOSIS — J449 Chronic obstructive pulmonary disease, unspecified: Secondary | ICD-10-CM | POA: Diagnosis not present

## 2015-12-19 DIAGNOSIS — M549 Dorsalgia, unspecified: Secondary | ICD-10-CM | POA: Diagnosis not present

## 2015-12-19 DIAGNOSIS — M109 Gout, unspecified: Secondary | ICD-10-CM | POA: Insufficient documentation

## 2015-12-19 DIAGNOSIS — N644 Mastodynia: Secondary | ICD-10-CM | POA: Diagnosis not present

## 2015-12-19 DIAGNOSIS — Z170 Estrogen receptor positive status [ER+]: Secondary | ICD-10-CM

## 2015-12-19 DIAGNOSIS — Z79899 Other long term (current) drug therapy: Secondary | ICD-10-CM | POA: Diagnosis not present

## 2015-12-19 DIAGNOSIS — E669 Obesity, unspecified: Secondary | ICD-10-CM | POA: Insufficient documentation

## 2015-12-19 DIAGNOSIS — G8929 Other chronic pain: Secondary | ICD-10-CM

## 2015-12-19 DIAGNOSIS — Z803 Family history of malignant neoplasm of breast: Secondary | ICD-10-CM | POA: Insufficient documentation

## 2015-12-19 DIAGNOSIS — G4730 Sleep apnea, unspecified: Secondary | ICD-10-CM | POA: Diagnosis not present

## 2015-12-19 DIAGNOSIS — E039 Hypothyroidism, unspecified: Secondary | ICD-10-CM | POA: Diagnosis not present

## 2015-12-19 DIAGNOSIS — F419 Anxiety disorder, unspecified: Secondary | ICD-10-CM

## 2015-12-19 DIAGNOSIS — Z79811 Long term (current) use of aromatase inhibitors: Secondary | ICD-10-CM | POA: Diagnosis not present

## 2015-12-19 DIAGNOSIS — M129 Arthropathy, unspecified: Secondary | ICD-10-CM

## 2015-12-19 DIAGNOSIS — I10 Essential (primary) hypertension: Secondary | ICD-10-CM

## 2015-12-19 DIAGNOSIS — I809 Phlebitis and thrombophlebitis of unspecified site: Secondary | ICD-10-CM

## 2015-12-19 DIAGNOSIS — C50511 Malignant neoplasm of lower-outer quadrant of right female breast: Secondary | ICD-10-CM

## 2015-12-19 DIAGNOSIS — J45909 Unspecified asthma, uncomplicated: Secondary | ICD-10-CM

## 2015-12-19 LAB — CBC WITH DIFFERENTIAL/PLATELET
Basophils Absolute: 0 10*3/uL (ref 0–0.1)
Basophils Relative: 0 %
Eosinophils Absolute: 0.1 10*3/uL (ref 0–0.7)
Eosinophils Relative: 1 %
HCT: 39.3 % (ref 35.0–47.0)
Hemoglobin: 12.9 g/dL (ref 12.0–16.0)
Lymphocytes Relative: 20 %
Lymphs Abs: 1.9 10*3/uL (ref 1.0–3.6)
MCH: 25.6 pg — ABNORMAL LOW (ref 26.0–34.0)
MCHC: 32.8 g/dL (ref 32.0–36.0)
MCV: 78.1 fL — ABNORMAL LOW (ref 80.0–100.0)
Monocytes Absolute: 0.6 10*3/uL (ref 0.2–0.9)
Monocytes Relative: 6 %
Neutro Abs: 6.9 10*3/uL — ABNORMAL HIGH (ref 1.4–6.5)
Neutrophils Relative %: 73 %
Platelets: 259 10*3/uL (ref 150–440)
RBC: 5.02 MIL/uL (ref 3.80–5.20)
RDW: 14.8 % — ABNORMAL HIGH (ref 11.5–14.5)
WBC: 9.5 10*3/uL (ref 3.6–11.0)

## 2015-12-19 NOTE — Assessment & Plan Note (Addendum)
Right breast ER positive cancer - lumpectomy 07/2014, Sentinal lymph node biopsy biopsy. No  radiation was given  Tried letrozole for a brief period, intolerant - switched to Aromasin since 10/2014 A B/L/ bilateral mammogram  From 01/2015 was benign. An ultrasound also done on the right at that time. She developed a post op seroma in the right breast which on her last [place] from dec 2016 showed decline in size  She complains of bilateral breast pains, left more than right, the pain has been present for a year, but the left breast pain has become severe and very tender over the past few months. She also feels alarmed by her right breast ebign larger than the left On exam- the left breast shows no signs of skin redness or edema, there is tenederss on deep palapation in the infraclavicular and left axillary area, suggesting a musculo skeletal origin  Right breast is non tender, no palpablle lumps No nipple irregularites were noted No arm edema was noted   No axiallry adenopathy was noted  I will request a rib x-ray on the left , bialetral breast ultraosund and a boen scan, return in 2 weeks to review Advised her to not lay on the left side. Continue Exemestane

## 2015-12-19 NOTE — Progress Notes (Signed)
[name]  Telephone:(336) [phone] [phone]  ID: [name] OB: [birth year]  MR#: [id number]  XFG#:[id number]  Patient Care Team: Rusty Aus, MD as PCP - General (Internal Medicine) Leonie Green, MD as Referring Physician (Surgery) Noreene Filbert, MD as Referring Physician (Radiation Oncology)  CHIEF COMPLAINT: Malignant neoplasm of right female breast, unspecified site     INTERVAL HISTORY: She was diagnosed with right breast cancer in June 2016 T1 N0 M0 ER positive PR positive and HER-2/neu negative. She had a lumpectomy but no further local regional therapy was given she could not lay flat due to morbid obesity for radiation she declined to have a mastectomy She began letrozole in September 2016 subsequently switched to Aromasin due to poor tolerance to Letrozole.  She complains of bilateral breast pains, left more than right, the pain has been present for a year, but the left breast pain has become severe and very tender over the past few months. She also feels alarmed by her right breast ebign larger than the left      REVIEW OF SYSTEMS:   ROS  As per HPI. Otherwise, a complete review of systems is negative.  PAST MEDICAL HISTORY: Past Medical History:  Diagnosis Date  . Anxiety   . Arthritis   . Asthma   . Back pain   . Breast cancer ([place]) 2016   right breast  . Cancer ([place])    breast  . CHF (congestive heart failure) ([place])   . Complication of anesthesia    heart issues 2004 during gb surgery had to be revived  . COPD (chronic obstructive pulmonary disease) ([place])   . Depression   . Diabetes mellitus without complication ([place])   . GERD (gastroesophageal reflux disease)   . Gout   . Headache   . Hypertension   . Hypothyroidism   . Obesity   . Pain    chronic back  . Shortness of breath dyspnea   . Sleep apnea    could not use cpap uses 1.5 l [place] at night    PAST SURGICAL HISTORY: Past Surgical History:    Procedure Laterality Date  . abd pain    . ABDOMINAL HYSTERECTOMY    . ANKLE FRACTURE SURGERY    . BACK SURGERY     lumbar fusion  . BREAST BIOPSY Left 2004   negative  . CHOLECYSTECTOMY    . OOPHORECTOMY    . PARTIAL MASTECTOMY WITH NEEDLE LOCALIZATION Right 09/20/2014   Procedure: PARTIAL MASTECTOMY WITH NEEDLE LOCALIZATION;  Surgeon: Leonie Green, MD;  Location: [facility] ORS;  Service: General;  Laterality: Right;  . SENTINEL NODE BIOPSY Right 09/20/2014   Procedure: SENTINEL NODE BIOPSY;  Surgeon: Leonie Green, MD;  Location: [facility] ORS;  Service: General;  Laterality: Right;    FAMILY HISTORY: Family History  Problem Relation Age of Onset  . Breast cancer Paternal Aunt     56's    ADVANCED DIRECTIVES (Y/N):  N  HEALTH MAINTENANCE: Social History  Substance Use Topics  . Smoking status: Current Every Day Smoker    Types: E-cigarettes  . Smokeless tobacco: Never Used  . Alcohol use Yes     Colonoscopy:  PAP:  Bone density:  Lipid panel:  Allergies  Allergen Reactions  . Aspirin Nausea Only    Stomach burning  . Meloxicam Nausea Only  . Nsaids Nausea Only  . Furosemide Palpitations  . Latex Itching, Rash and Other (See Comments)    SNEEZING  .  Tape Rash    Current Outpatient Prescriptions  Medication Sig Dispense Refill  . albuterol (VENTOLIN HFA) 108 (90 BASE) MCG/ACT inhaler Inhale 2 puffs into the lungs 4 (four) times daily.     . calcium carbonate (CALCIUM 600) 600 MG TABS tablet Take 1 tablet (600 mg total) by mouth 2 (two) times daily with a meal. 60 tablet 6  . Cholecalciferol (VITAMIN D3) 2000 UNITS capsule Take by mouth.    . diphenhydrAMINE (BENADRYL) 25 mg capsule Take 25 mg by mouth as needed.     . enalapril (VASOTEC) 20 MG tablet Take 20 mg by mouth every morning.     Marland Kitchen exemestane (AROMASIN) 25 MG tablet TAKE ONE TABLET BY MOUTH ONCE DAILY AFTER BREAKFAST 30 tablet 0  . gabapentin (NEURONTIN) 300 MG capsule Take 300 mg by mouth 3  (three) times daily.    Marland Kitchen glimepiride (AMARYL) 2 MG tablet Take 2 mg by mouth daily with breakfast.     . guaiFENesin (MUCINEX) 600 MG 12 hr tablet Take by mouth 2 (two) times daily as needed.    . hydrochlorothiazide (HYDRODIURIL) 25 MG tablet Take by mouth.    Marland Kitchen HYDROcodone-acetaminophen (NORCO/VICODIN) 5-325 MG per tablet Take 1-2 tablets by mouth every 6 (six) hours as needed.     Marland Kitchen levothyroxine (SYNTHROID, LEVOTHROID) 75 MCG tablet Take 75 mcg by mouth daily before breakfast.     . magnesium oxide (MAG-OX) 400 MG tablet TAKE ONE TABLET BY MOUTH TWICE DAILY    . metoCLOPramide (REGLAN) 5 MG tablet Take 5 mg by mouth 4 (four) times daily.    . metolazone (ZAROXOLYN) 2.5 MG tablet Take by mouth.    . metoprolol (LOPRESSOR) 50 MG tablet Take 50 mg by mouth 2 (two) times daily.     Marland Kitchen Phenylephrine-Acetaminophen 5-325 MG TABS Take by mouth as needed.    . potassium chloride (K-DUR) 10 MEQ tablet Take 10 mEq by mouth 4 (four) times daily.     . ranitidine (ZANTAC) 150 MG tablet Take by mouth.    . simvastatin (ZOCOR) 80 MG tablet Take 80 mg by mouth daily at 6 PM.     . torsemide (DEMADEX) 20 MG tablet Take by mouth.     No current facility-administered medications for this visit.     OBJECTIVE: Vitals:   12/19/15 1526  BP: 136/76  Pulse: 81  Resp: 18  Temp: 97.4 F (36.3 C)     There is no height or weight on file to calculate BMI.   There is no height or weight on file to calculate BSA.  ECOG FS:2 - Symptomatic, <50% confined to bed  Physical Exam  Constitutional:  Obese, in no apparent distress  HENT:  Head: Normocephalic and atraumatic.  Eyes: EOM are normal. Pupils are equal, round, and reactive to light.  Neck: Normal range of motion. Neck supple.  Cardiovascular: Normal rate and regular rhythm.   Pulmonary/Chest: She exhibits tenderness.  Musculoskeletal: She exhibits no edema or deformity.  Psychiatric: She has a normal mood and affect. Her behavior is normal.     Breasts: . the left breast shows no signs of skin redness or edema, there is tenederss on deep palapation in the infraclavicular and left axillary area, suggesting a musculo skeletal origin  Right breast is non tender, no palpablle lumps No nipple irregularites were noted No arm edema was noted   No axiallry adenopathy was noted   LAB RESULTS:  Lab Results  Component Value Date  NA 138 06/26/2015   K 4.4 06/26/2015   CL 94 (L) 06/26/2015   CO2 29 06/26/2015   GLUCOSE 260 (H) 06/26/2015   BUN 30 (H) 06/26/2015   CREATININE 1.63 (H) 06/26/2015   CALCIUM 10.6 (H) 06/26/2015   PROT 8.6 (H) 06/26/2015   ALBUMIN 4.4 06/26/2015   AST 28 06/26/2015   ALT 24 06/26/2015   ALKPHOS 63 06/26/2015   BILITOT 0.9 06/26/2015   GFRNONAA 33 (L) 06/26/2015   GFRAA 38 (L) 06/26/2015    Lab Results  Component Value Date   WBC 9.5 12/19/2015   NEUTROABS 6.9 (H) 12/19/2015   HGB 12.9 12/19/2015   HCT 39.3 12/19/2015   MCV 78.1 (L) 12/19/2015   PLT 259 12/19/2015     STUDIES:   A B/L/ bilateral mammogram  From 01/2015 was benign. An ultrasound also done on the right at that time. She developed a post op seroma in the right breast which on her last [place] from dec 2016 showed decline in size.   ASSESSMENT:   Malignant neoplasm of lower-outer quadrant of female breast [facility]) Right breast ER positive cancer - lumpectomy 07/2014, Sentinal lymph node biopsy biopsy. No  radiation was given  Tried letrozole for a brief period, intolerant - switched to Aromasin since 10/2014   I will request a rib x-ray on the left , bialetral breast ultraosund and a bone scan, return in 2 weeks to review Advised her to not lay on the left side.   PLAN:    Patient expressed understanding and was in agreement with this plan. She also understands that She can call clinic at any time with any questions, concerns, or complaints.  Orders Placed This Encounter  Procedures  . DG Ribs Unilateral Left     Standing Status:   Future    Standing Expiration Date:   02/17/2017    Order Specific Question:   Reason for Exam (SYMPTOM  OR DIAGNOSIS REQUIRED)    Answer:   pain inteh left infraclavicular an dinfrascapular area, h/o breast cancer    Order Specific Question:   Preferred imaging location?    Answer:   [facility]    Standing Status:   Future    Standing Expiration Date:   12/18/2016    Order Specific Question:   Reason for Exam (SYMPTOM  OR DIAGNOSIS REQUIRED)    Answer:   active breast cancer , with increasign boen pains inteh left chest - r/o bone mets    Order Specific Question:   Preferred imaging location?    Answer:   [facility]    Order Specific Question:   If indicated for the ordered procedure, I authorize the administration of a radiopharmaceutical per Radiology protocol    Answer:   Yes  . US Breast Complete Uni Left Inc Axilla    Standing Status:   Future    Standing Expiration Date:   02/17/2017    Order Specific Question:   Reason for Exam (SYMPTOM  OR DIAGNOSIS REQUIRED)    Answer:   lefty breast pain and tenderness with no palapble lumps,    Order Specific Question:   Preferred imaging location?    Answer:   [facility]  . US Breast Complete Uni Right Inc Axilla    Standing Status:   Future    Standing Expiration Date:   02/17/2017    Order Specific Question:   Reason for Exam (SYMPTOM  OR DIAGNOSIS REQUIRED)  Answer:   h/o seroma in eh rigth breast sinc ebiopsy- patient cocneredn about the size of her right breast and wants to know if the seroma is improved    Order Specific Question:   Preferred imaging location?    Answer:   [facility]    Return in about 2 weeks (around 01/02/2016). Cancer of right breast [facility])   Staging form: Breast, AJCC 7th Edition   - Clinical: Stage IA (T1b, N0, cM0(i+)) - Signed by Forest Gleason, MD on 09/15/2014  Creola Corn, MD   12/19/2015 5:38 PM

## 2015-12-19 NOTE — Progress Notes (Signed)
Patient is here for follow up, she has some question about breast pain.    She is needing a refill on Exemestane

## 2015-12-20 ENCOUNTER — Other Ambulatory Visit: Payer: Self-pay | Admitting: Hematology and Oncology

## 2015-12-20 LAB — CANCER ANTIGEN 27.29: CA 27.29: 34 U/mL (ref 0.0–38.6)

## 2015-12-22 ENCOUNTER — Other Ambulatory Visit: Payer: Self-pay | Admitting: *Deleted

## 2015-12-22 MED ORDER — EXEMESTANE 25 MG PO TABS: ORAL_TABLET | ORAL | 2 refills | Status: DC

## 2016-01-01 ENCOUNTER — Ambulatory Visit
Admission: RE | Admit: 2016-01-01 | Discharge: 2016-01-01 | Disposition: A | Discharge status: Home / Self Care | Payer: PRIVATE HEALTH INSURANCE | Source: Ambulatory Visit | Attending: Internal Medicine | Admitting: Internal Medicine

## 2016-01-01 ENCOUNTER — Encounter
Admission: RE | Admit: 2016-01-01 | Discharge: 2016-01-01 | Disposition: A | Discharge status: Home / Self Care | Payer: PRIVATE HEALTH INSURANCE | Source: Ambulatory Visit | Attending: Internal Medicine | Admitting: Internal Medicine

## 2016-01-01 DIAGNOSIS — C50511 Malignant neoplasm of lower-outer quadrant of right female breast: Secondary | ICD-10-CM | POA: Insufficient documentation

## 2016-01-01 DIAGNOSIS — M25512 Pain in left shoulder: Secondary | ICD-10-CM | POA: Diagnosis present

## 2016-01-01 DIAGNOSIS — Z170 Estrogen receptor positive status [ER+]: Principal | ICD-10-CM

## 2016-01-01 MED ORDER — TECHNETIUM TC 99M MEDRONATE IV KIT
25.0000 | PACK | Freq: Once | INTRAVENOUS | Status: AC | PRN
  Administered 2016-01-01: 21.58 via INTRAVENOUS

## 2016-01-02 ENCOUNTER — Inpatient Hospital Stay: Payer: PRIVATE HEALTH INSURANCE | Attending: Internal Medicine | Admitting: Internal Medicine

## 2016-01-02 VITALS — BP 145/81 | HR 81 | Temp 96.9°F | Resp 18 | Wt 376.4 lb

## 2016-01-02 DIAGNOSIS — Z170 Estrogen receptor positive status [ER+]: Secondary | ICD-10-CM | POA: Diagnosis not present

## 2016-01-02 DIAGNOSIS — M2550 Pain in unspecified joint: Secondary | ICD-10-CM | POA: Diagnosis not present

## 2016-01-02 DIAGNOSIS — J449 Chronic obstructive pulmonary disease, unspecified: Secondary | ICD-10-CM

## 2016-01-02 DIAGNOSIS — Z803 Family history of malignant neoplasm of breast: Secondary | ICD-10-CM | POA: Insufficient documentation

## 2016-01-02 DIAGNOSIS — M109 Gout, unspecified: Secondary | ICD-10-CM | POA: Insufficient documentation

## 2016-01-02 DIAGNOSIS — E669 Obesity, unspecified: Secondary | ICD-10-CM | POA: Insufficient documentation

## 2016-01-02 DIAGNOSIS — N644 Mastodynia: Secondary | ICD-10-CM | POA: Diagnosis not present

## 2016-01-02 DIAGNOSIS — M549 Dorsalgia, unspecified: Secondary | ICD-10-CM

## 2016-01-02 DIAGNOSIS — R0602 Shortness of breath: Secondary | ICD-10-CM | POA: Insufficient documentation

## 2016-01-02 DIAGNOSIS — F17210 Nicotine dependence, cigarettes, uncomplicated: Secondary | ICD-10-CM | POA: Diagnosis not present

## 2016-01-02 DIAGNOSIS — M129 Arthropathy, unspecified: Secondary | ICD-10-CM | POA: Diagnosis not present

## 2016-01-02 DIAGNOSIS — I509 Heart failure, unspecified: Secondary | ICD-10-CM | POA: Insufficient documentation

## 2016-01-02 DIAGNOSIS — Z79899 Other long term (current) drug therapy: Secondary | ICD-10-CM | POA: Diagnosis not present

## 2016-01-02 DIAGNOSIS — E119 Type 2 diabetes mellitus without complications: Secondary | ICD-10-CM | POA: Insufficient documentation

## 2016-01-02 DIAGNOSIS — E039 Hypothyroidism, unspecified: Secondary | ICD-10-CM | POA: Diagnosis not present

## 2016-01-02 DIAGNOSIS — Z79811 Long term (current) use of aromatase inhibitors: Secondary | ICD-10-CM

## 2016-01-02 DIAGNOSIS — C50511 Malignant neoplasm of lower-outer quadrant of right female breast: Secondary | ICD-10-CM | POA: Insufficient documentation

## 2016-01-02 DIAGNOSIS — F329 Major depressive disorder, single episode, unspecified: Secondary | ICD-10-CM | POA: Diagnosis not present

## 2016-01-02 DIAGNOSIS — G4730 Sleep apnea, unspecified: Secondary | ICD-10-CM | POA: Diagnosis not present

## 2016-01-02 DIAGNOSIS — Z9011 Acquired absence of right breast and nipple: Secondary | ICD-10-CM | POA: Diagnosis not present

## 2016-01-02 DIAGNOSIS — K219 Gastro-esophageal reflux disease without esophagitis: Secondary | ICD-10-CM | POA: Diagnosis not present

## 2016-01-02 DIAGNOSIS — M519 Unspecified thoracic, thoracolumbar and lumbosacral intervertebral disc disorder: Secondary | ICD-10-CM

## 2016-01-02 DIAGNOSIS — F419 Anxiety disorder, unspecified: Secondary | ICD-10-CM | POA: Diagnosis not present

## 2016-01-02 NOTE — Progress Notes (Signed)
Patient here today for results.  

## 2016-01-15 ENCOUNTER — Ambulatory Visit
Admission: RE | Admit: 2016-01-15 | Discharge: 2016-01-15 | Disposition: A | Discharge status: Home / Self Care | Payer: PRIVATE HEALTH INSURANCE | Source: Ambulatory Visit | Attending: Oncology | Admitting: Oncology

## 2016-01-15 ENCOUNTER — Other Ambulatory Visit: Payer: Self-pay | Admitting: Internal Medicine

## 2016-01-15 DIAGNOSIS — C50911 Malignant neoplasm of unspecified site of right female breast: Secondary | ICD-10-CM

## 2016-01-15 DIAGNOSIS — Z170 Estrogen receptor positive status [ER+]: Principal | ICD-10-CM

## 2016-01-15 DIAGNOSIS — C50511 Malignant neoplasm of lower-outer quadrant of right female breast: Secondary | ICD-10-CM

## 2016-01-25 NOTE — Progress Notes (Signed)
[place]  [phone]) [phone] [phone]  ID: Teresa Holland OB: [birth year]  MR#: [id number]  PQZ#:[id number]  Patient Care Team: Rusty Aus, MD as PCP - General (Internal Medicine) Leonie Green, MD as Referring Physician (Surgery) Noreene Filbert, MD as Referring Physician (Radiation Oncology)  CHIEF COMPLAINT: Breast Cancer    INTERVAL HISTORY: She returns to review tests ordered at her last visit for b/l breast pain. She has tried to sleep on her right as suggested, but could not due to difficult positioning . The left breast pain perssts.  She has no new problems today  HPI; She was diagnosed with right breast cancer in June 2016 T1 N0 M0 ER positive PR positive and HER-2/neu negative. She had a lumpectomy but no further local regional therapy was given she could not lay flat due to morbid obesity for radiation she declined to have a mastectomy She began letrozole in September 2016 subsequently switched to Aromasin due to poor tolerance to Letrozole.   REVIEW OF SYSTEMS:   Review of Systems  Constitutional: Negative for chills, fever, malaise/fatigue and weight loss.  Musculoskeletal: Positive for back pain and joint pain.  Skin: Negative for rash.  All other systems reviewed and are negative.   As per HPI. Otherwise, a complete review of systems is negative.  PAST MEDICAL HISTORY: Past Medical History:  Diagnosis Date  . Anxiety   . Arthritis   . Asthma   . Back pain   . Breast cancer ([place]) 2016   right breast  . Cancer ([place])    breast  . CHF (congestive heart failure) ([place])   . Complication of anesthesia    heart issues 2004 during gb surgery had to be revived  . COPD (chronic obstructive pulmonary disease) ([place])   . Depression   . Diabetes mellitus without complication ([place])   . GERD (gastroesophageal reflux disease)   . Gout   . Headache   . Hypertension   . Hypothyroidism   . Obesity   . Pain    chronic  back  . Shortness of breath dyspnea   . Sleep apnea    could not use cpap uses 1.5 l [place] at night    PAST SURGICAL HISTORY: Past Surgical History:  Procedure Laterality Date  . abd pain    . ABDOMINAL HYSTERECTOMY    . ANKLE FRACTURE SURGERY    . BACK SURGERY     lumbar fusion  . BREAST BIOPSY Left 2004   negative  . CHOLECYSTECTOMY    . OOPHORECTOMY    . PARTIAL MASTECTOMY WITH NEEDLE LOCALIZATION Right 09/20/2014   Procedure: PARTIAL MASTECTOMY WITH NEEDLE LOCALIZATION;  Surgeon: Leonie Green, MD;  Location: [facility] ORS;  Service: General;  Laterality: Right;  . SENTINEL NODE BIOPSY Right 09/20/2014   Procedure: SENTINEL NODE BIOPSY;  Surgeon: Leonie Green, MD;  Location: [facility] ORS;  Service: General;  Laterality: Right;    FAMILY HISTORY: Family History  Problem Relation Age of Onset  . Breast cancer Paternal Aunt     71's  . Breast cancer Maternal Aunt 68  . Breast cancer Maternal Aunt 67    ADVANCED DIRECTIVES (Y/N):  N  HEALTH MAINTENANCE: Social History  Substance Use Topics  . Smoking status: Current Every Day Smoker    Types: E-cigarettes  . Smokeless tobacco: Never Used  . Alcohol use Yes     Colonoscopy:  PAP:  Bone density:  Lipid panel:  Allergies  Allergen Reactions  .  Aspirin Nausea Only    Stomach burning  . Meloxicam Nausea Only  . Nsaids Nausea Only  . Furosemide Palpitations  . Latex Itching, Rash and Other (See Comments)    SNEEZING  . Tape Rash    Current Outpatient Prescriptions  Medication Sig Dispense Refill  . albuterol (VENTOLIN HFA) 108 (90 BASE) MCG/ACT inhaler Inhale 2 puffs into the lungs 4 (four) times daily.     . calcium carbonate (CALCIUM 600) 600 MG TABS tablet Take 1 tablet (600 mg total) by mouth 2 (two) times daily with a meal. 60 tablet 6  . Cholecalciferol (VITAMIN D3) 2000 UNITS capsule Take by mouth.    . diphenhydrAMINE (BENADRYL) 25 mg capsule Take 25 mg by mouth as needed.     . enalapril (VASOTEC)  20 MG tablet Take 20 mg by mouth every morning.     Marland Kitchen exemestane (AROMASIN) 25 MG tablet TAKE ONE TABLET BY MOUTH ONCE DAILY AFTER BREAKFAST 30 tablet 2  . gabapentin (NEURONTIN) 300 MG capsule Take 300 mg by mouth 3 (three) times daily.    Marland Kitchen glimepiride (AMARYL) 2 MG tablet Take 2 mg by mouth daily with breakfast.     . guaiFENesin (MUCINEX) 600 MG 12 hr tablet Take by mouth 2 (two) times daily as needed.    . hydrochlorothiazide (HYDRODIURIL) 25 MG tablet Take by mouth.    Marland Kitchen HYDROcodone-acetaminophen (NORCO/VICODIN) 5-325 MG per tablet Take 1-2 tablets by mouth every 6 (six) hours as needed.     Marland Kitchen levothyroxine (SYNTHROID, LEVOTHROID) 75 MCG tablet Take 75 mcg by mouth daily before breakfast.     . magnesium oxide (MAG-OX) 400 MG tablet TAKE ONE TABLET BY MOUTH TWICE DAILY    . metoCLOPramide (REGLAN) 5 MG tablet Take 5 mg by mouth 4 (four) times daily.    . metoprolol (LOPRESSOR) 50 MG tablet Take 50 mg by mouth 2 (two) times daily.     . potassium chloride (K-DUR) 10 MEQ tablet Take 10 mEq by mouth 4 (four) times daily.     . simvastatin (ZOCOR) 80 MG tablet Take 80 mg by mouth daily at 6 PM.     . torsemide (DEMADEX) 20 MG tablet Take by mouth.    . metolazone (ZAROXOLYN) 2.5 MG tablet Take by mouth.    . ranitidine (ZANTAC) 150 MG tablet Take by mouth.     No current facility-administered medications for this visit.     OBJECTIVE: Vitals:   01/02/16 1347  BP: (!) 145/81  Pulse: 81  Resp: 18  Temp: (!) 96.9 F (36.1 C)     Body mass index is 57.23 kg/m.   2.86 meters squared  ECOG FS:2 - Symptomatic, <50% confined to bed  Physical Exam  Constitutional:  Obese, in no apparent distress  Nursing note and vitals reviewed.   LAB RESULTS:  Lab Results  Component Value Date   NA 138 06/26/2015   K 4.4 06/26/2015   CL 94 (L) 06/26/2015   CO2 29 06/26/2015   GLUCOSE 260 (H) 06/26/2015   BUN 30 (H) 06/26/2015   CREATININE 1.63 (H) 06/26/2015   CALCIUM 10.6 (H) 06/26/2015     PROT 8.6 (H) 06/26/2015   ALBUMIN 4.4 06/26/2015   AST 28 06/26/2015   ALT 24 06/26/2015   ALKPHOS 63 06/26/2015   BILITOT 0.9 06/26/2015   GFRNONAA 33 (L) 06/26/2015   GFRAA 38 (L) 06/26/2015    Lab Results  Component Value Date   WBC 9.5 12/19/2015  NEUTROABS 6.9 (H) 12/19/2015   HGB 12.9 12/19/2015   HCT 39.3 12/19/2015   MCV 78.1 (L) 12/19/2015   PLT 259 12/19/2015   Rib Xray- no fractures Bone scan shows no suspicion for bone metastasis Breast US is pending.    ASSESSMENT:   Malignant neoplasm of lower-outer quadrant of female breast (HCC) Right breast ER positive cancer - lumpectomy 07/2014, Sentinal lymph node biopsy biopsy. No  radiation was given  Tried letrozole for a brief period, intolerant - switched to Aromasin since 10/2014  continued myalgias in the setting of chronic back  pain and persistent left chest wall pain  I have reviewed the results of bone scan dn X ray ribs,  I recommened physical therapy to improve her left arm mobility and to help improve her posture particularly while sleeping to avoid leaning to the left. If these measures do not help, she may have to be taken off of Aromasin to see if that helps, and then can consider switch to Tamoxifen. I am skeptical however since this pain predates the start of aromatase inhibitors in 2016  PLAN:    Patient expressed understanding and was in agreement with this plan. She also understands that She can call clinic at any time with any questions, concerns, or complaints.  Orders Placed This Encounter  Procedures  . Ambulatory referral to Physical Therapy    Referral Priority:   Routine    Referral Type:   Physical Medicine    Referral Reason:   Specialty Services Required    Requested Specialty:   Physical Therapy    Number of Visits Requested:   1    No Follow-up on file. Cancer of right breast (HCC)   Staging form: Breast, AJCC 7th Edition   - Clinical: Stage IA (T1b, N0, cM0(i+)) - Signed by  Janak Choksi, MD on 09/15/2014  Sirisha Perumandla, MD   01/25/2016 1:22 PM     

## 2016-02-01 IMAGING — CR DG CHEST 2V
2 series · 2 of 2 positions shown · non-contrast
Comparison: None.

CLINICAL DATA: Shortness of breath.

EXAM:
CHEST  2 VIEW

[chest pa]
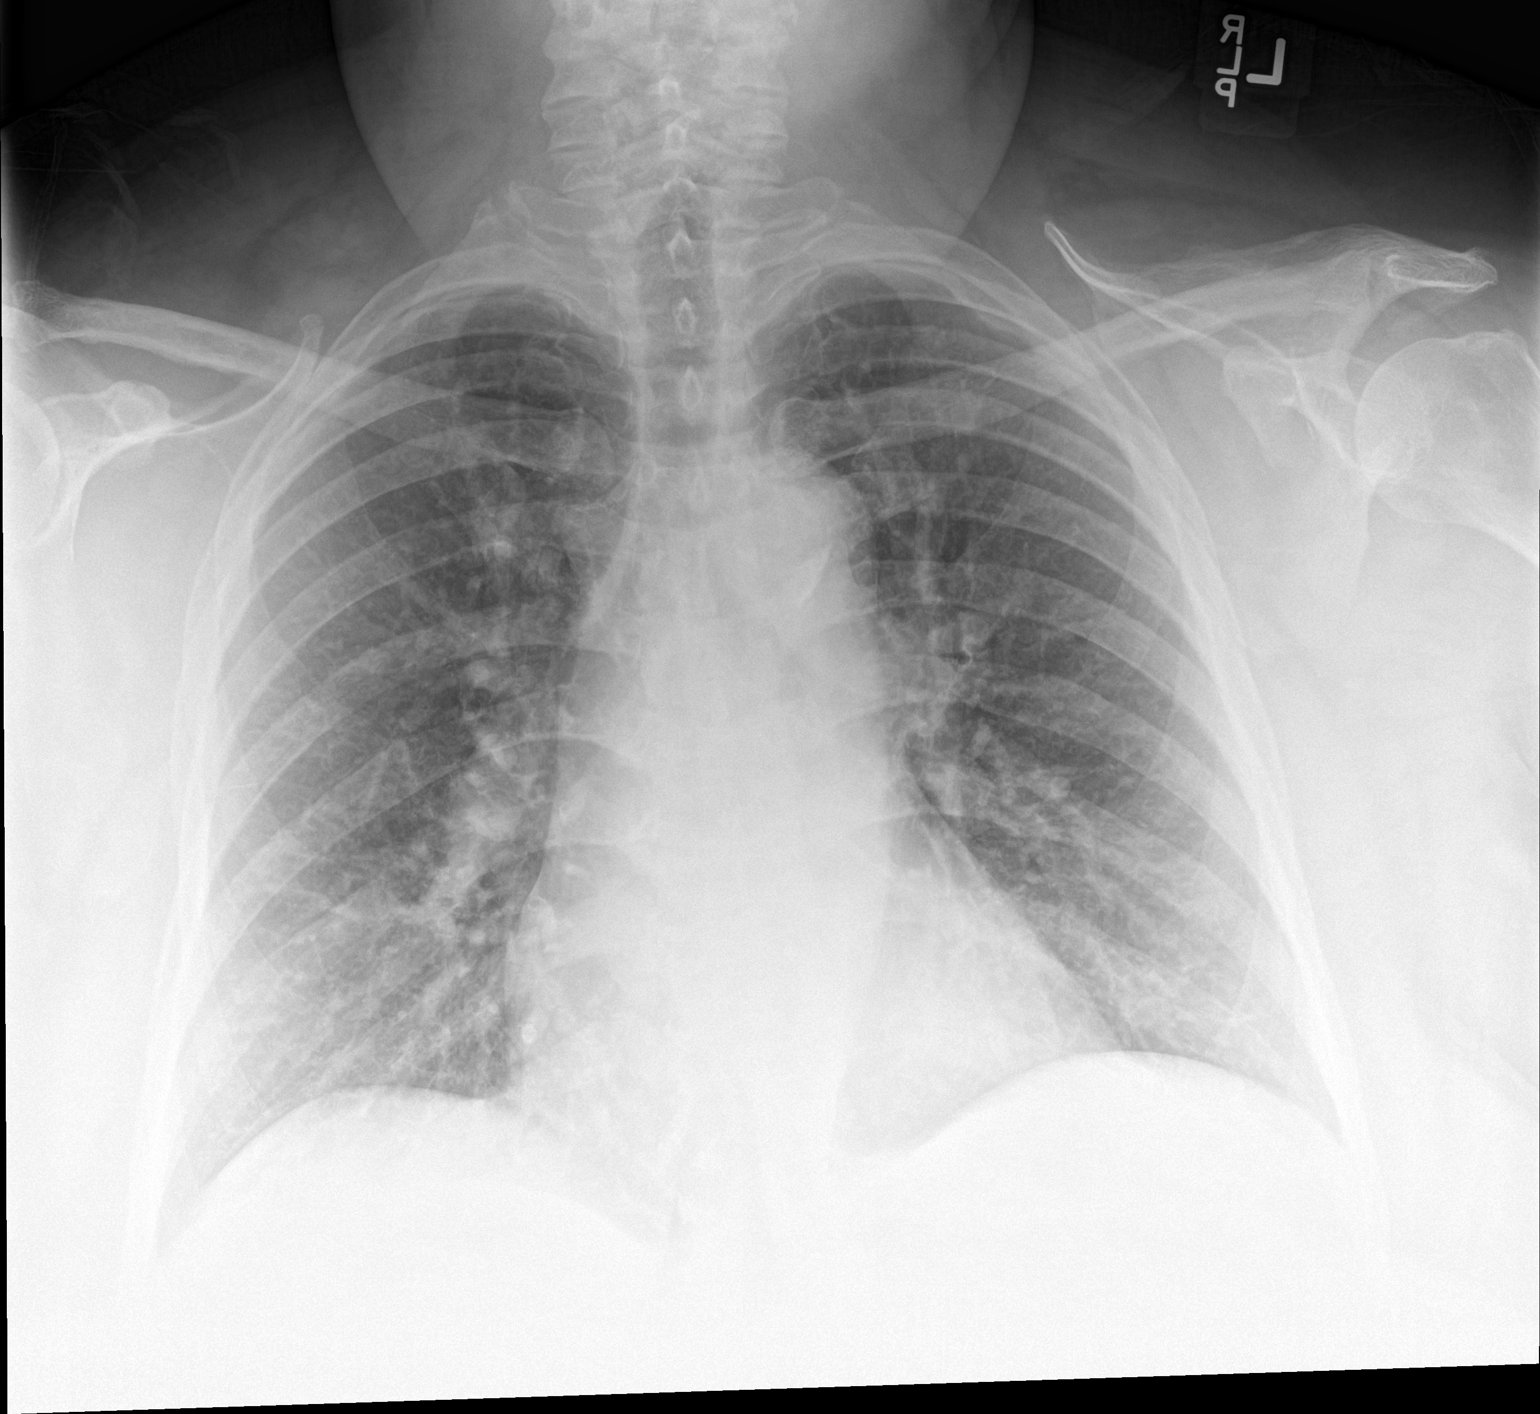

[chest lat]
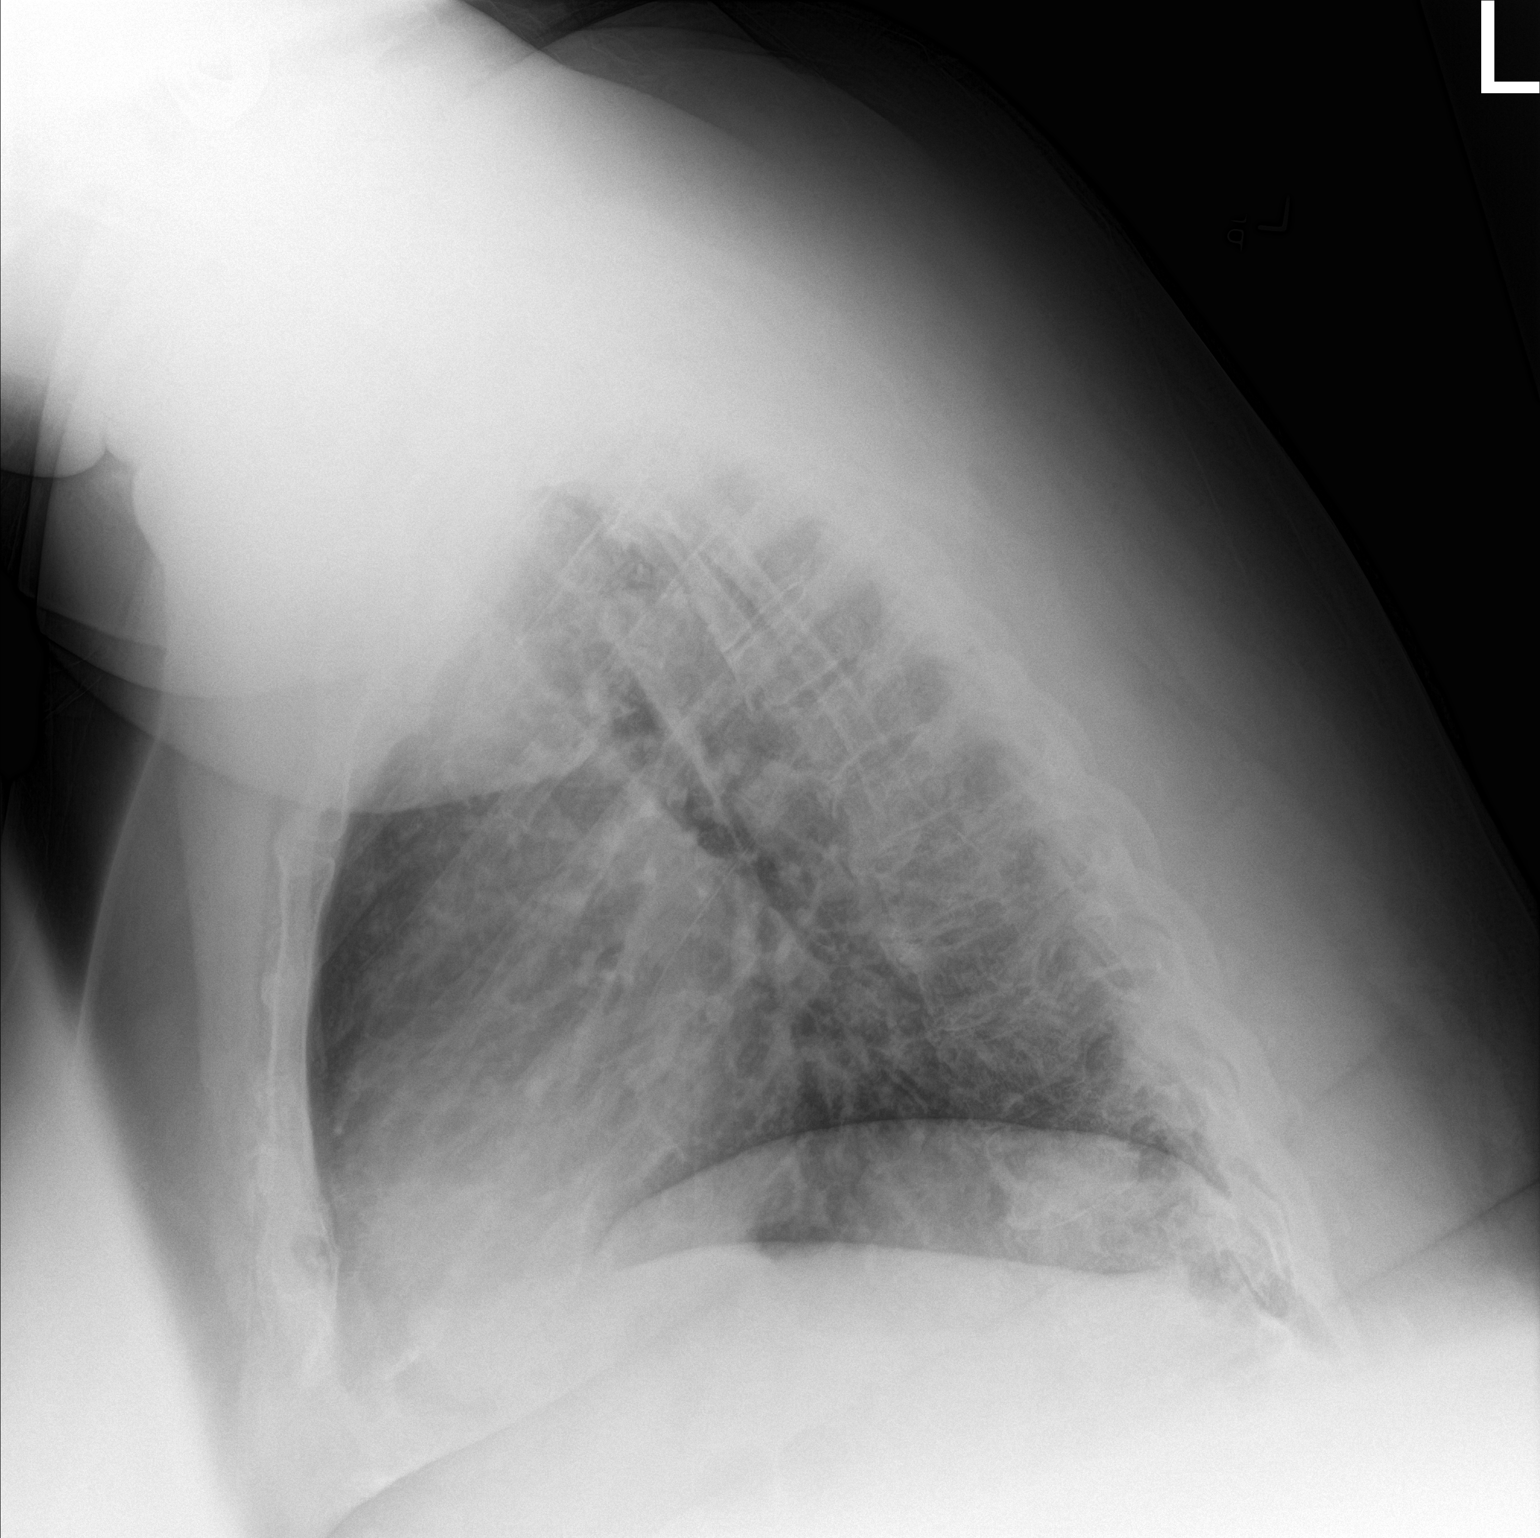

[2 of 2 positions shown; findings below may reference images not displayed]

FINDINGS: Mediastinum hilar structures normal. Mild cardiomegaly with mild
pulmonary vascular prominence interstitial prominence noted
suggesting mild congestive heart failure. Mild pneumonitis cannot be
excluded. No pleural effusion or pneumothorax. No acute bony
abnormality.
IMPRESSION: Mild cardiomegaly with mild pulmonary vascular prominence and
interstitial prominence suggesting mild congestive heart failure.
Pneumonitis cannot be excluded.

## 2016-02-05 ENCOUNTER — Encounter: Payer: Self-pay | Admitting: *Deleted

## 2016-02-06 ENCOUNTER — Ambulatory Visit: Payer: PRIVATE HEALTH INSURANCE | Admitting: Anesthesiology

## 2016-02-06 ENCOUNTER — Encounter
Admission: RE | Disposition: A | Discharge status: Home / Self Care | Payer: Self-pay | Source: Ambulatory Visit | Attending: Gastroenterology

## 2016-02-06 ENCOUNTER — Ambulatory Visit
Admission: RE | Admit: 2016-02-06 | Discharge: 2016-02-06 | Disposition: A | Discharge status: Home / Self Care | Payer: PRIVATE HEALTH INSURANCE | Source: Ambulatory Visit | Attending: Gastroenterology | Admitting: Gastroenterology

## 2016-02-06 DIAGNOSIS — J449 Chronic obstructive pulmonary disease, unspecified: Secondary | ICD-10-CM | POA: Insufficient documentation

## 2016-02-06 DIAGNOSIS — K5900 Constipation, unspecified: Secondary | ICD-10-CM | POA: Diagnosis not present

## 2016-02-06 DIAGNOSIS — K2960 Other gastritis without bleeding: Secondary | ICD-10-CM | POA: Diagnosis not present

## 2016-02-06 DIAGNOSIS — K2980 Duodenitis without bleeding: Secondary | ICD-10-CM | POA: Insufficient documentation

## 2016-02-06 DIAGNOSIS — E039 Hypothyroidism, unspecified: Secondary | ICD-10-CM | POA: Insufficient documentation

## 2016-02-06 DIAGNOSIS — K219 Gastro-esophageal reflux disease without esophagitis: Secondary | ICD-10-CM | POA: Insufficient documentation

## 2016-02-06 DIAGNOSIS — M1990 Unspecified osteoarthritis, unspecified site: Secondary | ICD-10-CM | POA: Diagnosis not present

## 2016-02-06 DIAGNOSIS — G4730 Sleep apnea, unspecified: Secondary | ICD-10-CM | POA: Insufficient documentation

## 2016-02-06 DIAGNOSIS — E119 Type 2 diabetes mellitus without complications: Secondary | ICD-10-CM | POA: Diagnosis not present

## 2016-02-06 DIAGNOSIS — Z853 Personal history of malignant neoplasm of breast: Secondary | ICD-10-CM | POA: Insufficient documentation

## 2016-02-06 DIAGNOSIS — Z7984 Long term (current) use of oral hypoglycemic drugs: Secondary | ICD-10-CM | POA: Diagnosis not present

## 2016-02-06 DIAGNOSIS — Z6841 Body Mass Index (BMI) 40.0 and over, adult: Secondary | ICD-10-CM | POA: Diagnosis not present

## 2016-02-06 DIAGNOSIS — Z79899 Other long term (current) drug therapy: Secondary | ICD-10-CM | POA: Insufficient documentation

## 2016-02-06 DIAGNOSIS — I110 Hypertensive heart disease with heart failure: Secondary | ICD-10-CM | POA: Insufficient documentation

## 2016-02-06 DIAGNOSIS — I509 Heart failure, unspecified: Secondary | ICD-10-CM | POA: Diagnosis not present

## 2016-02-06 DIAGNOSIS — K208 Other esophagitis: Secondary | ICD-10-CM | POA: Diagnosis present

## 2016-02-06 HISTORY — PX: ESOPHAGOGASTRODUODENOSCOPY (EGD) WITH PROPOFOL: SHX5813

## 2016-02-06 LAB — BASIC METABOLIC PANEL
Anion gap: 10 (ref 5–15)
BUN: 17 mg/dL (ref 6–20)
CHLORIDE: 98 mmol/L — AB (ref 101–111)
CO2: 30 mmol/L (ref 22–32)
CREATININE: 1.28 mg/dL — AB (ref 0.44–1.00)
Calcium: 9.6 mg/dL (ref 8.9–10.3)
GFR, EST AFRICAN AMERICAN: 50 mL/min — AB (ref >60)
GFR, EST NON AFRICAN AMERICAN: 44 mL/min — AB (ref >60)
Glucose, Bld: 184 mg/dL — ABNORMAL HIGH (ref 65–99)
Potassium: 4 mmol/L (ref 3.5–5.1)
SODIUM: 138 mmol/L (ref 135–145)

## 2016-02-06 LAB — GLUCOSE, CAPILLARY: Glucose-Capillary: 136 mg/dL — ABNORMAL HIGH (ref 65–99)

## 2016-02-06 LAB — MAGNESIUM: MAGNESIUM: 1.4 mg/dL — AB (ref 1.7–2.4)

## 2016-02-06 SURGERY — ESOPHAGOGASTRODUODENOSCOPY (EGD) WITH PROPOFOL
Anesthesia: General

## 2016-02-06 MED ORDER — FENTANYL CITRATE (PF) 100 MCG/2ML IJ SOLN
INTRAMUSCULAR | Status: DC | PRN
  Administered 2016-02-06: 50 ug via INTRAVENOUS

## 2016-02-06 MED ORDER — SODIUM CHLORIDE 0.9 % IV SOLN
INTRAVENOUS | Status: DC
  Administered 2016-02-06: 08:00:00 via INTRAVENOUS

## 2016-02-06 MED ORDER — MIDAZOLAM HCL 5 MG/5ML IJ SOLN
INTRAMUSCULAR | Status: DC | PRN
  Administered 2016-02-06: 1 mg via INTRAVENOUS

## 2016-02-06 MED ORDER — LIDOCAINE 2% (20 MG/ML) 5 ML SYRINGE
INTRAMUSCULAR | Status: DC | PRN
  Administered 2016-02-06: 25 mg via INTRAVENOUS

## 2016-02-06 MED ORDER — PROPOFOL 10 MG/ML IV BOLUS
INTRAVENOUS | Status: DC | PRN
  Administered 2016-02-06: 70 mg via INTRAVENOUS

## 2016-02-06 MED ORDER — KETAMINE HCL 50 MG/ML IJ SOLN
INTRAMUSCULAR | Status: DC | PRN
  Administered 2016-02-06: 25 mg via INTRAVENOUS

## 2016-02-06 MED ORDER — PROPOFOL 500 MG/50ML IV EMUL
INTRAVENOUS | Status: DC | PRN
  Administered 2016-02-06: 120 ug/kg/min via INTRAVENOUS

## 2016-02-06 MED ORDER — GLYCOPYRROLATE 0.2 MG/ML IJ SOLN
INTRAMUSCULAR | Status: DC | PRN
  Administered 2016-02-06: 0.2 mg via INTRAVENOUS

## 2016-02-06 NOTE — Op Note (Signed)
[facility] Gastroenterology Patient Name: Teresa Holland Procedure Date: 02/06/2016 7:52 AM MRN: [id number] Account #: [id number] Date of Birth: [birth year] Admit Type: Outpatient Age: 65 Room: [facility] ENDO ROOM 3 Gender: Female Note Status: Finalized Procedure:            Upper GI endoscopy Indications:          Epigastric abdominal pain Providers:            Lollie Sails, MD Referring MD:         Rusty Aus, MD (Referring MD) Medicines:            Monitored Anesthesia Care Complications:        No immediate complications. Procedure:            Pre-Anesthesia Assessment:                       - ASA Grade Assessment: IV - A patient with severe                        systemic disease that is a constant threat to life.                       After obtaining informed consent, the endoscope was                        passed under direct vision. Throughout the procedure,                        the patient's blood pressure, pulse, and oxygen                        saturations were monitored continuously. The Endoscope                        was introduced through the mouth, and advanced to the                        third part of duodenum. The upper GI endoscopy was                        accomplished without difficulty. The patient tolerated                        the procedure well. Findings:      LA Grade A (one or more mucosal breaks less than 5 mm, not extending       between tops of 2 mucosal folds) esophagitis with no bleeding was found.       Biopsies were taken with a cold forceps for histology.      Patchy mild inflammation characterized by adherent blood, congestion       (edema) and erythema was found in the gastric body and in the gastric       antrum. Biopsies were taken with a cold forceps for histology. Biopsies       were taken with a cold forceps for Helicobacter pylori testing.      The cardia and gastric fundus were normal on retroflexion.   Patchy mild inflammation characterized by congestion (edema) and       erythema was found in the duodenal bulb, in the second portion of the  duodenum and in the third portion of the duodenum. Biopsies were taken       with a cold forceps for histology.      Patchy candidiasis was found in the lower third of the esophagus. Impression:           - LA Grade A erosive esophagitis. Biopsied.                       - Gastritis. Biopsied.                       - Duodenitis. Biopsied. Recommendation:       - Await pathology results.                       - Use Aciphex (rabeprazole) 20 mg PO daily daily. Procedure Code(s):    --- Professional ---                       [phone], Esophagogastroduodenoscopy, flexible, transoral;                        with biopsy, single or multiple Diagnosis Code(s):    --- Professional ---                       K20.8, Other esophagitis                       K29.70, Gastritis, unspecified, without bleeding                       K29.80, Duodenitis without bleeding                       R10.13, Epigastric pain CPT copyright 2016 American Medical Association. All rights reserved. The codes documented in this report are preliminary and upon coder review may  be revised to meet current compliance requirements. Lollie Sails, MD 02/06/2016 8:13:19 AM This report has been signed electronically. Number of Addenda: 0 Note Initiated On: 02/06/2016 7:52 AM      [facility]

## 2016-02-06 NOTE — Anesthesia Preprocedure Evaluation (Signed)
Anesthesia Evaluation  Patient identified by MRN, date of birth, ID band Patient awake    Reviewed: Allergy & Precautions, NPO status , Patient's Chart, lab work & pertinent test results  History of Anesthesia Complications Negative for: history of anesthetic complications  Airway Mallampati: III  TM Distance: >3 FB Neck ROM: Full    Dental  (+) Lower Dentures, Upper Dentures   Pulmonary shortness of breath, asthma , sleep apnea and Continuous Positive Airway Pressure Ventilation , COPD,  COPD inhaler, Current Smoker,    breath sounds clear to auscultation- rhonchi (-) wheezing      Cardiovascular Exercise Tolerance: Poor hypertension, Pt. on medications (-) CAD, (-) Past MI and (-) Cardiac Stents  Rhythm:Regular Rate:Normal - Systolic murmurs and - Diastolic murmurs Echo 0000000: NORMAL LEFT VENTRICULAR SYSTOLIC FUNCTION NORMAL RIGHT VENTRICULAR SYSTOLIC FUNCTION MILD VALVULAR REGURGITATION (See above) NO VALVULAR STENOSIS Mildly reduced left ventricular function globally inferior hypokinesis ejection fraction between 45 and 50%   Neuro/Psych  Headaches, Anxiety Depression    GI/Hepatic Neg liver ROS, GERD  ,  Endo/Other  diabetes, Type 2, Oral Hypoglycemic AgentsHypothyroidism Morbid obesity  Renal/GU negative Renal ROS     Musculoskeletal  (+) Arthritis ,   Abdominal (+) + obese,   Peds  Hematology   Anesthesia Other Findings Past Medical History: No date: Anxiety No date: Arthritis No date: Asthma No date: Back pain 2016: Breast cancer ([place])     Comment: right breast No date: Cancer [facility])     Comment: breast No date: CHF (congestive heart failure) (HCC) No date: Complication of anesthesia     Comment: heart issues 2004 during gb surgery had to be               revived No date: COPD (chronic obstructive pulmonary disease) (* No date: Depression No date: Diabetes mellitus without complication (HCC) No  date: GERD (gastroesophageal reflux disease) No date: Gout No date: Headache No date: Hypertension No date: Hypothyroidism No date: Obesity No date: Pain     Comment: chronic back No date: Shortness of breath dyspnea No date: Sleep apnea     Comment: could not use cpap uses 1.5 l [place] at night   Reproductive/Obstetrics                             Anesthesia Physical Anesthesia Plan  ASA: IV  Anesthesia Plan: General   Post-op Pain Management:    Induction: Intravenous  Airway Management Planned: Natural Airway  Additional Equipment:   Intra-op Plan:   Post-operative Plan:   Informed Consent: I have reviewed the patients History and Physical, chart, labs and discussed the procedure including the risks, benefits and alternatives for the proposed anesthesia with the patient or authorized representative who has indicated his/her understanding and acceptance.   Dental advisory given  Plan Discussed with: CRNA and Anesthesiologist  Anesthesia Plan Comments: (Reviewed risk of respiratory complications given hx of OSA and morbid obesity)        Anesthesia Quick Evaluation

## 2016-02-06 NOTE — Transfer of Care (Signed)
Immediate Anesthesia Transfer of Care Note  Patient: Teresa Holland  Procedure(s) Performed: Procedure(s): ESOPHAGOGASTRODUODENOSCOPY (EGD) WITH PROPOFOL (N/A)  Patient Location: Endoscopy Unit  Anesthesia Type:General  Level of Consciousness: awake and alert   Airway & Oxygen Therapy: Patient Spontanous Breathing and Patient connected to face mask oxygen  Post-op Assessment: Report given to RN and Post -op Vital signs reviewed and stable  Post vital signs: Reviewed  Last Vitals:  Vitals:   02/06/16 0739 02/06/16 0815  BP: (!) 169/89 119/73  Pulse: 84 99  Resp: 19 16  Temp: 36.2 C 36.1 C    Last Pain:  Vitals:   02/06/16 0739  TempSrc: Tympanic         Complications: No apparent anesthesia complications

## 2016-02-06 NOTE — Anesthesia Postprocedure Evaluation (Signed)
Anesthesia Post Note  Patient: Teresa Holland  Procedure(s) Performed: Procedure(s) (LRB): ESOPHAGOGASTRODUODENOSCOPY (EGD) WITH PROPOFOL (N/A)  Patient location during evaluation: Endoscopy Anesthesia Type: General Level of consciousness: awake and alert Pain management: pain level controlled Vital Signs Assessment: post-procedure vital signs reviewed and stable Respiratory status: spontaneous breathing, nonlabored ventilation and respiratory function stable Cardiovascular status: blood pressure returned to baseline and stable Postop Assessment: no signs of nausea or vomiting Anesthetic complications: no    Last Vitals:  Vitals:   02/06/16 0739 02/06/16 0815  BP: (!) 169/89 119/73  Pulse: 84 99  Resp: 19 16  Temp: 36.2 C 36.1 C    Last Pain:  Vitals:   02/06/16 0739  TempSrc: Tympanic                 Janitza Revuelta

## 2016-02-06 NOTE — H&P (Signed)
Outpatient short stay form Pre-procedure 02/06/2016 7:31 AM Lollie Sails MD  Primary Physician: Dr. Emily Filbert  Reason for visit:  EGD  History of present illness:  Patient is a 65 year old female presenting today as above. She has a history of epigastric abdominal pain. She is diabetic and has been taking Reglan. This is markedly improved her symptoms. She has some intermittent issues with constipation. She has been on proton pump inhibitor in the past, not currently taking.. She takes no aspirin or blood thinning agents.    Current Facility-Administered Medications:  .  0.9 %  sodium chloride infusion, , Intravenous, Continuous, Lollie Sails, MD  Prescriptions Prior to Admission  Medication Sig Dispense Refill Last Dose  . dexlansoprazole (DEXILANT) 60 MG capsule Take 60 mg by mouth daily.     Marland Kitchen linaclotide (LINZESS) 290 MCG CAPS capsule Take 290 mcg by mouth daily before breakfast.     . liraglutide 18 MG/3ML SOPN Inject 0.6 mg into the skin daily.     Marland Kitchen tiotropium (SPIRIVA) 18 MCG inhalation capsule Place 18 mcg into inhaler and inhale daily.     Marland Kitchen albuterol (VENTOLIN HFA) 108 (90 BASE) MCG/ACT inhaler Inhale 2 puffs into the lungs 4 (four) times daily.    Taking  . calcium carbonate (CALCIUM 600) 600 MG TABS tablet Take 1 tablet (600 mg total) by mouth 2 (two) times daily with a meal. 60 tablet 6 Taking  . Cholecalciferol (VITAMIN D3) 2000 UNITS capsule Take by mouth.   Taking  . diphenhydrAMINE (BENADRYL) 25 mg capsule Take 25 mg by mouth as needed.    Taking  . enalapril (VASOTEC) 20 MG tablet Take 20 mg by mouth every morning.    Taking  . exemestane (AROMASIN) 25 MG tablet TAKE ONE TABLET BY MOUTH ONCE DAILY AFTER BREAKFAST 30 tablet 2 Taking  . gabapentin (NEURONTIN) 300 MG capsule Take 300 mg by mouth 3 (three) times daily.   Taking  . glimepiride (AMARYL) 2 MG tablet Take 2 mg by mouth daily with breakfast.    Taking  . guaiFENesin (MUCINEX) 600 MG 12 hr tablet Take  by mouth 2 (two) times daily as needed.   Taking  . hydrochlorothiazide (HYDRODIURIL) 25 MG tablet Take by mouth.   Taking  . HYDROcodone-acetaminophen (NORCO/VICODIN) 5-325 MG per tablet Take 1-2 tablets by mouth every 6 (six) hours as needed.    Taking  . levothyroxine (SYNTHROID, LEVOTHROID) 75 MCG tablet Take 75 mcg by mouth daily before breakfast.    Taking  . magnesium oxide (MAG-OX) 400 MG tablet TAKE ONE TABLET BY MOUTH TWICE DAILY   Taking  . metoCLOPramide (REGLAN) 5 MG tablet Take 5 mg by mouth 4 (four) times daily.   Taking  . metolazone (ZAROXOLYN) 2.5 MG tablet Take by mouth.   Taking  . metoprolol (LOPRESSOR) 50 MG tablet Take 50 mg by mouth 2 (two) times daily.    Taking  . potassium chloride (K-DUR) 10 MEQ tablet Take 10 mEq by mouth 4 (four) times daily.    Taking  . ranitidine (ZANTAC) 150 MG tablet Take by mouth.   Taking  . simvastatin (ZOCOR) 80 MG tablet Take 80 mg by mouth daily at 6 PM.    Taking  . torsemide (DEMADEX) 20 MG tablet Take by mouth.   Taking     Allergies  Allergen Reactions  . Aspirin Nausea Only    Stomach burning  . Furosemide Palpitations  . Latex Itching, Rash and Other (See Comments)  SNEEZING  . Meloxicam Nausea Only  . Nsaids Nausea Only  . Tape Rash     Past Medical History:  Diagnosis Date  . Anxiety   . Arthritis   . Asthma   . Back pain   . Breast cancer ([place]) 2016   right breast  . Cancer ([place])    breast  . CHF (congestive heart failure) ([place])   . Complication of anesthesia    heart issues 2004 during gb surgery had to be revived  . COPD (chronic obstructive pulmonary disease) ([place])   . Depression   . Diabetes mellitus without complication ([place])   . GERD (gastroesophageal reflux disease)   . Gout   . Headache   . Hypertension   . Hypothyroidism   . Obesity   . Pain    chronic back  . Shortness of breath dyspnea   . Sleep apnea    could not use cpap uses 1.5 l [place] at night    Review of systems:       Physical Exam    Heart and lungs: Regular rate and rhythm without rub or gallop, lungs are bilaterally clear.    HEENT: Normocephalic atraumatic eyes are anicteric    Other:     Pertinant exam for procedure: Obese tender to palpation to the left of the umbilicus. There are no masses or rebound. Possible small abdominal wall hernia at this point. Bowel sounds positive normoactive    Planned proceedures: EGD and indicated procedures. I have discussed the risks benefits and complications of procedures to include not limited to bleeding, infection, perforation and the risk of sedation and the patient wishes to proceed.    Lollie Sails, MD Gastroenterology 02/06/2016  7:31 AM

## 2016-02-09 ENCOUNTER — Encounter: Payer: Self-pay | Admitting: Gastroenterology

## 2016-02-09 LAB — SURGICAL PATHOLOGY

## 2016-02-28 IMAGING — US US BREAST LTD UNI RIGHT INC AXILLA
1 series · 4 of 4 positions shown · non-contrast
Comparison: Previous exam(s).

CLINICAL DATA: Followup right breast postoperative fluid collection
prior to MammoSite catheter placement.

EXAM:
ULTRASOUND OF THE RIGHT BREAST

[Series 1: us breast ltd uni right inc axilla · 0.09mm/px · 4 of 4 slices shown]
[im 1/4]
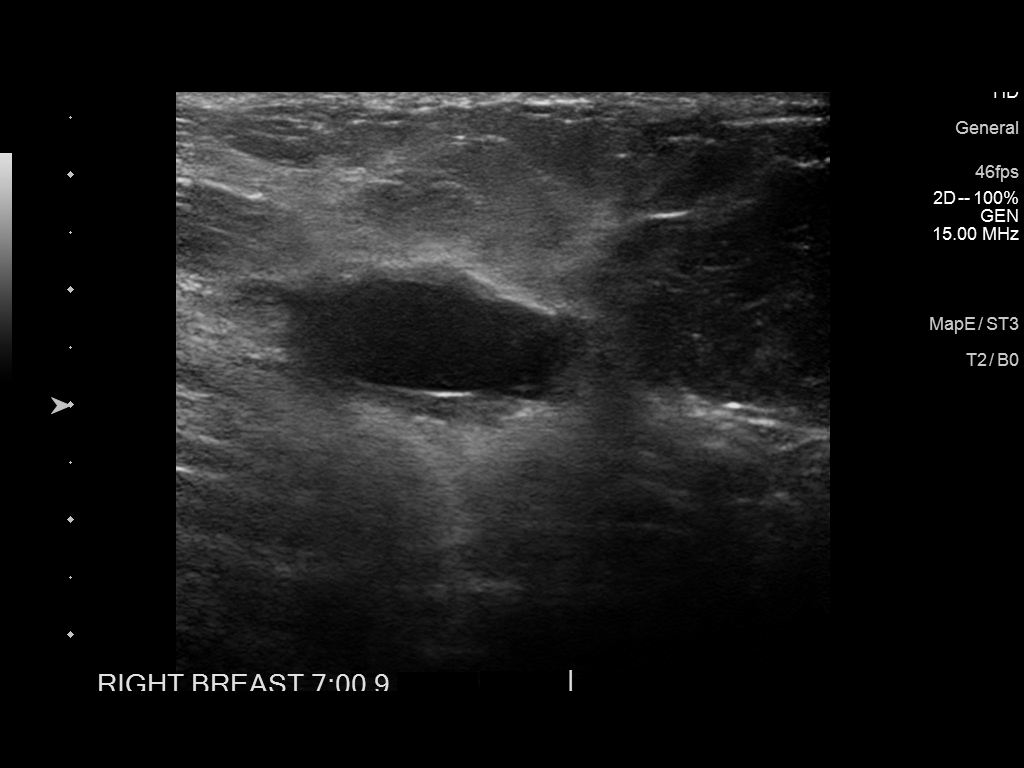
[im 2/4]
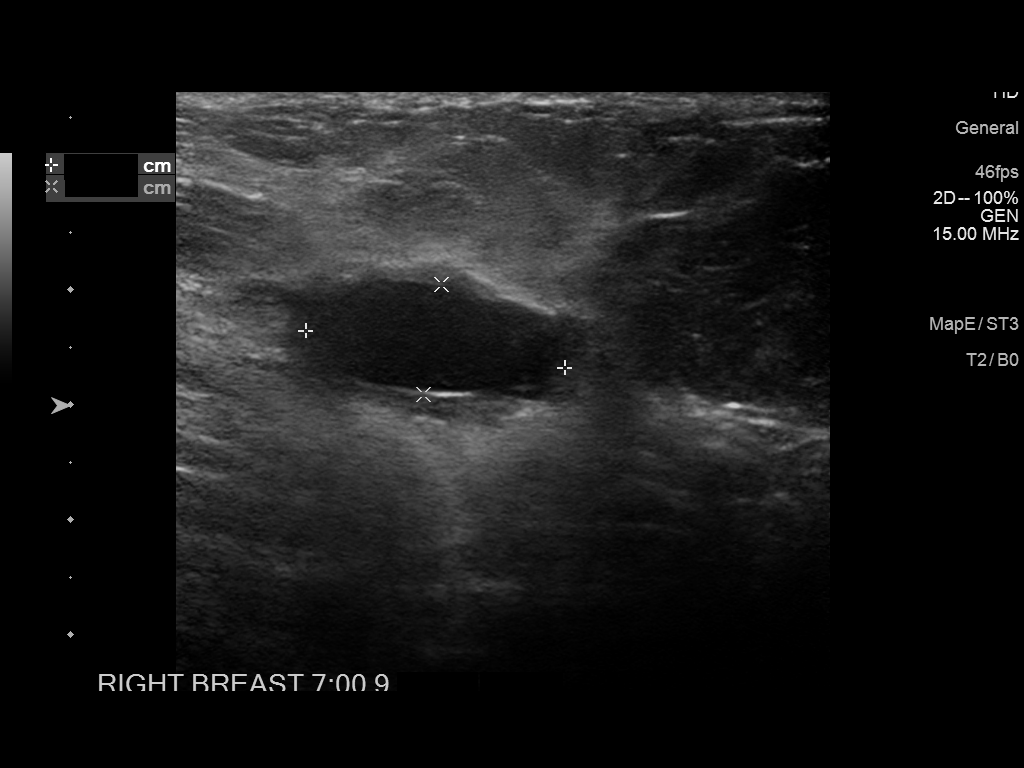
[im 3/4]
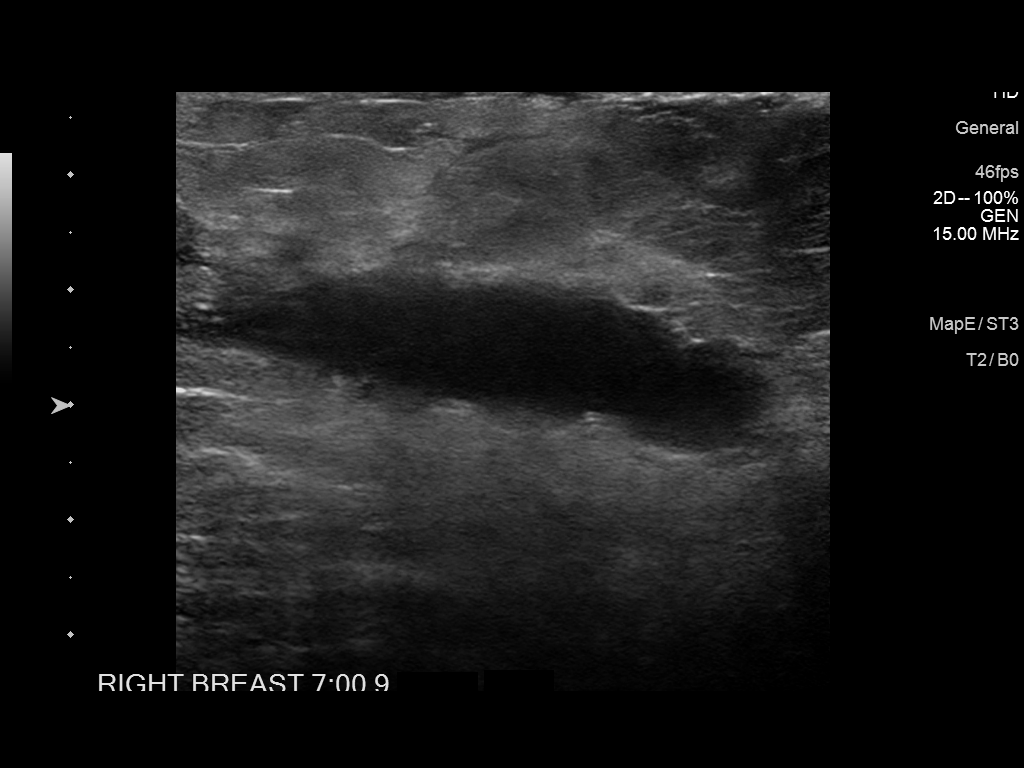
[im 4/4]
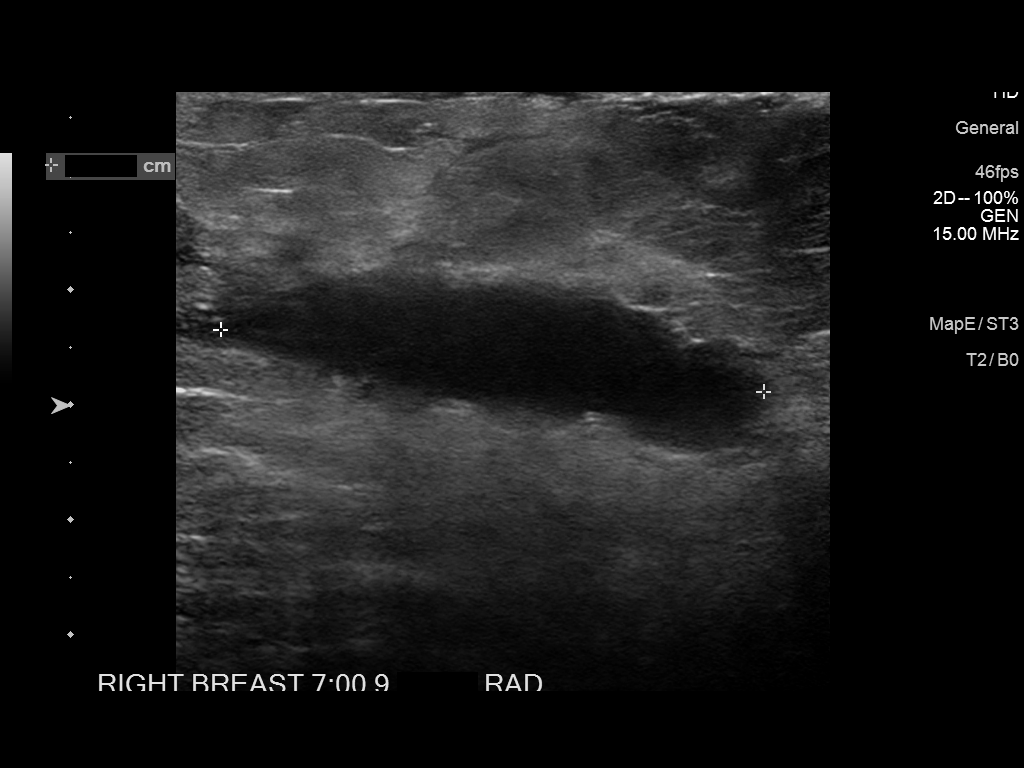

[4 of 4 positions shown; findings below may reference images not displayed]

FINDINGS: Physical examination demonstrates is persistent swelling involving
the inferior portion of the right breast. No palpable masses in the
lower outer right breast.

Targeted ultrasound of the right breast at 7 o'clock was performed.
The fluid collection has decreased in size and now measures 2.3 x 1
x 5 cm, previously 8.5 x 4.2 x 7.1 cm. This was difficult to measure
given patient's body habitus and extreme difficulty with
movement/positioning. There is a large amount of skin and
subcutaneous edema as well.
IMPRESSION: Right breast postoperative fluid collection now measures
approximately 2.3 x 1 x 5 cm.

RECOMMENDATION:
1. Treatment plan for right breast.

2.  Bilateral diagnostic mammography December 2014.

I have discussed the findings and recommendations with the patient.
Results were also provided in writing at the conclusion of the
visit. If applicable, a reminder letter will be sent to the patient
regarding the next appointment.

BI-RADS CATEGORY  2: Benign.

## 2016-03-02 ENCOUNTER — Ambulatory Visit: Payer: PRIVATE HEALTH INSURANCE

## 2016-03-02 ENCOUNTER — Other Ambulatory Visit: Payer: PRIVATE HEALTH INSURANCE

## 2016-03-04 ENCOUNTER — Ambulatory Visit: Payer: PRIVATE HEALTH INSURANCE | Admitting: Hematology and Oncology

## 2016-03-04 ENCOUNTER — Other Ambulatory Visit: Payer: PRIVATE HEALTH INSURANCE

## 2016-03-11 ENCOUNTER — Ambulatory Visit: Payer: PRIVATE HEALTH INSURANCE | Attending: Oncology

## 2016-03-23 ENCOUNTER — Other Ambulatory Visit: Payer: Self-pay | Admitting: Hematology and Oncology

## 2016-03-23 ENCOUNTER — Other Ambulatory Visit: Payer: Self-pay | Admitting: *Deleted

## 2016-03-23 MED ORDER — EXEMESTANE 25 MG PO TABS: ORAL_TABLET | ORAL | 0 refills | Status: DC

## 2016-04-12 ENCOUNTER — Other Ambulatory Visit: Payer: Self-pay | Admitting: Hematology and Oncology

## 2016-04-13 ENCOUNTER — Encounter: Payer: Self-pay | Admitting: Urology

## 2016-04-13 ENCOUNTER — Ambulatory Visit (INDEPENDENT_AMBULATORY_CARE_PROVIDER_SITE_OTHER): Payer: Medicare PPO | Admitting: Urology

## 2016-04-13 VITALS — BP 151/75 | HR 80 | Ht 68.0 in | Wt 364.1 lb

## 2016-04-13 DIAGNOSIS — N952 Postmenopausal atrophic vaginitis: Secondary | ICD-10-CM

## 2016-04-13 DIAGNOSIS — N3941 Urge incontinence: Secondary | ICD-10-CM | POA: Diagnosis not present

## 2016-04-13 DIAGNOSIS — R32 Unspecified urinary incontinence: Secondary | ICD-10-CM | POA: Diagnosis not present

## 2016-04-13 LAB — URINALYSIS, COMPLETE
BILIRUBIN UA: NEGATIVE
Glucose, UA: NEGATIVE
KETONES UA: NEGATIVE
NITRITE UA: NEGATIVE
Protein, UA: NEGATIVE
RBC UA: NEGATIVE
SPEC GRAV UA: 1.02 (ref 1.005–1.030)
UUROB: 0.2 mg/dL (ref 0.2–1.0)
pH, UA: 6 (ref 5.0–7.5)

## 2016-04-13 LAB — MICROSCOPIC EXAMINATION
Bacteria, UA: NONE SEEN
Epithelial Cells (non renal): >10 /hpf — AB (ref 0–10)

## 2016-04-13 LAB — BLADDER SCAN AMB NON-IMAGING: SCAN RESULT: 26

## 2016-04-13 NOTE — Progress Notes (Signed)
[id number] AM   Claud Kelp [name] [id number]  Referring provider: Rusty Aus, MD [facility] [address] [place], [place] [address]  Chief Complaint  Patient presents with  . New Patient (Initial Visit)    Urinary Incontinence referred by Dr. Emily Filbert    HPI: Patient is a 66 -year-old [place] American female who is referred to [place] by, Dr. Sabra Heck, for urinary incontinence who presents with her husband, [name].    Patient states that she has had urinary incontinence for the last three to four months.    Patient has incontinence with urgency.   She is experiencing  Several incontinent episodes during the day. She is experiencing 4 incontinent episodes during the night.  Her incontinence volume is large.   She is wearing greater than 10 pads/depends daily.    She is having associated urinary frequency, urgency, nocturia and intermittency.  She does not have a history of urinary tract infections, STI's or injury to the bladder.   She denies dysuria, gross hematuria, suprapubic pain, back pain, abdominal pain or flank pain.  She has not had any recent fevers, chills, nausea or vomiting.   She does not have a history of nephrolithiasis, GU surgery or GU trauma.   She is sexually active.  She has not noted incontinence with sexual intercourse.    She is post menopausal.     She admits to constipation.    She is not having pain with bladder filling.  She has not had any recent imaging studies.    She is drinking 64 oz of water daily.   She is drinking an occasional caffeinated beverages daily.  She is not drinking alcoholic beverages daily.    Her risk factors for incontinence are obesity, a family history of incontinence, age, caffeine, diabetes, depression, vaginal atrophy, pelvic surgery and nerve damage of the lumbar area.  She is taking antihistamines, decongestants, diuretics and antidepressants.    Her UA was unremarkable.   Her PVR was 26 mL.    PMH: Past Medical History:  Diagnosis Date  . Anxiety   . Arthritis   . Asthma   . Back pain   . Breast cancer ([place]) 2016   right breast  . Cancer ([place])    breast  . CHF (congestive heart failure) ([place])   . Complication of anesthesia    heart issues 2004 during gb surgery had to be revived  . COPD (chronic obstructive pulmonary disease) ([place])   . Depression   . Diabetes mellitus without complication ([place])   . GERD (gastroesophageal reflux disease)   . Gout   . Headache   . Hypertension   . Hypothyroidism   . Obesity   . Pain    chronic back  . Shortness of breath dyspnea   . Sleep apnea    could not use cpap uses 1.5 l [place] at night    Surgical History: Past Surgical History:  Procedure Laterality Date  . abd pain    . ABDOMINAL HYSTERECTOMY    . ANKLE FRACTURE SURGERY    . BACK SURGERY     lumbar fusion  . BREAST BIOPSY Left 2004   negative  . CHOLECYSTECTOMY    . ESOPHAGOGASTRODUODENOSCOPY (EGD) WITH PROPOFOL N/A 02/06/2016   Procedure: ESOPHAGOGASTRODUODENOSCOPY (EGD) WITH PROPOFOL;  Surgeon: Lollie Sails, MD;  Location: [facility] ENDOSCOPY;  Service: Endoscopy;  Laterality: N/A;  . MASTECTOMY Right 2016  . OOPHORECTOMY    . PARTIAL  MASTECTOMY WITH NEEDLE LOCALIZATION Right 09/20/2014   Procedure: PARTIAL MASTECTOMY WITH NEEDLE LOCALIZATION;  Surgeon: Leonie Green, MD;  Location: [facility] ORS;  Service: General;  Laterality: Right;  . SENTINEL NODE BIOPSY Right 09/20/2014   Procedure: SENTINEL NODE BIOPSY;  Surgeon: Leonie Green, MD;  Location: [facility] ORS;  Service: General;  Laterality: Right;    Home Medications:  Allergies as of 04/13/2016      Reactions   Aspirin Nausea Only   Stomach burning   Furosemide Palpitations   Latex Itching, Rash, Other (See Comments)   SNEEZING   Meloxicam Nausea Only   Nsaids Nausea Only   Tape Rash      Medication List       Accurate as of 04/13/16 11:53 AM. Always use your most recent med  list.          calcium carbonate 600 MG Tabs tablet Commonly known as:  CALCIUM 600 Take 1 tablet (600 mg total) by mouth 2 (two) times daily with a meal.   dexlansoprazole 60 MG capsule Commonly known as:  DEXILANT Take 60 mg by mouth daily.   diphenhydrAMINE 25 mg capsule Commonly known as:  BENADRYL Take 25 mg by mouth as needed.   enalapril 20 MG tablet Commonly known as:  VASOTEC Take 20 mg by mouth every morning.   exemestane 25 MG tablet Commonly known as:  AROMASIN TAKE ONE TABLET BY MOUTH ONCE DAILY AFTER  BREAKFAST   FIFTY50 PEN NEEDLES 31G X 8 MM Misc Generic drug:  Insulin Pen Needle Use once daily. Will use with victoza   gabapentin 300 MG capsule Commonly known as:  NEURONTIN Take 300 mg by mouth 3 (three) times daily.   glimepiride 2 MG tablet Commonly known as:  AMARYL Take 2 mg by mouth daily with breakfast.   guaiFENesin 600 MG 12 hr tablet Commonly known as:  MUCINEX Take by mouth 2 (two) times daily as needed.   hydrochlorothiazide 25 MG tablet Commonly known as:  HYDRODIURIL Take by mouth.   HYDROcodone-acetaminophen 5-325 MG tablet Commonly known as:  NORCO/VICODIN Take 1-2 tablets by mouth every 6 (six) hours as needed.   KLOR-CON M10 10 MEQ tablet Generic drug:  potassium chloride TAKE ONE TABLET BY MOUTH THREE TIMES DAILY   levothyroxine 75 MCG tablet Commonly known as:  SYNTHROID, LEVOTHROID Take 75 mcg by mouth daily before breakfast.   linaclotide 290 MCG Caps capsule Commonly known as:  LINZESS Take 290 mcg by mouth daily before breakfast.   liraglutide 18 MG/3ML Sopn Inject 0.6 mg into the skin daily.   magnesium oxide 400 MG tablet Commonly known as:  MAG-OX TAKE ONE TABLET BY MOUTH TWICE DAILY   metoCLOPramide 5 MG tablet Commonly known as:  REGLAN Take 5 mg by mouth 4 (four) times daily.   metolazone 2.5 MG tablet Commonly known as:  ZAROXOLYN Take by mouth.   metoprolol 50 MG tablet Commonly known as:   LOPRESSOR Take 50 mg by mouth 2 (two) times daily.   potassium chloride 10 MEQ tablet Commonly known as:  K-DUR Take 10 mEq by mouth 4 (four) times daily.   RABEprazole 20 MG tablet Commonly known as:  ACIPHEX Take by mouth.   ranitidine 150 MG tablet Commonly known as:  ZANTAC Take by mouth.   simvastatin 80 MG tablet Commonly known as:  ZOCOR Take 80 mg by mouth daily at 6 PM.   tiotropium 18 MCG inhalation capsule Commonly known as:  [place] 18  mcg into inhaler and inhale daily.   torsemide 20 MG tablet Commonly known as:  DEMADEX Take by mouth.   VENTOLIN HFA 108 (90 Base) MCG/ACT inhaler Generic drug:  albuterol Inhale 2 puffs into the lungs 4 (four) times daily.   Vitamin D3 2000 units capsule Take by mouth.       Allergies:  Allergies  Allergen Reactions  . Aspirin Nausea Only    Stomach burning  . Furosemide Palpitations  . Latex Itching, Rash and Other (See Comments)    SNEEZING  . Meloxicam Nausea Only  . Nsaids Nausea Only  . Tape Rash    Family History: Family History  Problem Relation Age of Onset  . Breast cancer Paternal Aunt     66's  . Breast cancer Maternal Aunt 68  . Breast cancer Maternal Aunt 67  . Prostate cancer Neg Hx   . Kidney cancer Neg Hx   . Bladder Cancer Neg Hx     Social History:  reports that she has quit smoking. Her smoking use included E-cigarettes. She has never used smokeless tobacco. She reports that she drinks alcohol. She reports that she does not use drugs.  ROS: UROLOGY Frequent Urination?: Yes Hard to postpone urination?: Yes Burning/pain with urination?: No Get up at night to urinate?: Yes Leakage of urine?: Yes Urine stream starts and stops?: No Trouble starting stream?: No Do you have to strain to urinate?: No Blood in urine?: No Urinary tract infection?: No Sexually transmitted disease?: No Injury to kidneys or bladder?: No Painful intercourse?: No Weak stream?: No Currently  pregnant?: No Vaginal bleeding?: No Last menstrual period?: n  Gastrointestinal Nausea?: Yes Vomiting?: No Indigestion/heartburn?: Yes Diarrhea?: Yes Constipation?: Yes  Constitutional Fever: No Night sweats?: Yes Weight loss?: No Fatigue?: Yes  Skin Skin rash/lesions?: No Itching?: Yes  Eyes Blurred vision?: Yes Double vision?: No  Ears/Nose/Throat Sore throat?: No Sinus problems?: Yes  Hematologic/Lymphatic Swollen glands?: No Easy bruising?: No  Cardiovascular Leg swelling?: Yes Chest pain?: No  Respiratory Cough?: Yes Shortness of breath?: Yes  Endocrine Excessive thirst?: Yes  Musculoskeletal Back pain?: Yes Joint pain?: Yes  Neurological Headaches?: Yes Dizziness?: No  Psychologic Depression?: No Anxiety?: No  Physical Exam: BP (!) 151/75   Pulse 80   Ht 5\' 8"  (1.727 m)   Wt (!) 364 lb 1.6 oz (165.2 kg)   BMI 55.36 kg/m   Constitutional: Well nourished. Alert and oriented, No acute distress. HEENT: [place] AT, moist mucus membranes. Trachea midline, no masses. Cardiovascular: No clubbing, cyanosis, or edema. Respiratory: Normal respiratory effort, no increased work of breathing. GI: Abdomen is soft, non tender, non distended, no abdominal masses. Liver and spleen not palpable.  No hernias appreciated.  Stool sample for occult testing is not indicated.   GU: No CVA tenderness.  No bladder fullness or masses.  Normal external genitalia, normal pubic hair distribution, no lesions.  Normal urethral meatus, no lesions, no prolapse, no discharge.   No urethral masses, tenderness and/or tenderness. No bladder fullness, tenderness or masses. Normal vagina mucosa, good estrogen effect, no discharge, no lesions, good pelvic support, no cystocele or rectocele noted.  Cervix, uterus and adnexa are surgically absent.  Anus and perineum are without rashes or lesions.    Skin: No rashes, bruises or suspicious lesions. Lymph: No cervical or inguinal  adenopathy. Neurologic: Grossly intact, no focal deficits, moving all 4 extremities. Psychiatric: Normal mood and affect.  Laboratory Data: Lab Results  Component Value Date   WBC 9.5  12/19/2015   HGB 12.9 12/19/2015   HCT 39.3 12/19/2015   MCV 78.1 (L) 12/19/2015   PLT 259 12/19/2015    Lab Results  Component Value Date   CREATININE 1.28 (H) 02/06/2016     Lab Results  Component Value Date   HGBA1C 8.0 (H) 10/23/2012     Lab Results  Component Value Date   AST 28 06/26/2015   Lab Results  Component Value Date   ALT 24 06/26/2015     Urinalysis Unremarkable.  See EPIC.    Pertinent Imaging: Results for KEAJA, FEGLEY (MRN [id number]) as of 04/13/2016 11:26  Ref. Range 04/13/2016 11:10  Scan Result Unknown 26    Assessment & Plan:    1. Incontinence  - offered behavioral therapies (weight loss - patient recently started on appetite suppressants), bladder training ( timed voiding),  bladder control strategies and pelvic floor muscle training - patient deferred PT  - fluid management - good water intake  - offered medical therapy with anticholinergic therapy or beta-3 adrenergic receptor agonist and the potential side effects of each therapy - not a candidate for medical therapy due to gut transit issues  - patient would like to try PTNS therapy   2. Vaginal atrophy  - patient has breast cancer and is not a candidate for vaginal estrogen cream  - advised to use olive/coconut oil for discomfort  Return for schedule PTNS.  These notes generated with voice recognition software. I apologize for typographical errors.  Zara Council, [facility] [address], [place], [place] [address] [phone]

## 2016-04-20 ENCOUNTER — Other Ambulatory Visit: Payer: Self-pay | Admitting: Hematology and Oncology

## 2016-04-20 ENCOUNTER — Telehealth: Payer: Self-pay | Admitting: *Deleted

## 2016-04-20 NOTE — Telephone Encounter (Signed)
Called patient and left a message that we have submitted a tier 2 exception with [employer] and that we would let her know when we hear back from them.

## 2016-04-22 ENCOUNTER — Telehealth: Payer: Self-pay | Admitting: *Deleted

## 2016-04-22 NOTE — Telephone Encounter (Signed)
Patient called back to set up appointments. Will be on Wednesdays at 3:30. Leodis Sias to set up all appointments for PTNS.

## 2016-04-22 NOTE — Telephone Encounter (Signed)
LMOM for patient to call office back about PTNS appointments. Patient has approval for procedure. Needs to be on wednesdays.

## 2016-04-26 ENCOUNTER — Telehealth: Payer: Self-pay | Admitting: *Deleted

## 2016-04-26 NOTE — Telephone Encounter (Signed)
Called and spoke with with husband, informed him that there was a tier 2 approval for the patients exemestane and that she should be able to get it filled with the lower co pay. Voiced understanding.

## 2016-05-05 ENCOUNTER — Encounter: Payer: Self-pay | Admitting: Urology

## 2016-05-05 ENCOUNTER — Ambulatory Visit (INDEPENDENT_AMBULATORY_CARE_PROVIDER_SITE_OTHER): Payer: Medicare PPO | Admitting: Urology

## 2016-05-05 VITALS — BP 143/88 | HR 88 | Ht 68.0 in | Wt 364.0 lb

## 2016-05-05 DIAGNOSIS — N3941 Urge incontinence: Secondary | ICD-10-CM

## 2016-05-05 NOTE — Progress Notes (Signed)
PTNS  Session # 1  Health & Social Factors:  Caffeine: 2 a week Alcohol: 0 Daytime voids #per day: 10+ Night-time voids #per night: 4-5 Urgency: severe Incontinence Episodes #per day: 0 Ankle used: right Treatment Setting: 7 Feeling/ Response: toe flex Comments:   Preformed By: Zara Council, PA, [place] PA student

## 2016-05-05 NOTE — Progress Notes (Addendum)
Chief Complaint:  Chief Complaint  Patient presents with  . PTNS    1     HPI: 66 yo AAF who presents today to start a 12 weekly course of PTNS treatments. #1/12 today.    Background history Patient is a 66 -year-old [place] American female who is referred to [place] by, Dr. Sabra Heck, for urinary incontinence who presents with her husband, [name].  Patient states that she has had urinary incontinence for the last three to four months.  Patient has incontinence with urgency.   She is experiencing  Several incontinent episodes during the day. She is experiencing 4 incontinent episodes during the night.  Her incontinence volume is large.   She is wearing greater than 10 pads/depends daily.  She is having associated urinary frequency, urgency, nocturia and intermittency.  She does not have a history of urinary tract infections, STI's or injury to the bladder.   She denies dysuria, gross hematuria, suprapubic pain, back pain, abdominal pain or flank pain.  She has not had any recent fevers, chills, nausea or vomiting.   She does not have a history of nephrolithiasis, GU surgery or GU trauma.  She is sexually active.  She has not noted incontinence with sexual intercourse.  She is post menopausal.   She admits to constipation.  She is not having pain with bladder filling.  She has not had any recent imaging studies.  She is drinking 64 oz of water daily.   She is drinking an occasional caffeinated beverages daily.  She is not drinking alcoholic beverages daily.    Her risk factors for incontinence are obesity, a family history of incontinence, age, caffeine, diabetes, depression, vaginal atrophy, pelvic surgery and nerve damage of the lumbar area.  She is taking antihistamines, decongestants, diuretics and antidepressants.    Her UA was unremarkable.  Her PVR was 26 mL.            Previous Therapy:     Behavioral Modification Techniques (discuss all modalities attempted with start/stop dates)      Pelvic Floor Exercises - patient deferred           Biofeedback - patient deferred           Bladder Training - advise patient to void every two hours while awake           Fluid Management - good water intake           Dietary Restrictions - NONE       Patient did not want to try medications for her incontinence due to gut transit issues.   Contraindications present for PTNS      Pacemaker - NO      Implantable defibrillator - NO      History of abnormal bleeding - NO      History of neuropathies or nerve damage - NO  Discussed with patient possible complications of procedure, such as discomfort, bleeding at insertion/stimulation site, procedure consent signed  Patient goals: To reduce the amount of incontinence episodes.   PMH: Past Medical History:  Diagnosis Date  . Anxiety   . Arthritis   . Asthma   . Back pain   . Breast cancer ([place]) 2016   right breast  . Cancer ([place])    breast  . CHF (congestive heart failure) ([place])   . Complication of anesthesia    heart issues 2004 during gb surgery had to be revived  . COPD (chronic obstructive pulmonary disease) ([place])   .  Depression   . Diabetes mellitus without complication ([place])   . GERD (gastroesophageal reflux disease)   . Gout   . Headache   . Hypertension   . Hypothyroidism   . Obesity   . Pain    chronic back  . Shortness of breath dyspnea   . Sleep apnea    could not use cpap uses 1.5 l [place] at night    Surgical History: Past Surgical History:  Procedure Laterality Date  . abd pain    . ABDOMINAL HYSTERECTOMY    . ANKLE FRACTURE SURGERY    . BACK SURGERY     lumbar fusion  . BREAST BIOPSY Left 2004   negative  . CHOLECYSTECTOMY    . ESOPHAGOGASTRODUODENOSCOPY (EGD) WITH PROPOFOL N/A 02/06/2016   Procedure: ESOPHAGOGASTRODUODENOSCOPY (EGD) WITH PROPOFOL;  Surgeon: Lollie Sails, MD;  Location: [facility] ENDOSCOPY;  Service: Endoscopy;  Laterality: N/A;  . MASTECTOMY Right 2016  . OOPHORECTOMY    .  PARTIAL MASTECTOMY WITH NEEDLE LOCALIZATION Right 09/20/2014   Procedure: PARTIAL MASTECTOMY WITH NEEDLE LOCALIZATION;  Surgeon: Leonie Green, MD;  Location: [facility] ORS;  Service: General;  Laterality: Right;  . SENTINEL NODE BIOPSY Right 09/20/2014   Procedure: SENTINEL NODE BIOPSY;  Surgeon: Leonie Green, MD;  Location: [facility] ORS;  Service: General;  Laterality: Right;    Home Medications:  Allergies as of 05/05/2016      Reactions   Aspirin Nausea Only   Stomach burning   Furosemide Palpitations   Latex Itching, Rash, Other (See Comments)   SNEEZING   Meloxicam Nausea Only   Nsaids Nausea Only   Tape Rash      Medication List       Accurate as of 05/05/16  4:14 PM. Always use your most recent med list.          calcium carbonate 600 MG Tabs tablet Commonly known as:  CALCIUM 600 Take 1 tablet (600 mg total) by mouth 2 (two) times daily with a meal.   dexlansoprazole 60 MG capsule Commonly known as:  DEXILANT Take 60 mg by mouth daily.   diphenhydrAMINE 25 mg capsule Commonly known as:  BENADRYL Take 25 mg by mouth as needed.   enalapril 20 MG tablet Commonly known as:  VASOTEC Take 20 mg by mouth every morning.   exemestane 25 MG tablet Commonly known as:  AROMASIN TAKE ONE TABLET BY MOUTH ONCE DAILY AFTER  BREAKFAST   FIFTY50 PEN NEEDLES 31G X 8 MM Misc Generic drug:  Insulin Pen Needle Use once daily. Will use with victoza   gabapentin 300 MG capsule Commonly known as:  NEURONTIN Take 300 mg by mouth 3 (three) times daily.   glimepiride 2 MG tablet Commonly known as:  AMARYL Take 2 mg by mouth daily with breakfast.   guaiFENesin 600 MG 12 hr tablet Commonly known as:  MUCINEX Take by mouth 2 (two) times daily as needed.   hydrochlorothiazide 25 MG tablet Commonly known as:  HYDRODIURIL Take by mouth.   HYDROcodone-acetaminophen 5-325 MG tablet Commonly known as:  NORCO/VICODIN Take 1-2 tablets by mouth every 6 (six) hours as needed.     KLOR-CON M10 10 MEQ tablet Generic drug:  potassium chloride TAKE ONE TABLET BY MOUTH THREE TIMES DAILY   levothyroxine 75 MCG tablet Commonly known as:  SYNTHROID, LEVOTHROID Take 75 mcg by mouth daily before breakfast.   linaclotide 290 MCG Caps capsule Commonly known as:  LINZESS Take 290 mcg by mouth daily before  breakfast.   liraglutide 18 MG/3ML Sopn Inject 0.6 mg into the skin daily.   magnesium oxide 400 MG tablet Commonly known as:  MAG-OX TAKE ONE TABLET BY MOUTH TWICE DAILY   metoCLOPramide 5 MG tablet Commonly known as:  REGLAN Take 5 mg by mouth 4 (four) times daily.   metolazone 2.5 MG tablet Commonly known as:  ZAROXOLYN Take by mouth.   metoprolol 50 MG tablet Commonly known as:  LOPRESSOR Take 50 mg by mouth 2 (two) times daily.   potassium chloride 10 MEQ tablet Commonly known as:  K-DUR Take 10 mEq by mouth 4 (four) times daily.   RABEprazole 20 MG tablet Commonly known as:  ACIPHEX Take by mouth.   ranitidine 150 MG tablet Commonly known as:  ZANTAC Take by mouth.   simvastatin 80 MG tablet Commonly known as:  ZOCOR Take 80 mg by mouth daily at 6 PM.   tiotropium 18 MCG inhalation capsule Commonly known as:  [place] Place 18 mcg into inhaler and inhale daily.   torsemide 20 MG tablet Commonly known as:  DEMADEX Take by mouth.   VENTOLIN HFA 108 (90 Base) MCG/ACT inhaler Generic drug:  albuterol Inhale 2 puffs into the lungs 4 (four) times daily.   Vitamin D3 2000 units capsule Take by mouth.       Allergies:  Allergies  Allergen Reactions  . Aspirin Nausea Only    Stomach burning  . Furosemide Palpitations  . Latex Itching, Rash and Other (See Comments)    SNEEZING  . Meloxicam Nausea Only  . Nsaids Nausea Only  . Tape Rash    Family History: Family History  Problem Relation Age of Onset  . Breast cancer Paternal Aunt     13's  . Breast cancer Maternal Aunt 68  . Breast cancer Maternal Aunt 67  . Prostate  cancer Neg Hx   . Kidney cancer Neg Hx   . Bladder Cancer Neg Hx     Social History:  reports that she has quit smoking. Her smoking use included E-cigarettes. She has never used smokeless tobacco. She reports that she drinks alcohol. She reports that she does not use drugs.  ROS: UROLOGY Frequent Urination?: Yes Hard to postpone urination?: Yes Burning/pain with urination?: No Get up at night to urinate?: Yes Leakage of urine?: Yes Urine stream starts and stops?: No Trouble starting stream?: No Do you have to strain to urinate?: No Blood in urine?: No Urinary tract infection?: No Sexually transmitted disease?: No Injury to kidneys or bladder?: No Painful intercourse?: No Weak stream?: No Currently pregnant?: No Vaginal bleeding?: No Last menstrual period?: n  Gastrointestinal Nausea?: No Vomiting?: No Indigestion/heartburn?: No Diarrhea?: Yes Constipation?: Yes  Constitutional Fever: No Night sweats?: Yes Weight loss?: No Fatigue?: No  Skin Skin rash/lesions?: No Itching?: Yes  Eyes Blurred vision?: Yes Double vision?: No  Ears/Nose/Throat Sore throat?: No Sinus problems?: Yes  Hematologic/Lymphatic Swollen glands?: No Easy bruising?: No  Cardiovascular Leg swelling?: Yes Chest pain?: No  Respiratory Cough?: Yes Shortness of breath?: Yes  Endocrine Excessive thirst?: No  Musculoskeletal Back pain?: Yes Joint pain?: Yes  Neurological Headaches?: Yes Dizziness?: No  Psychologic Depression?: No Anxiety?: No   Physical Exam: BP (!) 143/88   Pulse 88   Ht 5\' 8"  (1.727 m)   Wt (!) 364 lb (165.1 kg)   BMI 55.35 kg/m   Constitutional: Well nourished. Alert and oriented, No acute distress. HEENT: [place] AT, moist mucus membranes. Trachea midline, no masses. Cardiovascular:  No clubbing, cyanosis, or edema. Respiratory: Normal respiratory effort, no increased work of breathing. Skin: No rashes, bruises or suspicious lesions. Lymph: No  cervical or inguinal adenopathy. Neurologic: Grossly intact, no focal deficits, moving all 4 extremities. Psychiatric: Normal mood and affect.  Laboratory Data: Lab Results  Component Value Date   WBC 9.5 12/19/2015   HGB 12.9 12/19/2015   HCT 39.3 12/19/2015   MCV 78.1 (L) 12/19/2015   PLT 259 12/19/2015    Lab Results  Component Value Date   CREATININE 1.28 (H) 02/06/2016    Lab Results  Component Value Date   HGBA1C 8.0 (H) 10/23/2012    Lab Results  Component Value Date   AST 28 06/26/2015   Lab Results  Component Value Date   ALT 24 06/26/2015    PTNS treatment: The needle electrode was inserted into the lower, inner aspect of the patient's left leg. The surface electrode was placed on the inside arch of the foot on the treatment leg. The lead set was connected to the stimulator and the needle electrode clip was connected to the needle electrode. The stimulator that produces an adjustable electrical pulse that travels to the sacral nerve plexus via the tibial nerve was increased to 7 until the patient received a toe flex.     Assessment & Plan:   1. Incontinence  - Treatment Plan:  The needle electrode was removed without difficulty to the patient.  Patient tolerated the procedure for 30 minutes.  She will return next week for # 2 out of 12 of their weekly PTNS treatment's  Return in about 1 week (around 05/12/2016) for # 2 PTNS.  These notes generated with voice recognition software. I apologize for typographical errors.  Zara Council, [facility] [address] [place], [place] [address] [phone]

## 2016-05-11 NOTE — Progress Notes (Signed)
Chief Complaint:  Chief Complaint  Patient presents with  . PTNS    Urge incontinence     HPI: 66 yo AAF who presents today to start a 12 weekly course of PTNS treatments. #2/12 today.    Background history Patient is a 89 -year-old [place] American female who is referred to [place] by, Dr. Sabra Heck, for urinary incontinence who presents with her husband, [name].  Patient states that she has had urinary incontinence for the last three to four months.  Patient has incontinence with urgency.   She is experiencing  Several incontinent episodes during the day. She is experiencing 4 incontinent episodes during the night.  Her incontinence volume is large.   She is wearing greater than 10 pads/depends daily.  She is having associated urinary frequency, urgency, nocturia and intermittency.  She does not have a history of urinary tract infections, STI's or injury to the bladder.   She denies dysuria, gross hematuria, suprapubic pain, back pain, abdominal pain or flank pain.  She has not had any recent fevers, chills, nausea or vomiting.   She does not have a history of nephrolithiasis, GU surgery or GU trauma.  She is sexually active.  She has not noted incontinence with sexual intercourse.  She is post menopausal.   She admits to constipation.  She is not having pain with bladder filling.  She has not had any recent imaging studies.  She is drinking 64 oz of water daily.   She is drinking an occasional caffeinated beverages daily.  She is not drinking alcoholic beverages daily.    Her risk factors for incontinence are obesity, a family history of incontinence, age, caffeine, diabetes, depression, vaginal atrophy, pelvic surgery and nerve damage of the lumbar area.  She is taking antihistamines, decongestants, diuretics and antidepressants.    Her UA was unremarkable.  Her PVR was 26 mL.            Previous Therapy:     Behavioral Modification Techniques (discuss all modalities attempted with start/stop  dates)            Pelvic Floor Exercises - patient deferred           Biofeedback - patient deferred           Bladder Training - advise patient to void every two hours while awake           Fluid Management - good water intake           Dietary Restrictions - NONE       Patient did not want to try medications for her incontinence due to gut transit issues.   Contraindications present for PTNS      Pacemaker - NO      Implantable defibrillator - NO      History of abnormal bleeding - NO      History of neuropathies or nerve damage - NO  Discussed with patient possible complications of procedure, such as discomfort, bleeding at insertion/stimulation site, procedure consent signed  Patient goals: To reduce the amount of incontinence episodes.   Today, she is complaining of frequency, nocturia and incontinence.  After her last treatment, she is experiencing 8 day time voids, 4 night time voids and 3 incontinence episodes daily.    PMH: Past Medical History:  Diagnosis Date  . Anxiety   . Arthritis   . Asthma   . Back pain   . Breast cancer ([place]) 2016   right breast  .  Cancer ([facility])    breast  . CHF (congestive heart failure) ([place])   . Complication of anesthesia    heart issues 2004 during gb surgery had to be revived  . COPD (chronic obstructive pulmonary disease) ([place])   . Depression   . Diabetes mellitus without complication ([place])   . GERD (gastroesophageal reflux disease)   . Gout   . Headache   . Hypertension   . Hypothyroidism   . Obesity   . Pain    chronic back  . Shortness of breath dyspnea   . Sleep apnea    could not use cpap uses 1.5 l [place] at night    Surgical History: Past Surgical History:  Procedure Laterality Date  . abd pain    . ABDOMINAL HYSTERECTOMY    . ANKLE FRACTURE SURGERY    . BACK SURGERY     lumbar fusion  . BREAST BIOPSY Left 2004   negative  . CHOLECYSTECTOMY    . ESOPHAGOGASTRODUODENOSCOPY (EGD) WITH PROPOFOL N/A 02/06/2016    Procedure: ESOPHAGOGASTRODUODENOSCOPY (EGD) WITH PROPOFOL;  Surgeon: Lollie Sails, MD;  Location: [facility] ENDOSCOPY;  Service: Endoscopy;  Laterality: N/A;  . MASTECTOMY Right 2016  . OOPHORECTOMY     one ovary remaining  . PARTIAL MASTECTOMY WITH NEEDLE LOCALIZATION Right 09/20/2014   Procedure: PARTIAL MASTECTOMY WITH NEEDLE LOCALIZATION;  Surgeon: Leonie Green, MD;  Location: [facility] ORS;  Service: General;  Laterality: Right;  . SENTINEL NODE BIOPSY Right 09/20/2014   Procedure: SENTINEL NODE BIOPSY;  Surgeon: Leonie Green, MD;  Location: [facility] ORS;  Service: General;  Laterality: Right;    Home Medications:  Allergies as of 05/12/2016      Reactions   Aspirin Nausea Only   Stomach burning   Furosemide Palpitations   Latex Itching, Rash, Other (See Comments)   SNEEZING   Meloxicam Nausea Only   Nsaids Nausea Only   Tape Rash      Medication List       Accurate as of 05/12/16  4:04 PM. Always use your most recent med list.          calcium carbonate 600 MG Tabs tablet Commonly known as:  CALCIUM 600 Take 1 tablet (600 mg total) by mouth 2 (two) times daily with a meal.   dexlansoprazole 60 MG capsule Commonly known as:  DEXILANT Take 60 mg by mouth daily.   diphenhydrAMINE 25 mg capsule Commonly known as:  BENADRYL Take 25 mg by mouth as needed.   enalapril 20 MG tablet Commonly known as:  VASOTEC Take 20 mg by mouth every morning.   exemestane 25 MG tablet Commonly known as:  AROMASIN TAKE 1 TABLET BY MOUTH ONCE DAILY AFTER BREAKFAST   FIFTY50 PEN NEEDLES 31G X 8 MM Misc Generic drug:  Insulin Pen Needle Use once daily. Will use with victoza   gabapentin 300 MG capsule Commonly known as:  NEURONTIN Take 300 mg by mouth 3 (three) times daily.   glimepiride 2 MG tablet Commonly known as:  AMARYL Take 2 mg by mouth daily with breakfast.   guaiFENesin 600 MG 12 hr tablet Commonly known as:  MUCINEX Take by mouth 2 (two) times daily as needed.    hydrochlorothiazide 25 MG tablet Commonly known as:  HYDRODIURIL Take by mouth.   HYDROcodone-acetaminophen 5-325 MG tablet Commonly known as:  NORCO/VICODIN Take 1-2 tablets by mouth every 6 (six) hours as needed.   KLOR-CON M10 10 MEQ tablet Generic drug:  potassium chloride TAKE  ONE TABLET BY MOUTH THREE TIMES DAILY   levothyroxine 75 MCG tablet Commonly known as:  SYNTHROID, LEVOTHROID Take 75 mcg by mouth daily before breakfast.   linaclotide 290 MCG Caps capsule Commonly known as:  LINZESS Take 290 mcg by mouth daily before breakfast.   liraglutide 18 MG/3ML Sopn Inject 0.6 mg into the skin daily.   magnesium oxide 400 MG tablet Commonly known as:  MAG-OX TAKE ONE TABLET BY MOUTH TWICE DAILY   metoCLOPramide 5 MG tablet Commonly known as:  REGLAN Take 5 mg by mouth 4 (four) times daily.   metolazone 2.5 MG tablet Commonly known as:  ZAROXOLYN Take by mouth.   metoprolol 50 MG tablet Commonly known as:  LOPRESSOR Take 50 mg by mouth 2 (two) times daily.   potassium chloride 10 MEQ tablet Commonly known as:  K-DUR Take 10 mEq by mouth 4 (four) times daily.   RABEprazole 20 MG tablet Commonly known as:  ACIPHEX Take by mouth.   ranitidine 150 MG tablet Commonly known as:  ZANTAC Take by mouth.   simvastatin 80 MG tablet Commonly known as:  ZOCOR Take 80 mg by mouth daily at 6 PM.   tiotropium 18 MCG inhalation capsule Commonly known as:  SPIRIVA Place 18 mcg into inhaler and inhale daily.   torsemide 20 MG tablet Commonly known as:  DEMADEX Take by mouth.   VENTOLIN HFA 108 (90 Base) MCG/ACT inhaler Generic drug:  albuterol Inhale 2 puffs into the lungs 4 (four) times daily.   Vitamin D3 2000 units capsule Take by mouth.       Allergies:  Allergies  Allergen Reactions  . Aspirin Nausea Only    Stomach burning  . Furosemide Palpitations  . Latex Itching, Rash and Other (See Comments)    SNEEZING  . Meloxicam Nausea Only  . Nsaids  Nausea Only  . Tape Rash    Family History: Family History  Problem Relation Age of Onset  . Breast cancer Paternal Aunt     44's  . Breast cancer Maternal Aunt 68  . Breast cancer Maternal Aunt 67  . Colon cancer Mother   . Lung cancer Father   . Prostate cancer Neg Hx   . Kidney cancer Neg Hx   . Bladder Cancer Neg Hx     Social History:  reports that she has quit smoking. Her smoking use included E-cigarettes. She has never used smokeless tobacco. She reports that she drinks alcohol. She reports that she does not use drugs.  ROS: UROLOGY Frequent Urination?: Yes Hard to postpone urination?: No Burning/pain with urination?: No Get up at night to urinate?: Yes Leakage of urine?: Yes Urine stream starts and stops?: No Trouble starting stream?: No Do you have to strain to urinate?: No Blood in urine?: No Urinary tract infection?: No Sexually transmitted disease?: No Injury to kidneys or bladder?: No Painful intercourse?: No Weak stream?: No Currently pregnant?: No Vaginal bleeding?: No Last menstrual period?: n  Gastrointestinal Nausea?: No Vomiting?: No Indigestion/heartburn?: Yes Diarrhea?: No Constipation?: Yes  Constitutional Fever: No Night sweats?: Yes Weight loss?: No Fatigue?: No  Skin Skin rash/lesions?: No Itching?: Yes  Eyes Blurred vision?: Yes Double vision?: No  Ears/Nose/Throat Sore throat?: No Sinus problems?: Yes  Hematologic/Lymphatic Swollen glands?: No Easy bruising?: No  Cardiovascular Leg swelling?: Yes Chest pain?: No  Respiratory Cough?: Yes Shortness of breath?: Yes  Endocrine Excessive thirst?: No  Musculoskeletal Back pain?: Yes Joint pain?: Yes  Neurological Headaches?: No Dizziness?: No  Psychologic Depression?: No Anxiety?: No   Physical Exam: BP (!) 161/72   Pulse (!) 105   Ht 5\' 8"  (1.727 m)   Constitutional: Well nourished. Alert and oriented, No acute distress. HEENT: [place] AT, moist mucus  membranes. Trachea midline, no masses. Cardiovascular: No clubbing, cyanosis, or edema. Respiratory: Normal respiratory effort, no increased work of breathing. Skin: No rashes, bruises or suspicious lesions. Lymph: No cervical or inguinal adenopathy. Neurologic: Grossly intact, no focal deficits, moving all 4 extremities. Psychiatric: Normal mood and affect.  Laboratory Data: Lab Results  Component Value Date   WBC 9.5 12/19/2015   HGB 12.9 12/19/2015   HCT 39.3 12/19/2015   MCV 78.1 (L) 12/19/2015   PLT 259 12/19/2015    Lab Results  Component Value Date   CREATININE 1.28 (H) 02/06/2016    Lab Results  Component Value Date   HGBA1C 8.0 (H) 10/23/2012    Lab Results  Component Value Date   AST 28 06/26/2015   Lab Results  Component Value Date   ALT 24 06/26/2015    PTNS treatment: The needle electrode was inserted into the lower, inner aspect of the patient's left leg. The surface electrode was placed on the inside arch of the foot on the treatment leg. The lead set was connected to the stimulator and the needle electrode clip was connected to the needle electrode. The stimulator that produces an adjustable electrical pulse that travels to the sacral nerve plexus via the tibial nerve was increased to 5 until the patient received a toe flex.     Assessment & Plan:   1. Incontinence  - Treatment Plan:  The needle electrode was removed without difficulty to the patient.  Patient tolerated the procedure for 30 minutes.  She will return next week for # 3 out of 12 of their weekly PTNS treatment's  Return in about 1 week (around 05/19/2016) for # 3 PTNS.  These notes generated with voice recognition software. I apologize for typographical errors.  Zara Council, [facility] [address], [place], [place] [address] [phone]

## 2016-05-12 ENCOUNTER — Ambulatory Visit: Payer: Medicare PPO | Admitting: Urology

## 2016-05-12 ENCOUNTER — Other Ambulatory Visit: Payer: Self-pay | Admitting: Hematology and Oncology

## 2016-05-12 ENCOUNTER — Encounter: Payer: Self-pay | Admitting: Urology

## 2016-05-12 VITALS — BP 161/72 | HR 105 | Ht 68.0 in

## 2016-05-12 DIAGNOSIS — N3941 Urge incontinence: Secondary | ICD-10-CM | POA: Diagnosis not present

## 2016-05-12 LAB — PTNS-PERCUTANEOUS TIBIAL NERVE STIMULATION: Scan Result: 5

## 2016-05-12 NOTE — Progress Notes (Signed)
Patient refused being weighed.

## 2016-05-19 ENCOUNTER — Ambulatory Visit: Payer: Medicare PPO | Admitting: Urology

## 2016-05-19 NOTE — Progress Notes (Unsigned)
Chief Complaint:  No chief complaint on file.    HPI: 66 yo AAF who presents today to start a 12 weekly course of PTNS treatments. #3/12 today.    Background history Patient is a 39 -year-old [place] American female who is referred to [place] by, Dr. Sabra Heck, for urinary incontinence who presents with her husband, [name].  Patient states that she has had urinary incontinence for the last three to four months.  Patient has incontinence with urgency.   She is experiencing  Several incontinent episodes during the day. She is experiencing 4 incontinent episodes during the night.  Her incontinence volume is large.   She is wearing greater than 10 pads/depends daily.  She is having associated urinary frequency, urgency, nocturia and intermittency.  She does not have a history of urinary tract infections, STI's or injury to the bladder.   She denies dysuria, gross hematuria, suprapubic pain, back pain, abdominal pain or flank pain.  She has not had any recent fevers, chills, nausea or vomiting.   She does not have a history of nephrolithiasis, GU surgery or GU trauma.  She is sexually active.  She has not noted incontinence with sexual intercourse.  She is post menopausal.   She admits to constipation.  She is not having pain with bladder filling.  She has not had any recent imaging studies.  She is drinking 64 oz of water daily.   She is drinking an occasional caffeinated beverages daily.  She is not drinking alcoholic beverages daily.    Her risk factors for incontinence are obesity, a family history of incontinence, age, caffeine, diabetes, depression, vaginal atrophy, pelvic surgery and nerve damage of the lumbar area.  She is taking antihistamines, decongestants, diuretics and antidepressants.    Her UA was unremarkable.  Her PVR was 26 mL.            Previous Therapy:     Behavioral Modification Techniques (discuss all modalities attempted with start/stop dates)            Pelvic Floor Exercises -  patient deferred           Biofeedback - patient deferred           Bladder Training - advise patient to void every two hours while awake           Fluid Management - good water intake           Dietary Restrictions - NONE       Patient did not want to try medications for her incontinence due to gut transit issues.   Contraindications present for PTNS      Pacemaker - NO      Implantable defibrillator - NO      History of abnormal bleeding - NO      History of neuropathies or nerve damage - NO  Discussed with patient possible complications of procedure, such as discomfort, bleeding at insertion/stimulation site, procedure consent signed  Patient goals: To reduce the amount of incontinence episodes.   Today, she is complaining of ***.  Baseline symptoms consist of 10 daytime voids, 4-5 night time voids and 0 incontinence.  After her last treatment, she is experiencing *** day time voids (***), *** night time voids(***) and *** incontinence episodes daily (***).    PMH: Past Medical History:  Diagnosis Date  . Anxiety   . Arthritis   . Asthma   . Back pain   . Breast cancer ([place]) 2016  right breast  . Cancer ([place])    breast  . CHF (congestive heart failure) ([place])   . Complication of anesthesia    heart issues 2004 during gb surgery had to be revived  . COPD (chronic obstructive pulmonary disease) ([place])   . Depression   . Diabetes mellitus without complication ([place])   . GERD (gastroesophageal reflux disease)   . Gout   . Headache   . Hypertension   . Hypothyroidism   . Obesity   . Pain    chronic back  . Shortness of breath dyspnea   . Sleep apnea    could not use cpap uses 1.5 l  at night    Surgical History: Past Surgical History:  Procedure Laterality Date  . abd pain    . ABDOMINAL HYSTERECTOMY    . ANKLE FRACTURE SURGERY    . BACK SURGERY     lumbar fusion  . BREAST BIOPSY Left 2004   negative  . CHOLECYSTECTOMY    . ESOPHAGOGASTRODUODENOSCOPY (EGD)  WITH PROPOFOL N/A 02/06/2016   Procedure: ESOPHAGOGASTRODUODENOSCOPY (EGD) WITH PROPOFOL;  Surgeon: Lollie Sails, MD;  Location[facility] ENDOSCOPY;  Service: Endoscopy;  Laterality: N/A;  . MASTECTOMY Right 2016  . OOPHORECTOMY     one ovary remaining  . PARTIAL MASTECTOMY WITH NEEDLE LOCALIZATION Right 09/20/2014   Procedure: PARTIAL MASTECTOMY WITH NEEDLE LOCALIZATION;  Surgeon: [place]ie Green, MD;  Location: [facility] ORS;  Service: General;  Laterality: Right;  . SENTINEL NODE BIOPSY Right 09/20/2014   Procedure: SENTINEL NODE BIOPSY;  Surgeon: [place]ie Green, MD;  Location: [facility] ORS;  Service: General;  Laterality: Right;    Home Medications:  Allergies as of 05/19/2016      Reactions   Aspirin Nausea Only   Stomach burning   Furosemide Palpitations   Latex Itching, Rash, Other (See Comments)   SNEEZING   Meloxicam Nausea Only   Nsaids Nausea Only   Tape Rash      Medication List       Accurate as of 05/19/16  8:48 AM. Always use your most recent med list.          calcium carbonate 600 MG Tabs tablet Commonly known as:  CALCIUM 600 Take 1 tablet (600 mg total) by mouth 2 (two) times daily with a meal.   dexlansoprazole 60 MG capsule Commonly known as:  DEXILANT Take 60 mg by mouth daily.   diphenhydrAMINE 25 mg capsule Commonly known as:  BENADRYL Take 25 mg by mouth as needed.   enalapril 20 MG tablet Commonly known as:  VASOTEC Take 20 mg by mouth every morning.   exemestane 25 MG tablet Commonly known as:  AROMASIN TAKE 1 TABLET BY MOUTH ONCE DAILY AFTER BREAKFAST   FIFTY50 PEN NEEDLES 31G X 8 MM Misc Generic drug:  Insulin Pen Needle Use once daily. Will use with victoza   gabapentin 300 MG capsule Commonly known as:  NEURONTIN Take 300 mg by mouth 3 (three) times daily.   glimepiride 2 MG tablet Commonly known as:  AMARYL Take 2 mg by mouth daily with breakfast.   guaiFENesin 600 MG 12 hr tablet Commonly known as:  MUCINEX Take by mouth 2  (two) times daily as needed.   hydrochlorothiazide 25 MG tablet Commonly known as:  HYDRODIURIL Take by mouth.   HYDROcodone-acetaminophen 5-325 MG tablet Commonly known as:  NORCO/VICODIN Take 1-2 tablets by mouth every 6 (six) hours as needed.   KLOR-CON M10 10 MEQ tablet Generic drug:  potassium chloride TAKE ONE TABLET BY MOUTH THREE TIMES DAILY   levothyroxine 75 MCG tablet Commonly known as:  SYNTHROID, LEVOTHROID Take 75 mcg by mouth daily before breakfast.   linaclotide 290 MCG Caps capsule Commonly known as:  LINZESS Take 290 mcg by mouth daily before breakfast.   liraglutide 18 MG/3ML Sopn Inject 0.6 mg into the skin daily.   magnesium oxide 400 MG tablet Commonly known as:  MAG-OX TAKE ONE TABLET BY MOUTH TWICE DAILY   metoCLOPramide 5 MG tablet Commonly known as:  REGLAN Take 5 mg by mouth 4 (four) times daily.   metolazone 2.5 MG tablet Commonly known as:  ZAROXOLYN Take by mouth.   metoprolol 50 MG tablet Commonly known as:  LOPRESSOR Take 50 mg by mouth 2 (two) times daily.   potassium chloride 10 MEQ tablet Commonly known as:  K-DUR Take 10 mEq by mouth 4 (four) times daily.   RABEprazole 20 MG tablet Commonly known as:  ACIPHEX Take by mouth.   ranitidine 150 MG tablet Commonly known as:  ZANTAC Take by mouth.   simvastatin 80 MG tablet Commonly known as:  ZOCOR Take 80 mg by mouth daily at 6 PM.   tiotropium 18 MCG inhalation capsule Commonly known as:  SPIRIVA Place 18 mcg into inhaler and inhale daily.   torsemide 20 MG tablet Commonly known as:  DEMADEX Take by mouth.   VENTOLIN HFA 108 (90 Base) MCG/ACT inhaler Generic drug:  albuterol Inhale 2 puffs into the lungs 4 (four) times daily.   Vitamin D3 2000 units capsule Take by mouth.       Allergies:  Allergies  Allergen Reactions  . Aspirin Nausea Only    Stomach burning  . Furosemide Palpitations  . Latex Itching, Rash and Other (See Comments)    SNEEZING  .  Meloxicam Nausea Only  . Nsaids Nausea Only  . Tape Rash    Family History: Family History  Problem Relation Age of Onset  . Breast cancer Paternal Aunt     42's  . Breast cancer Maternal Aunt 68  . Breast cancer Maternal Aunt 67  . Colon cancer Mother   . Lung cancer Father   . Prostate cancer Neg Hx   . Kidney cancer Neg Hx   . Bladder Cancer Neg Hx     Social History:  reports that she has quit smoking. Her smoking use included E-cigarettes. She has never used smokeless tobacco. She reports that she drinks alcohol. She reports that she does not use drugs.  ROS:                                         Physical Exam: There were no vitals taken for this visit.  Constitutional: Well nourished. Alert and oriented, No acute distress. HEENT: [place] AT, moist mucus membranes. Trachea midline, no masses. Cardiovascular: No clubbing, cyanosis, or edema. Respiratory: Normal respiratory effort, no increased work of breathing. Skin: No rashes, bruises or suspicious lesions. Lymph: No cervical or inguinal adenopathy. Neurologic: Grossly intact, no focal deficits, moving all 4 extremities. Psychiatric: Normal mood and affect.  Laboratory Data: Lab Results  Component Value Date   WBC 9.5 12/19/2015   HGB 12.9 12/19/2015   HCT 39.3 12/19/2015   MCV 78.1 (L) 12/19/2015   PLT 259 12/19/2015    Lab Results  Component Value Date   CREATININE 1.28 (H) 02/06/2016  Lab Results  Component Value Date   HGBA1C 8.0 (H) 10/23/2012    Lab Results  Component Value Date   AST 28 06/26/2015   Lab Results  Component Value Date   ALT 24 06/26/2015    PTNS treatment: The needle electrode was inserted into the lower, inner aspect of the patient's *** leg. The surface electrode was placed on the inside arch of the foot on the treatment leg. The lead set was connected to the stimulator and the needle electrode clip was connected to the needle electrode. The  stimulator that produces an adjustable electrical pulse that travels to the sacral nerve plexus via the tibial nerve was increased to *** until the patient received a toe flex.     Assessment & Plan:   1. Incontinence  - Treatment Plan:  The needle electrode was removed without difficulty to the patient.  Patient tolerated the procedure for 30 minutes.  She will return next week for # 4 out of 12 of their weekly PTNS treatment's  No Follow-up on file.  These notes generated with voice recognition software. I apologize for typographical errors.  Zara Council, [facility] [address], [place], [place] [address] [phone]

## 2016-05-25 NOTE — Progress Notes (Signed)
Chief Complaint:  Chief Complaint  Patient presents with  . PTNS     HPI: 66 yo AAF who presents today to start a 12 weekly course of PTNS treatments. #3/12 today.    Background history Patient is a 72 -year-old [place] American female who is referred to [place] by, Dr. Sabra Heck, for urinary incontinence who presents with her husband, [name].  Patient states that she has had urinary incontinence for the last three to four months.  Patient has incontinence with urgency.   She is experiencing  Several incontinent episodes during the day. She is experiencing 4 incontinent episodes during the night.  Her incontinence volume is large.   She is wearing greater than 10 pads/depends daily.  She is having associated urinary frequency, urgency, nocturia and intermittency.  She does not have a history of urinary tract infections, STI's or injury to the bladder.   She denies dysuria, gross hematuria, suprapubic pain, back pain, abdominal pain or flank pain.  She has not had any recent fevers, chills, nausea or vomiting.   She does not have a history of nephrolithiasis, GU surgery or GU trauma.  She is sexually active.  She has not noted incontinence with sexual intercourse.  She is post menopausal.   She admits to constipation.  She is not having pain with bladder filling.  She has not had any recent imaging studies.  She is drinking 64 oz of water daily.   She is drinking an occasional caffeinated beverages daily.  She is not drinking alcoholic beverages daily.    Her risk factors for incontinence are obesity, a family history of incontinence, age, caffeine, diabetes, depression, vaginal atrophy, pelvic surgery and nerve damage of the lumbar area.  She is taking antihistamines, decongestants, diuretics and antidepressants.    Her UA was unremarkable.  Her PVR was 26 mL.            Previous Therapy:     Behavioral Modification Techniques (discuss all modalities attempted with start/stop dates)   Pelvic Floor Exercises - patient deferred           Biofeedback - patient deferred           Bladder Training - advise patient to void every two hours while awake           Fluid Management - good water intake           Dietary Restrictions - NONE       Patient did not want to try medications for her incontinence due to gut transit issues.   Contraindications present for PTNS      Pacemaker - NO      Implantable defibrillator - NO      History of abnormal bleeding - NO      History of neuropathies or nerve damage - NO  Discussed with patient possible complications of procedure, such as discomfort, bleeding at insertion/stimulation site, procedure consent signed  Patient goals: To reduce the amount of incontinence episodes.   Today, she is complaining of frequency, urgency, nocturia and incontinence.  Baseline symptoms consist of 10 daytime voids, 4-5 night time voids and 0 incontinence.  After her last treatment, she is experiencing 10 day time voids (stable), 3-5 night time voids (stable) and 5 incontinence episodes daily (worsening).    PMH: Past Medical History:  Diagnosis Date  . Anxiety   . Arthritis   . Asthma   . Back pain   . Breast cancer ([place]) 2016  right breast  . Cancer ([place])    breast  . CHF (congestive heart failure) ([place])   . Complication of anesthesia    heart issues 2004 during gb surgery had to be revived  . COPD (chronic obstructive pulmonary disease) ([place])   . Depression   . Diabetes mellitus without complication ([place])   . GERD (gastroesophageal reflux disease)   . Gout   . Headache   . Hypertension   . Hypothyroidism   . Obesity   . Pain    chronic back  . Shortness of breath dyspnea   . Sleep apnea    could not use cpap uses 1.5 l [place] at night    Surgical History: Past Surgical History:  Procedure Laterality Date  . abd pain    . ABDOMINAL HYSTERECTOMY    . ANKLE FRACTURE SURGERY    . BACK SURGERY     lumbar fusion  . BREAST BIOPSY Left  2004   negative  . CHOLECYSTECTOMY    . ESOPHAGOGASTRODUODENOSCOPY (EGD) WITH PROPOFOL N/A 02/06/2016   Procedure: ESOPHAGOGASTRODUODENOSCOPY (EGD) WITH PROPOFOL;  Surgeon: Lollie Sails, MD;  Location: [facility] ENDOSCOPY;  Service: Endoscopy;  Laterality: N/A;  . MASTECTOMY Right 2016  . OOPHORECTOMY     one ovary remaining  . PARTIAL MASTECTOMY WITH NEEDLE LOCALIZATION Right 09/20/2014   Procedure: PARTIAL MASTECTOMY WITH NEEDLE LOCALIZATION;  Surgeon: Leonie Green, MD;  Location: [facility] ORS;  Service: General;  Laterality: Right;  . SENTINEL NODE BIOPSY Right 09/20/2014   Procedure: SENTINEL NODE BIOPSY;  Surgeon: Leonie Green, MD;  Location: [facility] ORS;  Service: General;  Laterality: Right;    Home Medications:  Allergies as of 05/26/2016      Reactions   Aspirin Nausea Only   Stomach burning   Furosemide Palpitations   Latex Itching, Rash, Other (See Comments)   SNEEZING   Meloxicam Nausea Only   Nsaids Nausea Only   Tape Rash      Medication List       Accurate as of 05/26/16  4:05 PM. Always use your most recent med list.          calcium carbonate 600 MG Tabs tablet Commonly known as:  CALCIUM 600 Take 1 tablet (600 mg total) by mouth 2 (two) times daily with a meal.   dexlansoprazole 60 MG capsule Commonly known as:  DEXILANT Take 60 mg by mouth daily.   diphenhydrAMINE 25 mg capsule Commonly known as:  BENADRYL Take 25 mg by mouth as needed.   enalapril 20 MG tablet Commonly known as:  VASOTEC Take 20 mg by mouth every morning.   exemestane 25 MG tablet Commonly known as:  AROMASIN TAKE 1 TABLET BY MOUTH ONCE DAILY AFTER BREAKFAST   FIFTY50 PEN NEEDLES 31G X 8 MM Misc Generic drug:  Insulin Pen Needle Use once daily. Will use with victoza   gabapentin 300 MG capsule Commonly known as:  NEURONTIN Take 300 mg by mouth 3 (three) times daily.   glimepiride 2 MG tablet Commonly known as:  AMARYL Take 2 mg by mouth daily with breakfast.     guaiFENesin 600 MG 12 hr tablet Commonly known as:  MUCINEX Take by mouth 2 (two) times daily as needed.   hydrochlorothiazide 25 MG tablet Commonly known as:  HYDRODIURIL Take by mouth.   HYDROcodone-acetaminophen 5-325 MG tablet Commonly known as:  NORCO/VICODIN Take 1-2 tablets by mouth every 6 (six) hours as needed.   KLOR-CON M10 10 MEQ tablet Generic  drug:  potassium chloride TAKE ONE TABLET BY MOUTH THREE TIMES DAILY   levothyroxine 75 MCG tablet Commonly known as:  SYNTHROID, LEVOTHROID Take 75 mcg by mouth daily before breakfast.   linaclotide 290 MCG Caps capsule Commonly known as:  LINZESS Take 290 mcg by mouth daily before breakfast.   liraglutide 18 MG/3ML Sopn Inject 0.6 mg into the skin daily.   magnesium oxide 400 MG tablet Commonly known as:  MAG-OX TAKE ONE TABLET BY MOUTH TWICE DAILY   metoCLOPramide 5 MG tablet Commonly known as:  REGLAN Take 5 mg by mouth 4 (four) times daily.   metolazone 2.5 MG tablet Commonly known as:  ZAROXOLYN Take by mouth.   metoprolol 50 MG tablet Commonly known as:  LOPRESSOR Take 50 mg by mouth 2 (two) times daily.   potassium chloride 10 MEQ tablet Commonly known as:  K-DUR Take 10 mEq by mouth 4 (four) times daily.   RABEprazole 20 MG tablet Commonly known as:  ACIPHEX Take by mouth.   ranitidine 150 MG tablet Commonly known as:  ZANTAC Take by mouth.   simvastatin 80 MG tablet Commonly known as:  ZOCOR Take 80 mg by mouth daily at 6 PM.   tiotropium 18 MCG inhalation capsule Commonly known as:  SPIRIVA Place 18 mcg into inhaler and inhale daily.   torsemide 20 MG tablet Commonly known as:  DEMADEX Take by mouth.   VENTOLIN HFA 108 (90 Base) MCG/ACT inhaler Generic drug:  albuterol Inhale 2 puffs into the lungs 4 (four) times daily.   Vitamin D3 2000 units capsule Take by mouth.       Allergies:  Allergies  Allergen Reactions  . Aspirin Nausea Only    Stomach burning  . Furosemide  Palpitations  . Latex Itching, Rash and Other (See Comments)    SNEEZING  . Meloxicam Nausea Only  . Nsaids Nausea Only  . Tape Rash    Family History: Family History  Problem Relation Age of Onset  . Breast cancer Paternal Aunt     52's  . Breast cancer Maternal Aunt 68  . Breast cancer Maternal Aunt 67  . Colon cancer Mother   . Lung cancer Father   . Prostate cancer Neg Hx   . Kidney cancer Neg Hx   . Bladder Cancer Neg Hx     Social History:  reports that she has quit smoking. Her smoking use included E-cigarettes. She has never used smokeless tobacco. She reports that she drinks alcohol. She reports that she does not use drugs.  ROS: UROLOGY Frequent Urination?: Yes Hard to postpone urination?: Yes Burning/pain with urination?: No Get up at night to urinate?: Yes Leakage of urine?: Yes Urine stream starts and stops?: No Trouble starting stream?: No Do you have to strain to urinate?: No Blood in urine?: No Urinary tract infection?: No Sexually transmitted disease?: No Injury to kidneys or bladder?: No Painful intercourse?: No Weak stream?: No Currently pregnant?: No Vaginal bleeding?: No Last menstrual period?: n  Gastrointestinal Nausea?: Yes Vomiting?: No Indigestion/heartburn?: Yes Diarrhea?: Yes Constipation?: Yes  Constitutional Fever: No Night sweats?: Yes Weight loss?: No Fatigue?: No  Skin Skin rash/lesions?: No Itching?: Yes  Eyes Blurred vision?: Yes Double vision?: No  Ears/Nose/Throat Sore throat?: No Sinus problems?: Yes  Hematologic/Lymphatic Swollen glands?: No Easy bruising?: No  Cardiovascular Leg swelling?: Yes Chest pain?: No  Respiratory Cough?: Yes Shortness of breath?: Yes  Endocrine Excessive thirst?: No  Musculoskeletal Back pain?: Yes Joint pain?: Yes  Neurological  Headaches?: No Dizziness?: No  Psychologic Depression?: No Anxiety?: No   Physical Exam: BP 132/77   Pulse 85   Ht 5\' 8"   (1.727 m)   Wt (!) 364 lb 8 oz (165.3 kg)   BMI 55.42 kg/m   Constitutional: Well nourished. Alert and oriented, No acute distress. HEENT: [place] AT, moist mucus membranes. Trachea midline, no masses. Cardiovascular: No clubbing, cyanosis, or edema. Respiratory: Normal respiratory effort, no increased work of breathing. Skin: No rashes, bruises or suspicious lesions. Lymph: No cervical or inguinal adenopathy. Neurologic: Grossly intact, no focal deficits, moving all 4 extremities. Psychiatric: Normal mood and affect.  Laboratory Data: Lab Results  Component Value Date   WBC 9.5 12/19/2015   HGB 12.9 12/19/2015   HCT 39.3 12/19/2015   MCV 78.1 (L) 12/19/2015   PLT 259 12/19/2015    Lab Results  Component Value Date   CREATININE 1.28 (H) 02/06/2016    Lab Results  Component Value Date   HGBA1C 8.0 (H) 10/23/2012    Lab Results  Component Value Date   AST 28 06/26/2015   Lab Results  Component Value Date   ALT 24 06/26/2015    PTNS treatment: The needle electrode was inserted into the lower, inner aspect of the patient's left leg. The surface electrode was placed on the inside arch of the foot on the treatment leg. The lead set was connected to the stimulator and the needle electrode clip was connected to the needle electrode. The stimulator that produces an adjustable electrical pulse that travels to the sacral nerve plexus via the tibial nerve was increased to 12 until the patient received a toe flex.     Assessment & Plan:   1. Incontinence  - Treatment Plan:  The needle electrode was removed without difficulty to the patient.  Patient tolerated the procedure for 30 minutes.  She will return next week for # 4 out of 12 of their weekly PTNS treatment's  Return in about 1 week (around 06/02/2016) for # 4 PTNS.  These notes generated with voice recognition software. I apologize for typographical errors.  Zara Council, [facility] [address] [place], [place] [address] [phone]

## 2016-05-26 ENCOUNTER — Encounter: Payer: Self-pay | Admitting: Urology

## 2016-05-26 ENCOUNTER — Ambulatory Visit (INDEPENDENT_AMBULATORY_CARE_PROVIDER_SITE_OTHER): Payer: Medicare PPO | Admitting: Urology

## 2016-05-26 VITALS — BP 132/77 | HR 85 | Ht 68.0 in | Wt 364.5 lb

## 2016-05-26 DIAGNOSIS — N3941 Urge incontinence: Secondary | ICD-10-CM | POA: Diagnosis not present

## 2016-05-26 LAB — PTNS-PERCUTANEOUS TIBIAL NERVE STIMULATION: SCAN RESULT: 12

## 2016-05-26 NOTE — Progress Notes (Signed)
PTNS  Session # 4  Health & Social Factors: Change.  Caffeine: 2-3 a week Alcohol: 0 Daytime voids #per day: 10+ Night-time voids #per night: 3-5 Urgency: strong Incontinence Episodes #per day: 5 Ankle used: left Treatment Setting: 12 Feeling/ Response: toe flex Comments: Pt missed last week and regressed.  Preformed By: Zara Council, PA  Assistant: Toniann Fail, LPN   Follow Up: 1 week

## 2016-06-01 NOTE — Progress Notes (Deleted)
Chief Complaint:  No chief complaint on file.    HPI: 66 yo AAF who presents today to start a 12 weekly course of PTNS treatments. #4/12 today.    Background history Patient is a 54 -year-old [place] American female who is referred to [place] by, Dr. Sabra Heck, for urinary incontinence who presents with her husband, [name].  Patient states that she has had urinary incontinence for the last three to four months.  Patient has incontinence with urgency.   She is experiencing  Several incontinent episodes during the day. She is experiencing 4 incontinent episodes during the night.  Her incontinence volume is large.   She is wearing greater than 10 pads/depends daily.  She is having associated urinary frequency, urgency, nocturia and intermittency.  She does not have a history of urinary tract infections, STI's or injury to the bladder.   She denies dysuria, gross hematuria, suprapubic pain, back pain, abdominal pain or flank pain.  She has not had any recent fevers, chills, nausea or vomiting.   She does not have a history of nephrolithiasis, GU surgery or GU trauma.  She is sexually active.  She has not noted incontinence with sexual intercourse.  She is post menopausal.   She admits to constipation.  She is not having pain with bladder filling.  She has not had any recent imaging studies.  She is drinking 64 oz of water daily.   She is drinking an occasional caffeinated beverages daily.  She is not drinking alcoholic beverages daily.    Her risk factors for incontinence are obesity, a family history of incontinence, age, caffeine, diabetes, depression, vaginal atrophy, pelvic surgery and nerve damage of the lumbar area.  She is taking antihistamines, decongestants, diuretics and antidepressants.    Her UA was unremarkable.  Her PVR was 26 mL.            Previous Therapy:     Behavioral Modification Techniques (discuss all modalities attempted with start/stop dates)            Pelvic Floor Exercises -  patient deferred           Biofeedback - patient deferred           Bladder Training - advise patient to void every two hours while awake           Fluid Management - good water intake           Dietary Restrictions - NONE       Patient did not want to try medications for her incontinence due to gut transit issues.   Contraindications present for PTNS      Pacemaker - NO      Implantable defibrillator - NO      History of abnormal bleeding - NO      History of neuropathies or nerve damage - NO  Discussed with patient possible complications of procedure, such as discomfort, bleeding at insertion/stimulation site, procedure consent signed  Patient goals: To reduce the amount of incontinence episodes.   Today, she is complaining of frequency, urgency, nocturia and incontinence.  Baseline symptoms consist of 10 daytime voids, 4-5 night time voids and 0 incontinence.  After her last treatment, she is experiencing *** day time voids (***), *** night time voids (***) and *** incontinence episodes daily (***).    PMH: Past Medical History:  Diagnosis Date  . Anxiety   . Arthritis   . Asthma   . Back pain   .  Breast cancer ([place]) 2016   right breast  . Cancer ([place])    breast  . CHF (congestive heart failure) ([place])   . Complication of anesthesia    heart issues 2004 during gb surgery had to be revived  . COPD (chronic obstructive pulmonary disease) ([place])   . Depression   . Diabetes mellitus without complication ([place])   . GERD (gastroesophageal reflux disease)   . Gout   . Headache   . Hypertension   . Hypothyroidism   . Obesity   . Pain    chronic back  . Shortness of breath dyspnea   . Sleep apnea    could not use cpap uses 1.5 l [place] at night    Surgical History: Past Surgical History:  Procedure Laterality Date  . abd pain    . ABDOMINAL HYSTERECTOMY    . ANKLE FRACTURE SURGERY    . BACK SURGERY     lumbar fusion  . BREAST BIOPSY Left 2004   negative  .  CHOLECYSTECTOMY    . ESOPHAGOGASTRODUODENOSCOPY (EGD) WITH PROPOFOL N/A 02/06/2016   Procedure: ESOPHAGOGASTRODUODENOSCOPY (EGD) WITH PROPOFOL;  Surgeon: Lollie Sails, MD;  Location: [facility] ENDOSCOPY;  Service: Endoscopy;  Laterality: N/A;  . MASTECTOMY Right 2016  . OOPHORECTOMY     one ovary remaining  . PARTIAL MASTECTOMY WITH NEEDLE LOCALIZATION Right 09/20/2014   Procedure: PARTIAL MASTECTOMY WITH NEEDLE LOCALIZATION;  Surgeon: Leonie Green, MD;  Location: [facility] ORS;  Service: General;  Laterality: Right;  . SENTINEL NODE BIOPSY Right 09/20/2014   Procedure: SENTINEL NODE BIOPSY;  Surgeon: Leonie Green, MD;  Location: [facility] ORS;  Service: General;  Laterality: Right;    Home Medications:  Allergies as of 06/02/2016      Reactions   Aspirin Nausea Only   Stomach burning   Furosemide Palpitations   Latex Itching, Rash, Other (See Comments)   SNEEZING   Meloxicam Nausea Only   Nsaids Nausea Only   Tape Rash      Medication List       Accurate as of 06/01/16  2:28 PM. Always use your most recent med list.          calcium carbonate 600 MG Tabs tablet Commonly known as:  CALCIUM 600 Take 1 tablet (600 mg total) by mouth 2 (two) times daily with a meal.   dexlansoprazole 60 MG capsule Commonly known as:  DEXILANT Take 60 mg by mouth daily.   diphenhydrAMINE 25 mg capsule Commonly known as:  BENADRYL Take 25 mg by mouth as needed.   enalapril 20 MG tablet Commonly known as:  VASOTEC Take 20 mg by mouth every morning.   exemestane 25 MG tablet Commonly known as:  AROMASIN TAKE 1 TABLET BY MOUTH ONCE DAILY AFTER BREAKFAST   FIFTY50 PEN NEEDLES 31G X 8 MM Misc Generic drug:  Insulin Pen Needle Use once daily. Will use with victoza   gabapentin 300 MG capsule Commonly known as:  NEURONTIN Take 300 mg by mouth 3 (three) times daily.   glimepiride 2 MG tablet Commonly known as:  AMARYL Take 2 mg by mouth daily with breakfast.   guaiFENesin 600 MG 12 hr  tablet Commonly known as:  MUCINEX Take by mouth 2 (two) times daily as needed.   hydrochlorothiazide 25 MG tablet Commonly known as:  HYDRODIURIL Take by mouth.   HYDROcodone-acetaminophen 5-325 MG tablet Commonly known as:  NORCO/VICODIN Take 1-2 tablets by mouth every 6 (six) hours as needed.   KLOR-CON  M10 10 MEQ tablet Generic drug:  potassium chloride TAKE ONE TABLET BY MOUTH THREE TIMES DAILY   levothyroxine 75 MCG tablet Commonly known as:  SYNTHROID, LEVOTHROID Take 75 mcg by mouth daily before breakfast.   linaclotide 290 MCG Caps capsule Commonly known as:  LINZESS Take 290 mcg by mouth daily before breakfast.   liraglutide 18 MG/3ML Sopn Inject 0.6 mg into the skin daily.   magnesium oxide 400 MG tablet Commonly known as:  MAG-OX TAKE ONE TABLET BY MOUTH TWICE DAILY   metoCLOPramide 5 MG tablet Commonly known as:  REGLAN Take 5 mg by mouth 4 (four) times daily.   metolazone 2.5 MG tablet Commonly known as:  ZAROXOLYN Take by mouth.   metoprolol 50 MG tablet Commonly known as:  LOPRESSOR Take 50 mg by mouth 2 (two) times daily.   potassium chloride 10 MEQ tablet Commonly known as:  K-DUR Take 10 mEq by mouth 4 (four) times daily.   RABEprazole 20 MG tablet Commonly known as:  ACIPHEX Take by mouth.   ranitidine 150 MG tablet Commonly known as:  ZANTAC Take by mouth.   simvastatin 80 MG tablet Commonly known as:  ZOCOR Take 80 mg by mouth daily at 6 PM.   tiotropium 18 MCG inhalation capsule Commonly known as:  SPIRIVA Place 18 mcg into inhaler and inhale daily.   torsemide 20 MG tablet Commonly known as:  DEMADEX Take by mouth.   VENTOLIN HFA 108 (90 Base) MCG/ACT inhaler Generic drug:  albuterol Inhale 2 puffs into the lungs 4 (four) times daily.   Vitamin D3 2000 units capsule Take by mouth.       Allergies:  Allergies  Allergen Reactions  . Aspirin Nausea Only    Stomach burning  . Furosemide Palpitations  . Latex  Itching, Rash and Other (See Comments)    SNEEZING  . Meloxicam Nausea Only  . Nsaids Nausea Only  . Tape Rash    Family History: Family History  Problem Relation Age of Onset  . Breast cancer Paternal Aunt     84's  . Breast cancer Maternal Aunt 68  . Breast cancer Maternal Aunt 67  . Colon cancer Mother   . Lung cancer Father   . Prostate cancer Neg Hx   . Kidney cancer Neg Hx   . Bladder Cancer Neg Hx     Social History:  reports that she has quit smoking. Her smoking use included E-cigarettes. She has never used smokeless tobacco. She reports that she drinks alcohol. She reports that she does not use drugs.  ROS:                                         Physical Exam: There were no vitals taken for this visit.  Constitutional: Well nourished. Alert and oriented, No acute distress. HEENT: [place] AT, moist mucus membranes. Trachea midline, no masses. Cardiovascular: No clubbing, cyanosis, or edema. Respiratory: Normal respiratory effort, no increased work of breathing. Skin: No rashes, bruises or suspicious lesions. Lymph: No cervical or inguinal adenopathy. Neurologic: Grossly intact, no focal deficits, moving all 4 extremities. Psychiatric: Normal mood and affect.  Laboratory Data: Lab Results  Component Value Date   WBC 9.5 12/19/2015   HGB 12.9 12/19/2015   HCT 39.3 12/19/2015   MCV 78.1 (L) 12/19/2015   PLT 259 12/19/2015    Lab Results  Component Value Date  CREATININE 1.28 (H) 02/06/2016    Lab Results  Component Value Date   HGBA1C 8.0 (H) 10/23/2012    Lab Results  Component Value Date   AST 28 06/26/2015   Lab Results  Component Value Date   ALT 24 06/26/2015    PTNS treatment: The needle electrode was inserted into the lower, inner aspect of the patient's *** leg. The surface electrode was placed on the inside arch of the foot on the treatment leg. The lead set was connected to the stimulator and the needle  electrode clip was connected to the needle electrode. The stimulator that produces an adjustable electrical pulse that travels to the sacral nerve plexus via the tibial nerve was increased to *** until the patient received a ***.     Assessment & Plan:   1. Incontinence  - Treatment Plan:  The needle electrode was removed without difficulty to the patient.  Patient tolerated the procedure for 30 minutes.  She will return next week for # 5 out of 12 of their weekly PTNS treatment's  No Follow-up on file.  These notes generated with voice recognition software. I apologize for typographical errors.  Zara Council, [facility] [address], [place], [place] [address] [phone]

## 2016-06-02 ENCOUNTER — Ambulatory Visit: Payer: Medicare PPO | Admitting: Urology

## 2016-06-08 NOTE — Progress Notes (Signed)
Chief Complaint:  Chief Complaint  Patient presents with  . PTNS    Urge incontinence     HPI: 66 yo AAF who presents today to start a 12 weekly course of PTNS treatments. #4/12 today.    Background history Patient is a 89 -year-old [place] American female who is referred to [place] by, Dr. Sabra Heck, for urinary incontinence who presents with her husband, [name].  Patient states that she has had urinary incontinence for the last three to four months.  Patient has incontinence with urgency.   She is experiencing  Several incontinent episodes during the day. She is experiencing 4 incontinent episodes during the night.  Her incontinence volume is large.   She is wearing greater than 10 pads/depends daily.  She is having associated urinary frequency, urgency, nocturia and intermittency.  She does not have a history of urinary tract infections, STI's or injury to the bladder.   She denies dysuria, gross hematuria, suprapubic pain, back pain, abdominal pain or flank pain.  She has not had any recent fevers, chills, nausea or vomiting.   She does not have a history of nephrolithiasis, GU surgery or GU trauma.  She is sexually active.  She has not noted incontinence with sexual intercourse.  She is post menopausal.   She admits to constipation.  She is not having pain with bladder filling.  She has not had any recent imaging studies.  She is drinking 64 oz of water daily.   She is drinking an occasional caffeinated beverages daily.  She is not drinking alcoholic beverages daily.    Her risk factors for incontinence are obesity, a family history of incontinence, age, caffeine, diabetes, depression, vaginal atrophy, pelvic surgery and nerve damage of the lumbar area.  She is taking antihistamines, decongestants, diuretics and antidepressants.    Her UA was unremarkable.  Her PVR was 26 mL.            Previous Therapy:     Behavioral Modification Techniques (discuss all modalities attempted with start/stop  dates)            Pelvic Floor Exercises - patient deferred           Biofeedback - patient deferred           Bladder Training - advise patient to void every two hours while awake           Fluid Management - good water intake           Dietary Restrictions - NONE       Patient did not want to try medications for her incontinence due to gut transit issues.   Contraindications present for PTNS      Pacemaker - NO      Implantable defibrillator - NO      History of abnormal bleeding - NO      History of neuropathies or nerve damage - NO  Discussed with patient possible complications of procedure, such as discomfort, bleeding at insertion/stimulation site, procedure consent signed  Patient goals: To reduce the amount of incontinence episodes.   Today, she is complaining of frequency, urgency, nocturia and incontinence.  Baseline symptoms consist of 10 daytime voids, 4-5 night time voids and 0 incontinence.  After her last treatment, she is experiencing 10-15 day time voids (worse), 5-10 night time voids (worse) and 5-10 incontinence episodes daily (worse).    PMH: Past Medical History:  Diagnosis Date  . Anxiety   . Arthritis   .  Asthma   . Back pain   . Breast cancer ([place]) 2016   right breast  . Cancer ([place])    breast  . CHF (congestive heart failure) ([place])   . Complication of anesthesia    heart issues 2004 during gb surgery had to be revived  . COPD (chronic obstructive pulmonary disease) ([place])   . Depression   . Diabetes mellitus without complication ([place])   . GERD (gastroesophageal reflux disease)   . Gout   . Headache   . Hypertension   . Hypothyroidism   . Obesity   . Pain    chronic back  . Shortness of breath dyspnea   . Sleep apnea    could not use cpap uses 1.5 l [place] at night    Surgical History: Past Surgical History:  Procedure Laterality Date  . abd pain    . ABDOMINAL HYSTERECTOMY    . ANKLE FRACTURE SURGERY    . BACK SURGERY     lumbar fusion    . BREAST BIOPSY Left 2004   negative  . CHOLECYSTECTOMY    . ESOPHAGOGASTRODUODENOSCOPY (EGD) WITH PROPOFOL N/A 02/06/2016   Procedure: ESOPHAGOGASTRODUODENOSCOPY (EGD) WITH PROPOFOL;  Surgeon: Lollie Sails, MD;  Location: [facility] ENDOSCOPY;  Service: Endoscopy;  Laterality: N/A;  . MASTECTOMY Right 2016  . OOPHORECTOMY     one ovary remaining  . PARTIAL MASTECTOMY WITH NEEDLE LOCALIZATION Right 09/20/2014   Procedure: PARTIAL MASTECTOMY WITH NEEDLE LOCALIZATION;  Surgeon: Leonie Green, MD;  Location: [facility] ORS;  Service: General;  Laterality: Right;  . SENTINEL NODE BIOPSY Right 09/20/2014   Procedure: SENTINEL NODE BIOPSY;  Surgeon: Leonie Green, MD;  Location: [facility] ORS;  Service: General;  Laterality: Right;    Home Medications:  Allergies as of 06/09/2016      Reactions   Aspirin Nausea Only   Stomach burning   Furosemide Palpitations   Latex Itching, Rash, Other (See Comments)   SNEEZING   Meloxicam Nausea Only   Nsaids Nausea Only   Tape Rash      Medication List       Accurate as of 06/09/16 11:59 PM. Always use your most recent med list.          calcium carbonate 600 MG Tabs tablet Commonly known as:  CALCIUM 600 Take 1 tablet (600 mg total) by mouth 2 (two) times daily with a meal.   dexlansoprazole 60 MG capsule Commonly known as:  DEXILANT Take 60 mg by mouth daily.   diphenhydrAMINE 25 mg capsule Commonly known as:  BENADRYL Take 25 mg by mouth as needed.   enalapril 20 MG tablet Commonly known as:  VASOTEC Take 20 mg by mouth every morning.   exemestane 25 MG tablet Commonly known as:  AROMASIN TAKE 1 TABLET BY MOUTH ONCE DAILY AFTER BREAKFAST   FIFTY50 PEN NEEDLES 31G X 8 MM Misc Generic drug:  Insulin Pen Needle Use once daily. Will use with victoza   gabapentin 300 MG capsule Commonly known as:  NEURONTIN Take 300 mg by mouth 3 (three) times daily.   glimepiride 2 MG tablet Commonly known as:  AMARYL Take 2 mg by mouth  daily with breakfast.   guaiFENesin 600 MG 12 hr tablet Commonly known as:  MUCINEX Take by mouth 2 (two) times daily as needed.   hydrochlorothiazide 25 MG tablet Commonly known as:  HYDRODIURIL Take by mouth.   HYDROcodone-acetaminophen 5-325 MG tablet Commonly known as:  NORCO/VICODIN Take 1-2 tablets by mouth  every 6 (six) hours as needed.   KLOR-CON M10 10 MEQ tablet Generic drug:  potassium chloride TAKE ONE TABLET BY MOUTH THREE TIMES DAILY   levothyroxine 75 MCG tablet Commonly known as:  SYNTHROID, LEVOTHROID Take 75 mcg by mouth daily before breakfast.   linaclotide 290 MCG Caps capsule Commonly known as:  LINZESS Take 290 mcg by mouth daily before breakfast.   liraglutide 18 MG/3ML Sopn Inject 0.6 mg into the skin daily.   magnesium oxide 400 MG tablet Commonly known as:  MAG-OX TAKE ONE TABLET BY MOUTH TWICE DAILY   metoCLOPramide 5 MG tablet Commonly known as:  REGLAN Take 5 mg by mouth 4 (four) times daily.   metolazone 2.5 MG tablet Commonly known as:  ZAROXOLYN Take by mouth.   metoprolol 50 MG tablet Commonly known as:  LOPRESSOR Take 50 mg by mouth 2 (two) times daily.   potassium chloride 10 MEQ tablet Commonly known as:  K-DUR Take 10 mEq by mouth 4 (four) times daily.   RABEprazole 20 MG tablet Commonly known as:  ACIPHEX Take by mouth.   ranitidine 150 MG tablet Commonly known as:  ZANTAC Take by mouth.   simvastatin 80 MG tablet Commonly known as:  ZOCOR Take 80 mg by mouth daily at 6 PM.   tiotropium 18 MCG inhalation capsule Commonly known as:  SPIRIVA Place 18 mcg into inhaler and inhale daily.   torsemide 20 MG tablet Commonly known as:  DEMADEX Take by mouth.   VENTOLIN HFA 108 (90 Base) MCG/ACT inhaler Generic drug:  albuterol Inhale 2 puffs into the lungs 4 (four) times daily.   Vitamin D3 2000 units capsule Take by mouth.       Allergies:  Allergies  Allergen Reactions  . Aspirin Nausea Only    Stomach  burning  . Furosemide Palpitations  . Latex Itching, Rash and Other (See Comments)    SNEEZING  . Meloxicam Nausea Only  . Nsaids Nausea Only  . Tape Rash    Family History: Family History  Problem Relation Age of Onset  . Breast cancer Paternal Aunt     13's  . Breast cancer Maternal Aunt 68  . Breast cancer Maternal Aunt 67  . Colon cancer Mother   . Lung cancer Father   . Prostate cancer Neg Hx   . Kidney cancer Neg Hx   . Bladder Cancer Neg Hx     Social History:  reports that she has quit smoking. Her smoking use included E-cigarettes. She has never used smokeless tobacco. She reports that she drinks alcohol. She reports that she does not use drugs.  ROS: UROLOGY Frequent Urination?: Yes Hard to postpone urination?: Yes Burning/pain with urination?: No Get up at night to urinate?: Yes Leakage of urine?: Yes Urine stream starts and stops?: No Trouble starting stream?: No Do you have to strain to urinate?: No Blood in urine?: No Urinary tract infection?: No Sexually transmitted disease?: No Injury to kidneys or bladder?: No Painful intercourse?: No Weak stream?: No Currently pregnant?: No Vaginal bleeding?: No Last menstrual period?: n/a  Gastrointestinal Nausea?: Yes Vomiting?: No Indigestion/heartburn?: Yes Diarrhea?: Yes Constipation?: Yes  Constitutional Fever: No Night sweats?: Yes Weight loss?: No Fatigue?: No  Skin Skin rash/lesions?: No Itching?: Yes  Eyes Blurred vision?: Yes Double vision?: No  Ears/Nose/Throat Sore throat?: No Sinus problems?: Yes  Hematologic/Lymphatic Swollen glands?: No Easy bruising?: No  Cardiovascular Leg swelling?: Yes Chest pain?: No  Respiratory Cough?: Yes Shortness of breath?: Yes  Endocrine Excessive thirst?: No  Musculoskeletal Back pain?: Yes Joint pain?: Yes  Neurological Headaches?: No Dizziness?: No  Psychologic Depression?: No Anxiety?: No   Physical Exam: BP (!) 150/79    Pulse 81   Ht 5\' 8"  (1.727 m)   Wt (!) 364 lb (165.1 kg)   BMI 55.35 kg/m   Constitutional: Well nourished. Alert and oriented, No acute distress. HEENT: [place] AT, moist mucus membranes. Trachea midline, no masses. Cardiovascular: No clubbing, cyanosis, or edema. Respiratory: Normal respiratory effort, no increased work of breathing. Skin: No rashes, bruises or suspicious lesions. Lymph: No cervical or inguinal adenopathy. Neurologic: Grossly intact, no focal deficits, moving all 4 extremities. Psychiatric: Normal mood and affect.  Laboratory Data: Lab Results  Component Value Date   WBC 9.5 12/19/2015   HGB 12.9 12/19/2015   HCT 39.3 12/19/2015   MCV 78.1 (L) 12/19/2015   PLT 259 12/19/2015    Lab Results  Component Value Date   CREATININE 1.28 (H) 02/06/2016    Lab Results  Component Value Date   HGBA1C 8.0 (H) 10/23/2012    Lab Results  Component Value Date   AST 28 06/26/2015   Lab Results  Component Value Date   ALT 24 06/26/2015    PTNS treatment: The needle electrode was inserted into the lower, inner aspect of the patient's left leg. The surface electrode was placed on the inside arch of the foot on the treatment leg. The lead set was connected to the stimulator and the needle electrode clip was connected to the needle electrode. The stimulator that produces an adjustable electrical pulse that travels to the sacral nerve plexus via the tibial nerve was increased to 10 until the patient received a both a toe flex and a sensory response.     Assessment & Plan:   1. Incontinence  - Treatment Plan:  The needle electrode was removed without difficulty to the patient.  Patient tolerated the procedure for 30 minutes.  She will return next week for # 5 out of 12 of their weekly PTNS treatment's  Return in about 1 week (around 06/16/2016) for # 5 PTNS.  These notes generated with voice recognition software. I apologize for typographical errors.  Zara Council, [facility] [address], [place], [place] [address] [phone]

## 2016-06-09 ENCOUNTER — Encounter: Payer: Self-pay | Admitting: Urology

## 2016-06-09 ENCOUNTER — Ambulatory Visit (INDEPENDENT_AMBULATORY_CARE_PROVIDER_SITE_OTHER): Payer: Medicare PPO | Admitting: Urology

## 2016-06-09 VITALS — BP 150/79 | HR 81 | Ht 68.0 in | Wt 364.0 lb

## 2016-06-09 DIAGNOSIS — N3941 Urge incontinence: Secondary | ICD-10-CM | POA: Diagnosis not present

## 2016-06-09 LAB — PTNS-PERCUTANEOUS TIBIAL NERVE STIMULATION: SCAN RESULT: 10

## 2016-06-09 NOTE — Progress Notes (Signed)
PTNS  Session # 5  Health & Social Factors: No Change Caffeine: 3 per week Alcohol: 0 Daytime voids #per day: 10-15 Night-time voids #per night: 5-10 Urgency: Strong Incontinence Episodes #per day: 5-10 Ankle used: Left Treatment Setting: 10 Feeling/ Response: Both  Preformed By: Zara Council PA-c  Assistant: Lyndee Hensen CMA  Follow Up: One week

## 2016-06-15 NOTE — Progress Notes (Signed)
Chief Complaint:  Chief Complaint  Patient presents with  . PTNS    urge incontinence     HPI: 66 yo AAF who presents today to start a 12 weekly course of PTNS treatments. #5/12 today.    Background history Patient is a 73 -year-old [place] American female who is referred to [place] by, Dr. Sabra Heck, for urinary incontinence who presents with her husband, [name].  Patient states that she has had urinary incontinence for the last three to four months.  Patient has incontinence with urgency.   She is experiencing  Several incontinent episodes during the day. She is experiencing 4 incontinent episodes during the night.  Her incontinence volume is large.   She is wearing greater than 10 pads/depends daily.  She is having associated urinary frequency, urgency, nocturia and intermittency.  She does not have a history of urinary tract infections, STI's or injury to the bladder.   She denies dysuria, gross hematuria, suprapubic pain, back pain, abdominal pain or flank pain.  She has not had any recent fevers, chills, nausea or vomiting.   She does not have a history of nephrolithiasis, GU surgery or GU trauma.  She is sexually active.  She has not noted incontinence with sexual intercourse.  She is post menopausal.   She admits to constipation.  She is not having pain with bladder filling.  She has not had any recent imaging studies.  She is drinking 64 oz of water daily.   She is drinking an occasional caffeinated beverages daily.  She is not drinking alcoholic beverages daily.    Her risk factors for incontinence are obesity, a family history of incontinence, age, caffeine, diabetes, depression, vaginal atrophy, pelvic surgery and nerve damage of the lumbar area.  She is taking antihistamines, decongestants, diuretics and antidepressants.    Her UA was unremarkable.  Her PVR was 26 mL.            Previous Therapy:     Behavioral Modification Techniques (discuss all modalities attempted with start/stop  dates)            Pelvic Floor Exercises - patient deferred           Biofeedback - patient deferred           Bladder Training - advise patient to void every two hours while awake           Fluid Management - good water intake           Dietary Restrictions - NONE       Patient did not want to try medications for her incontinence due to gut transit issues.   Contraindications present for PTNS      Pacemaker - NO      Implantable defibrillator - NO      History of abnormal bleeding - NO      History of neuropathies or nerve damage - NO  Discussed with patient possible complications of procedure, such as discomfort, bleeding at insertion/stimulation site, procedure consent signed  Patient goals: To reduce the amount of incontinence episodes.   Today, she is complaining of frequency, urgency, nocturia and incontinence.  She is not experiencing dysuria, gross hematuria or suprapubic pain. She denies any fevers, chills, nausea or vomiting.  Baseline symptoms consist of 10 daytime voids, 4-5 night time voids and 0 incontinence.  After her last treatment, she is experiencing 10 day time voids (stable), 5 night time voids (stable) and 4 incontinence episodes daily (worse).  PMH: Past Medical History:  Diagnosis Date  . Anxiety   . Arthritis   . Asthma   . Back pain   . Breast cancer ([place]) 2016   right breast  . Cancer ([place])    breast  . CHF (congestive heart failure) ([place])   . Complication of anesthesia    heart issues 2004 during gb surgery had to be revived  . COPD (chronic obstructive pulmonary disease) ([place])   . Depression   . Diabetes mellitus without complication ([place])   . GERD (gastroesophageal reflux disease)   . Gout   . Headache   . Hypertension   . Hypothyroidism   . Obesity   . Pain    chronic back  . Shortness of breath dyspnea   . Sleep apnea    could not use cpap uses 1.5 l [place] at night    Surgical History: Past Surgical History:  Procedure Laterality  Date  . abd pain    . ABDOMINAL HYSTERECTOMY    . ANKLE FRACTURE SURGERY    . BACK SURGERY     lumbar fusion  . BREAST BIOPSY Left 2004   negative  . CHOLECYSTECTOMY    . ESOPHAGOGASTRODUODENOSCOPY (EGD) WITH PROPOFOL N/A 02/06/2016   Procedure: ESOPHAGOGASTRODUODENOSCOPY (EGD) WITH PROPOFOL;  Surgeon: Lollie Sails, MD;  Location: [facility] ENDOSCOPY;  Service: Endoscopy;  Laterality: N/A;  . MASTECTOMY Right 2016  . OOPHORECTOMY     one ovary remaining  . PARTIAL MASTECTOMY WITH NEEDLE LOCALIZATION Right 09/20/2014   Procedure: PARTIAL MASTECTOMY WITH NEEDLE LOCALIZATION;  Surgeon: Leonie Green, MD;  Location: [facility] ORS;  Service: General;  Laterality: Right;  . SENTINEL NODE BIOPSY Right 09/20/2014   Procedure: SENTINEL NODE BIOPSY;  Surgeon: Leonie Green, MD;  Location: [facility] ORS;  Service: General;  Laterality: Right;    Home Medications:  Allergies as of 06/16/2016      Reactions   Aspirin Nausea Only   Stomach burning   Furosemide Palpitations   Latex Itching, Rash, Other (See Comments)   SNEEZING   Meloxicam Nausea Only   Nsaids Nausea Only   Tape Rash      Medication List       Accurate as of 06/16/16  4:22 PM. Always use your most recent med list.          calcium carbonate 600 MG Tabs tablet Commonly known as:  CALCIUM 600 Take 1 tablet (600 mg total) by mouth 2 (two) times daily with a meal.   dexlansoprazole 60 MG capsule Commonly known as:  DEXILANT Take 60 mg by mouth daily.   diphenhydrAMINE 25 mg capsule Commonly known as:  BENADRYL Take 25 mg by mouth as needed.   enalapril 20 MG tablet Commonly known as:  VASOTEC Take 20 mg by mouth every morning.   exemestane 25 MG tablet Commonly known as:  AROMASIN TAKE 1 TABLET BY MOUTH ONCE DAILY AFTER BREAKFAST   FIFTY50 PEN NEEDLES 31G X 8 MM Misc Generic drug:  Insulin Pen Needle Use once daily. Will use with victoza   gabapentin 300 MG capsule Commonly known as:  NEURONTIN Take 300  mg by mouth 3 (three) times daily.   glimepiride 2 MG tablet Commonly known as:  AMARYL Take 2 mg by mouth daily with breakfast.   guaiFENesin 600 MG 12 hr tablet Commonly known as:  MUCINEX Take by mouth 2 (two) times daily as needed.   hydrochlorothiazide 25 MG tablet Commonly known as:  HYDRODIURIL Take by  mouth.   HYDROcodone-acetaminophen 5-325 MG tablet Commonly known as:  NORCO/VICODIN Take 1-2 tablets by mouth every 6 (six) hours as needed.   KLOR-CON M10 10 MEQ tablet Generic drug:  potassium chloride TAKE ONE TABLET BY MOUTH THREE TIMES DAILY   levothyroxine 75 MCG tablet Commonly known as:  SYNTHROID, LEVOTHROID Take 75 mcg by mouth daily before breakfast.   linaclotide 290 MCG Caps capsule Commonly known as:  LINZESS Take 290 mcg by mouth daily before breakfast.   liraglutide 18 MG/3ML Sopn Inject 0.6 mg into the skin daily.   magnesium oxide 400 MG tablet Commonly known as:  MAG-OX TAKE ONE TABLET BY MOUTH TWICE DAILY   metoCLOPramide 5 MG tablet Commonly known as:  REGLAN Take 5 mg by mouth 4 (four) times daily.   metolazone 2.5 MG tablet Commonly known as:  ZAROXOLYN Take by mouth.   metoprolol 50 MG tablet Commonly known as:  LOPRESSOR Take 50 mg by mouth 2 (two) times daily.   potassium chloride 10 MEQ tablet Commonly known as:  K-DUR Take 10 mEq by mouth 4 (four) times daily.   RABEprazole 20 MG tablet Commonly known as:  ACIPHEX Take by mouth.   ranitidine 150 MG tablet Commonly known as:  ZANTAC Take by mouth.   simvastatin 80 MG tablet Commonly known as:  ZOCOR Take 80 mg by mouth daily at 6 PM.   tiotropium 18 MCG inhalation capsule Commonly known as:  SPIRIVA Place 18 mcg into inhaler and inhale daily.   torsemide 20 MG tablet Commonly known as:  DEMADEX Take by mouth.   VENTOLIN HFA 108 (90 Base) MCG/ACT inhaler Generic drug:  albuterol Inhale 2 puffs into the lungs 4 (four) times daily.   Vitamin D3 2000 units  capsule Take by mouth.       Allergies:  Allergies  Allergen Reactions  . Aspirin Nausea Only    Stomach burning  . Furosemide Palpitations  . Latex Itching, Rash and Other (See Comments)    SNEEZING  . Meloxicam Nausea Only  . Nsaids Nausea Only  . Tape Rash    Family History: Family History  Problem Relation Age of Onset  . Breast cancer Paternal Aunt     78's  . Breast cancer Maternal Aunt 68  . Breast cancer Maternal Aunt 67  . Colon cancer Mother   . Lung cancer Father   . Prostate cancer Neg Hx   . Kidney cancer Neg Hx   . Bladder Cancer Neg Hx     Social History:  reports that she has quit smoking. Her smoking use included E-cigarettes. She has never used smokeless tobacco. She reports that she drinks alcohol. She reports that she does not use drugs.  ROS: UROLOGY Frequent Urination?: Yes Hard to postpone urination?: Yes Burning/pain with urination?: No Get up at night to urinate?: Yes Leakage of urine?: No Urine stream starts and stops?: No Trouble starting stream?: No Do you have to strain to urinate?: No Blood in urine?: No Urinary tract infection?: No Sexually transmitted disease?: No Injury to kidneys or bladder?: No Painful intercourse?: No Weak stream?: No Currently pregnant?: No Vaginal bleeding?: No Last menstrual period?: n  Gastrointestinal Nausea?: Yes Vomiting?: No Indigestion/heartburn?: Yes Diarrhea?: Yes Constipation?: Yes  Constitutional Fever: No Night sweats?: Yes Weight loss?: No Fatigue?: No  Skin Skin rash/lesions?: No Itching?: Yes  Eyes Blurred vision?: Yes Double vision?: No  Ears/Nose/Throat Sore throat?: No Sinus problems?: Yes  Hematologic/Lymphatic Swollen glands?: No Easy bruising?: No  Cardiovascular Leg swelling?: Yes Chest pain?: No  Respiratory Cough?: Yes Shortness of breath?: No  Endocrine Excessive thirst?: No  Musculoskeletal Back pain?: Yes Joint pain?:  Yes  Neurological Headaches?: Yes Dizziness?: No  Psychologic Depression?: No Anxiety?: No   Physical Exam: BP 136/76   Pulse 85   Ht 5\' 8"  (1.727 m)   Constitutional: Well nourished. Alert and oriented, No acute distress. HEENT: [place] AT, moist mucus membranes. Trachea midline, no masses. Cardiovascular: No clubbing, cyanosis, or edema. Respiratory: Normal respiratory effort, no increased work of breathing. Skin: No rashes, bruises or suspicious lesions. Lymph: No cervical or inguinal adenopathy. Neurologic: Grossly intact, no focal deficits, moving all 4 extremities. Psychiatric: Normal mood and affect.  Laboratory Data: Lab Results  Component Value Date   WBC 9.5 12/19/2015   HGB 12.9 12/19/2015   HCT 39.3 12/19/2015   MCV 78.1 (L) 12/19/2015   PLT 259 12/19/2015    Lab Results  Component Value Date   CREATININE 1.28 (H) 02/06/2016    Lab Results  Component Value Date   HGBA1C 8.0 (H) 10/23/2012    Lab Results  Component Value Date   AST 28 06/26/2015   Lab Results  Component Value Date   ALT 24 06/26/2015    PTNS treatment: The needle electrode was inserted into the lower, inner aspect of the patient's left leg. The surface electrode was placed on the inside arch of the foot on the treatment leg. The lead set was connected to the stimulator and the needle electrode clip was connected to the needle electrode. The stimulator that produces an adjustable electrical pulse that travels to the sacral nerve plexus via the tibial nerve was increased to 14 until the patient received a both a toe flex and a sensory response.     Assessment & Plan:   1. Incontinence  - Treatment Plan:  The needle electrode was removed without difficulty to the patient.  Patient tolerated the procedure for 30 minutes.  She will return next week for # 6 out of 12 of their weekly PTNS treatment's  Return in about 1 week (around 06/23/2016) for # 6 PTNS.  These notes generated with  voice recognition software. I apologize for typographical errors.  Zara Council, [facility] [address] [place], [place] [address] [phone]

## 2016-06-16 ENCOUNTER — Ambulatory Visit: Payer: Medicare PPO | Admitting: Urology

## 2016-06-16 ENCOUNTER — Encounter: Payer: Self-pay | Admitting: Urology

## 2016-06-16 VITALS — BP 136/76 | HR 85 | Ht 68.0 in

## 2016-06-16 DIAGNOSIS — N3941 Urge incontinence: Secondary | ICD-10-CM

## 2016-06-16 NOTE — Progress Notes (Signed)
PTNS  Session # 6  Health & Social Factors: No Change Caffeine: 3 Alcohol: 0 Daytime voids #per day: 10 Night-time voids #per night: 5 Urgency: Strong Incontinence Episodes #per day: 4 Ankle used: Left Treatment Setting: 14 Feeling/ Response: Both  Preformed By: Zara Council PA-C  Assistant: Lyndee Hensen CMA  Follow Up: One week

## 2016-06-22 NOTE — Progress Notes (Signed)
Chief Complaint:  Chief Complaint  Patient presents with  . Urinary Incontinence     HPI: 66 yo AAF who presents today to start a 12 weekly course of PTNS treatments. #6/12 today.    Background history Patient is a 43 -year-old [place] American female who is referred to [place] by, Dr. Sabra Heck, for urinary incontinence who presents with her husband, [name].  Patient states that she has had urinary incontinence for the last three to four months.  Patient has incontinence with urgency.   She is experiencing  Several incontinent episodes during the day. She is experiencing 4 incontinent episodes during the night.  Her incontinence volume is large.   She is wearing greater than 10 pads/depends daily.  She is having associated urinary frequency, urgency, nocturia and intermittency.  She does not have a history of urinary tract infections, STI's or injury to the bladder.   She denies dysuria, gross hematuria, suprapubic pain, back pain, abdominal pain or flank pain.  She has not had any recent fevers, chills, nausea or vomiting.   She does not have a history of nephrolithiasis, GU surgery or GU trauma.  She is sexually active.  She has not noted incontinence with sexual intercourse.  She is post menopausal.   She admits to constipation.  She is not having pain with bladder filling.  She has not had any recent imaging studies.  She is drinking 64 oz of water daily.   She is drinking an occasional caffeinated beverages daily.  She is not drinking alcoholic beverages daily.    Her risk factors for incontinence are obesity, a family history of incontinence, age, caffeine, diabetes, depression, vaginal atrophy, pelvic surgery and nerve damage of the lumbar area.  She is taking antihistamines, decongestants, diuretics and antidepressants.    Her UA was unremarkable.  Her PVR was 26 mL.            Previous Therapy:     Behavioral Modification Techniques (discuss all modalities attempted with start/stop  dates)            Pelvic Floor Exercises - patient deferred           Biofeedback - patient deferred           Bladder Training - advise patient to void every two hours while awake           Fluid Management - good water intake           Dietary Restrictions - NONE       Patient did not want to try medications for her incontinence due to gut transit issues.   Contraindications present for PTNS      Pacemaker - NO      Implantable defibrillator - NO      History of abnormal bleeding - NO      History of neuropathies or nerve damage - NO  Discussed with patient possible complications of procedure, such as discomfort, bleeding at insertion/stimulation site, procedure consent signed  Patient goals: To reduce the amount of incontinence episodes.   Today, she is complaining of frequency, urgency, nocturia and incontinence.  She is not experiencing dysuria, gross hematuria or suprapubic pain. She denies any fevers, chills, nausea or vomiting.  Baseline symptoms consist of 10 daytime voids, 4-5 night time voids and 0 incontinence.  After her last treatment, she is experiencing 9 day time voids (improved), 4 night time voids (stable) and 7 incontinence episodes daily (worse).    PMH:  Past Medical History:  Diagnosis Date  . Anxiety   . Arthritis   . Asthma   . Back pain   . Breast cancer ([place]) 2016   right breast  . Cancer ([place])    breast  . CHF (congestive heart failure) ([place])   . Complication of anesthesia    heart issues 2004 during gb surgery had to be revived  . COPD (chronic obstructive pulmonary disease) ([place])   . Depression   . Diabetes mellitus without complication ([place])   . GERD (gastroesophageal reflux disease)   . Gout   . Headache   . Hypertension   . Hypothyroidism   . Obesity   . Pain    chronic back  . Shortness of breath dyspnea   . Sleep apnea    could not use cpap uses 1.5 l [place] at night    Surgical History: Past Surgical History:  Procedure Laterality  Date  . abd pain    . ABDOMINAL HYSTERECTOMY    . ANKLE FRACTURE SURGERY    . BACK SURGERY     lumbar fusion  . BREAST BIOPSY Left 2004   negative  . CHOLECYSTECTOMY    . ESOPHAGOGASTRODUODENOSCOPY (EGD) WITH PROPOFOL N/A 02/06/2016   Procedure: ESOPHAGOGASTRODUODENOSCOPY (EGD) WITH PROPOFOL;  Surgeon: Lollie Sails, MD;  Location: [facility] ENDOSCOPY;  Service: Endoscopy;  Laterality: N/A;  . MASTECTOMY Right 2016  . OOPHORECTOMY     one ovary remaining  . PARTIAL MASTECTOMY WITH NEEDLE LOCALIZATION Right 09/20/2014   Procedure: PARTIAL MASTECTOMY WITH NEEDLE LOCALIZATION;  Surgeon: Leonie Green, MD;  Location: [facility] ORS;  Service: General;  Laterality: Right;  . SENTINEL NODE BIOPSY Right 09/20/2014   Procedure: SENTINEL NODE BIOPSY;  Surgeon: Leonie Green, MD;  Location: [facility] ORS;  Service: General;  Laterality: Right;    Home Medications:  Allergies as of 06/23/2016      Reactions   Aspirin Nausea Only   Stomach burning   Furosemide Palpitations   Latex Itching, Rash, Other (See Comments)   SNEEZING   Meloxicam Nausea Only   Nsaids Nausea Only   Tape Rash      Medication List       Accurate as of 06/23/16  4:24 PM. Always use your most recent med list.          calcium carbonate 600 MG Tabs tablet Commonly known as:  CALCIUM 600 Take 1 tablet (600 mg total) by mouth 2 (two) times daily with a meal.   dexlansoprazole 60 MG capsule Commonly known as:  DEXILANT Take 60 mg by mouth daily.   diphenhydrAMINE 25 mg capsule Commonly known as:  BENADRYL Take 25 mg by mouth as needed.   enalapril 20 MG tablet Commonly known as:  VASOTEC Take 20 mg by mouth every morning.   exemestane 25 MG tablet Commonly known as:  AROMASIN TAKE 1 TABLET BY MOUTH ONCE DAILY AFTER BREAKFAST   FIFTY50 PEN NEEDLES 31G X 8 MM Misc Generic drug:  Insulin Pen Needle Use once daily. Will use with victoza   gabapentin 300 MG capsule Commonly known as:  NEURONTIN Take 300  mg by mouth 3 (three) times daily.   glimepiride 2 MG tablet Commonly known as:  AMARYL Take 2 mg by mouth daily with breakfast.   guaiFENesin 600 MG 12 hr tablet Commonly known as:  MUCINEX Take by mouth 2 (two) times daily as needed.   hydrochlorothiazide 25 MG tablet Commonly known as:  HYDRODIURIL Take by mouth.  HYDROcodone-acetaminophen 5-325 MG tablet Commonly known as:  NORCO/VICODIN Take 1-2 tablets by mouth every 6 (six) hours as needed.   KLOR-CON M10 10 MEQ tablet Generic drug:  potassium chloride TAKE ONE TABLET BY MOUTH THREE TIMES DAILY   levothyroxine 75 MCG tablet Commonly known as:  SYNTHROID, LEVOTHROID Take 75 mcg by mouth daily before breakfast.   linaclotide 290 MCG Caps capsule Commonly known as:  LINZESS Take 290 mcg by mouth daily before breakfast.   liraglutide 18 MG/3ML Sopn Inject 0.6 mg into the skin daily.   magnesium oxide 400 MG tablet Commonly known as:  MAG-OX TAKE ONE TABLET BY MOUTH TWICE DAILY   metoCLOPramide 5 MG tablet Commonly known as:  REGLAN Take 5 mg by mouth 4 (four) times daily.   metolazone 2.5 MG tablet Commonly known as:  ZAROXOLYN Take by mouth.   metoprolol 50 MG tablet Commonly known as:  LOPRESSOR Take 50 mg by mouth 2 (two) times daily.   RABEprazole 20 MG tablet Commonly known as:  ACIPHEX Take by mouth.   ranitidine 150 MG tablet Commonly known as:  ZANTAC Take by mouth.   simvastatin 80 MG tablet Commonly known as:  ZOCOR Take 80 mg by mouth daily at 6 PM.   tiotropium 18 MCG inhalation capsule Commonly known as:  SPIRIVA Place 18 mcg into inhaler and inhale daily.   torsemide 20 MG tablet Commonly known as:  DEMADEX Take by mouth.   VENTOLIN HFA 108 (90 Base) MCG/ACT inhaler Generic drug:  albuterol Inhale 2 puffs into the lungs 4 (four) times daily.   Vitamin D3 2000 units capsule Take by mouth.       Allergies:  Allergies  Allergen Reactions  . Aspirin Nausea Only     Stomach burning  . Furosemide Palpitations  . Latex Itching, Rash and Other (See Comments)    SNEEZING  . Meloxicam Nausea Only  . Nsaids Nausea Only  . Tape Rash    Family History: Family History  Problem Relation Age of Onset  . Breast cancer Paternal Aunt     43's  . Breast cancer Maternal Aunt 68  . Breast cancer Maternal Aunt 67  . Colon cancer Mother   . Lung cancer Father   . Prostate cancer Neg Hx   . Kidney cancer Neg Hx   . Bladder Cancer Neg Hx     Social History:  reports that she has quit smoking. Her smoking use included E-cigarettes. She has never used smokeless tobacco. She reports that she drinks alcohol. She reports that she does not use drugs.  ROS: UROLOGY Frequent Urination?: Yes Hard to postpone urination?: No Burning/pain with urination?: No Get up at night to urinate?: Yes Leakage of urine?: Yes Urine stream starts and stops?: No Trouble starting stream?: No Do you have to strain to urinate?: No Blood in urine?: No Urinary tract infection?: No Sexually transmitted disease?: No Injury to kidneys or bladder?: No Painful intercourse?: No Weak stream?: No Currently pregnant?: No Vaginal bleeding?: No Last menstrual period?: n  Gastrointestinal Nausea?: Yes Vomiting?: No Indigestion/heartburn?: Yes Diarrhea?: Yes Constipation?: Yes  Constitutional Fever: No Night sweats?: Yes Weight loss?: No Fatigue?: No  Skin Skin rash/lesions?: No Itching?: Yes  Eyes Blurred vision?: Yes Double vision?: No  Ears/Nose/Throat Sore throat?: No Sinus problems?: Yes  Hematologic/Lymphatic Swollen glands?: No Easy bruising?: No  Cardiovascular Leg swelling?: Yes Chest pain?: No  Respiratory Cough?: Yes Shortness of breath?: No  Endocrine Excessive thirst?: No  Musculoskeletal Back  pain?: Yes Joint pain?: Yes  Neurological Headaches?: Yes Dizziness?: No  Psychologic Depression?: No Anxiety?: No   Physical Exam: BP 130/74  (BP Location: Left Arm, Patient Position: Sitting, Cuff Size: Large)   Pulse 78   Ht 5\' 8"  (1.727 m)   Wt (!) 361 lb 4.8 oz (163.9 kg)   BMI 54.94 kg/m   Constitutional: Well nourished. Alert and oriented, No acute distress. HEENT: [place] AT, moist mucus membranes. Trachea midline, no masses. Cardiovascular: No clubbing, cyanosis, or edema. Respiratory: Normal respiratory effort, no increased work of breathing. Skin: No rashes, bruises or suspicious lesions. Lymph: No cervical or inguinal adenopathy. Neurologic: Grossly intact, no focal deficits, moving all 4 extremities. Psychiatric: Normal mood and affect.  Laboratory Data: Lab Results  Component Value Date   WBC 9.5 12/19/2015   HGB 12.9 12/19/2015   HCT 39.3 12/19/2015   MCV 78.1 (L) 12/19/2015   PLT 259 12/19/2015    Lab Results  Component Value Date   CREATININE 1.28 (H) 02/06/2016    Lab Results  Component Value Date   HGBA1C 8.0 (H) 10/23/2012    Lab Results  Component Value Date   AST 28 06/26/2015   Lab Results  Component Value Date   ALT 24 06/26/2015    PTNS treatment: The needle electrode was inserted into the lower, inner aspect of the patient's left leg. The surface electrode was placed on the inside arch of the foot on the treatment leg. The lead set was connected to the stimulator and the needle electrode clip was connected to the needle electrode. The stimulator that produces an adjustable electrical pulse that travels to the sacral nerve plexus via the tibial nerve was increased to 9 until the patient received a both a toe flex.     Assessment & Plan:   1. Incontinence  - Treatment Plan:  The needle electrode was removed without difficulty to the patient.  Patient tolerated the procedure for 30 minutes.  She will return next week for # 7 out of 12 of their weekly PTNS treatment's  Return in about 1 week (around 06/30/2016) for # 7 PTNS.  These notes generated with voice recognition software. I  apologize for typographical errors.  Zara Council, [facility] [address] [place], [place] [address] [phone]

## 2016-06-23 ENCOUNTER — Ambulatory Visit (INDEPENDENT_AMBULATORY_CARE_PROVIDER_SITE_OTHER): Payer: Medicare PPO | Admitting: Urology

## 2016-06-23 ENCOUNTER — Encounter: Payer: Self-pay | Admitting: Urology

## 2016-06-23 VITALS — BP 130/74 | HR 78 | Ht 68.0 in | Wt 361.3 lb

## 2016-06-23 DIAGNOSIS — N3941 Urge incontinence: Secondary | ICD-10-CM

## 2016-06-29 NOTE — Progress Notes (Signed)
Chief Complaint:  Chief Complaint  Patient presents with  . PTNS    urge incotninence     HPI: 66 yo AAF who presents today to start a 12 weekly course of PTNS treatments. #7/12 today.    Background history Patient is a 49 -year-old [place] American female who is referred to [place] by, Dr. Sabra Heck, for urinary incontinence who presents with her husband, [name].  Patient states that she has had urinary incontinence for the last three to four months.  Patient has incontinence with urgency.   She is experiencing  Several incontinent episodes during the day. She is experiencing 4 incontinent episodes during the night.  Her incontinence volume is large.   She is wearing greater than 10 pads/depends daily.  She is having associated urinary frequency, urgency, nocturia and intermittency.  She does not have a history of urinary tract infections, STI's or injury to the bladder.   She denies dysuria, gross hematuria, suprapubic pain, back pain, abdominal pain or flank pain.  She has not had any recent fevers, chills, nausea or vomiting.   She does not have a history of nephrolithiasis, GU surgery or GU trauma.  She is sexually active.  She has not noted incontinence with sexual intercourse.  She is post menopausal.   She admits to constipation.  She is not having pain with bladder filling.  She has not had any recent imaging studies.  She is drinking 64 oz of water daily.   She is drinking an occasional caffeinated beverages daily.  She is not drinking alcoholic beverages daily.    Her risk factors for incontinence are obesity, a family history of incontinence, age, caffeine, diabetes, depression, vaginal atrophy, pelvic surgery and nerve damage of the lumbar area.  She is taking antihistamines, decongestants, diuretics and antidepressants.    Her UA was unremarkable.  Her PVR was 26 mL.            Previous Therapy:     Behavioral Modification Techniques (discuss all modalities attempted with start/stop  dates)            Pelvic Floor Exercises - patient deferred           Biofeedback - patient deferred           Bladder Training - advise patient to void every two hours while awake           Fluid Management - good water intake           Dietary Restrictions - NONE       Patient did not want to try medications for her incontinence due to gut transit issues.   Contraindications present for PTNS      Pacemaker - NO      Implantable defibrillator - NO      History of abnormal bleeding - NO      History of neuropathies or nerve damage - NO  Discussed with patient possible complications of procedure, such as discomfort, bleeding at insertion/stimulation site, procedure consent signed  Patient goals: To reduce the amount of incontinence episodes.   Today, she is complaining of frequency, urgency, nocturia and incontinence.  She is not experiencing dysuria, gross hematuria or suprapubic pain. She denies any fevers, chills, nausea or vomiting.  Baseline symptoms consist of 10 daytime voids, 4-5 night time voids and 0 incontinence.  After her last treatment, she is experiencing 9 day time voids (improved), 5 night time voids (stable) and 8 incontinence episodes daily (worse).  PMH: Past Medical History:  Diagnosis Date  . Anxiety   . Arthritis   . Asthma   . Back pain   . Breast cancer ([place]) 2016   right breast  . Cancer ([place])    breast  . CHF (congestive heart failure) ([place])   . Complication of anesthesia    heart issues 2004 during gb surgery had to be revived  . COPD (chronic obstructive pulmonary disease) ([place])   . Depression   . Diabetes mellitus without complication ([place])   . GERD (gastroesophageal reflux disease)   . Gout   . Headache   . Hypertension   . Hypothyroidism   . Obesity   . Pain    chronic back  . Shortness of breath dyspnea   . Sleep apnea    could not use cpap uses 1.5 l [place] at night    Surgical History: Past Surgical History:  Procedure Laterality  Date  . abd pain    . ABDOMINAL HYSTERECTOMY    . ANKLE FRACTURE SURGERY    . BACK SURGERY     lumbar fusion  . BREAST BIOPSY Left 2004   negative  . CHOLECYSTECTOMY    . ESOPHAGOGASTRODUODENOSCOPY (EGD) WITH PROPOFOL N/A 02/06/2016   Procedure: ESOPHAGOGASTRODUODENOSCOPY (EGD) WITH PROPOFOL;  Surgeon: Lollie Sails, MD;  Location[facility] ENDOSCOPY;  Service: Endoscopy;  Laterality: N/A;  . MASTECTOMY Right 2016  . OOPHORECTOMY     one ovary remaining  . PARTIAL MASTECTOMY WITH NEEDLE LOCALIZATION Right 09/20/2014   Procedure: PARTIAL MASTECTOMY WITH NEEDLE LOCALIZATION;  Surgeon: Leonie Green, MD;  Location: [facility] ORS;  Service: General;  Laterality: Right;  . SENTINEL NODE BIOPSY Right 09/20/2014   Procedure: SENTINEL NODE BIOPSY;  Surgeon: Leonie Green, MD;  Location: [facility] ORS;  Service: General;  Laterality: Right;    Home Medications:  Allergies as of 06/30/2016      Reactions   Aspirin Nausea Only   Stomach burning   Furosemide Palpitations   Latex Itching, Rash, Other (See Comments)   SNEEZING   Meloxicam Nausea Only   Nsaids Nausea Only   Tape Rash      Medication List       Accurate as of 06/30/16  4:04 PM. Always use your most recent med list.          calcium carbonate 600 MG Tabs tablet Commonly known as:  CALCIUM 600 Take 1 tablet (600 mg total) by mouth 2 (two) times daily with a meal.   dexlansoprazole 60 MG capsule Commonly known as:  DEXILANT Take 60 mg by mouth daily.   diphenhydrAMINE 25 mg capsule Commonly known as:  BENADRYL Take 25 mg by mouth as needed.   enalapril 20 MG tablet Commonly known as:  VASOTEC Take 20 mg by mouth every morning.   exemestane 25 MG tablet Commonly known as:  AROMASIN TAKE 1 TABLET BY MOUTH ONCE DAILY AFTER BREAKFAST   gabapentin 300 MG capsule Commonly known as:  NEURONTIN Take 300 mg by mouth 3 (three) times daily.   glimepiride 2 MG tablet Commonly known as:  AMARYL Take 2 mg by mouth daily  with breakfast.   guaiFENesin 600 MG 12 hr tablet Commonly known as:  MUCINEX Take by mouth 2 (two) times daily as needed.   hydrochlorothiazide 25 MG tablet Commonly known as:  HYDRODIURIL Take by mouth.   HYDROcodone-acetaminophen 5-325 MG tablet Commonly known as:  NORCO/VICODIN Take 1-2 tablets by mouth every 6 (six) hours as needed.  KLOR-CON M10 10 MEQ tablet Generic drug:  potassium chloride TAKE ONE TABLET BY MOUTH THREE TIMES DAILY   levothyroxine 75 MCG tablet Commonly known as:  SYNTHROID, LEVOTHROID Take 75 mcg by mouth daily before breakfast.   linaclotide 290 MCG Caps capsule Commonly known as:  LINZESS Take 290 mcg by mouth daily before breakfast.   liraglutide 18 MG/3ML Sopn Inject 0.6 mg into the skin daily.   magnesium oxide 400 MG tablet Commonly known as:  MAG-OX TAKE ONE TABLET BY MOUTH TWICE DAILY   metoCLOPramide 5 MG tablet Commonly known as:  REGLAN Take 5 mg by mouth 4 (four) times daily.   metolazone 2.5 MG tablet Commonly known as:  ZAROXOLYN Take by mouth.   metoprolol 50 MG tablet Commonly known as:  LOPRESSOR Take 50 mg by mouth 2 (two) times daily.   RABEprazole 20 MG tablet Commonly known as:  ACIPHEX Take by mouth.   ranitidine 150 MG tablet Commonly known as:  ZANTAC Take by mouth.   simvastatin 80 MG tablet Commonly known as:  ZOCOR Take 80 mg by mouth daily at 6 PM.   tiotropium 18 MCG inhalation capsule Commonly known as:  SPIRIVA Place 18 mcg into inhaler and inhale daily.   torsemide 20 MG tablet Commonly known as:  DEMADEX Take by mouth.   VENTOLIN HFA 108 (90 Base) MCG/ACT inhaler Generic drug:  albuterol Inhale 2 puffs into the lungs 4 (four) times daily.   Vitamin D3 2000 units capsule Take by mouth.       Allergies:  Allergies  Allergen Reactions  . Aspirin Nausea Only    Stomach burning  . Furosemide Palpitations  . Latex Itching, Rash and Other (See Comments)    SNEEZING  . Meloxicam  Nausea Only  . Nsaids Nausea Only  . Tape Rash    Family History: Family History  Problem Relation Age of Onset  . Breast cancer Paternal Aunt     53's  . Breast cancer Maternal Aunt 68  . Breast cancer Maternal Aunt 67  . Colon cancer Mother   . Lung cancer Father   . Prostate cancer Neg Hx   . Kidney cancer Neg Hx   . Bladder Cancer Neg Hx     Social History:  reports that she has quit smoking. Her smoking use included E-cigarettes. She has never used smokeless tobacco. She reports that she drinks alcohol. She reports that she does not use drugs.  ROS: UROLOGY Frequent Urination?: Yes Hard to postpone urination?: No Burning/pain with urination?: No Get up at night to urinate?: Yes Leakage of urine?: No Urine stream starts and stops?: No Trouble starting stream?: No Do you have to strain to urinate?: No Blood in urine?: No Urinary tract infection?: No Sexually transmitted disease?: No Injury to kidneys or bladder?: No Painful intercourse?: No Weak stream?: No Currently pregnant?: No Vaginal bleeding?: No Last menstrual period?: n  Gastrointestinal Nausea?: Yes Vomiting?: No Indigestion/heartburn?: Yes Diarrhea?: Yes Constipation?: Yes  Constitutional Fever: No Night sweats?: Yes Weight loss?: No Fatigue?: Yes  Skin Skin rash/lesions?: No Itching?: Yes  Eyes Blurred vision?: Yes Double vision?: No  Ears/Nose/Throat Sore throat?: No Sinus problems?: Yes  Hematologic/Lymphatic Swollen glands?: No Easy bruising?: No  Cardiovascular Leg swelling?: Yes Chest pain?: No  Respiratory Cough?: Yes Shortness of breath?: No  Endocrine Excessive thirst?: No  Musculoskeletal Back pain?: Yes Joint pain?: Yes  Neurological Headaches?: No Dizziness?: No  Psychologic Depression?: No Anxiety?: No   Physical Exam: BP  117/72   Pulse 80   Ht 5\' 8"  (1.727 m)   Wt (!) 356 lb 8 oz (161.7 kg)   BMI 54.21 kg/m   Constitutional: Well  nourished. Alert and oriented, No acute distress. HEENT: [place] AT, moist mucus membranes. Trachea midline, no masses. Cardiovascular: No clubbing, cyanosis, or edema. Respiratory: Normal respiratory effort, no increased work of breathing. Skin: No rashes, bruises or suspicious lesions. Lymph: No cervical or inguinal adenopathy. Neurologic: Grossly intact, no focal deficits, moving all 4 extremities. Psychiatric: Normal mood and affect.  Laboratory Data: Lab Results  Component Value Date   WBC 9.5 12/19/2015   HGB 12.9 12/19/2015   HCT 39.3 12/19/2015   MCV 78.1 (L) 12/19/2015   PLT 259 12/19/2015    Lab Results  Component Value Date   CREATININE 1.28 (H) 02/06/2016    Lab Results  Component Value Date   HGBA1C 8.0 (H) 10/23/2012    Lab Results  Component Value Date   AST 28 06/26/2015   Lab Results  Component Value Date   ALT 24 06/26/2015    PTNS treatment: The needle electrode was inserted into the lower, inner aspect of the patient's left leg. The surface electrode was placed on the inside arch of the foot on the treatment leg. The lead set was connected to the stimulator and the needle electrode clip was connected to the needle electrode. The stimulator that produces an adjustable electrical pulse that travels to the sacral nerve plexus via the tibial nerve was increased to 15 until the patient received a toe flex and sensory response.     Assessment & Plan:   1. Incontinence  - Treatment Plan:  The needle electrode was removed without difficulty to the patient.  Patient tolerated the procedure for 30 minutes.  She will return next week for # 8 out of 12 of their weekly PTNS treatment's  Return in about 1 week (around 07/07/2016) for # 9 PTNS.  These notes generated with voice recognition software. I apologize for typographical errors.  Zara Council, [facility] [address] [place], [place] [address] [phone]

## 2016-06-30 ENCOUNTER — Other Ambulatory Visit: Payer: Self-pay | Admitting: *Deleted

## 2016-06-30 ENCOUNTER — Encounter: Payer: Self-pay | Admitting: Urology

## 2016-06-30 ENCOUNTER — Ambulatory Visit (INDEPENDENT_AMBULATORY_CARE_PROVIDER_SITE_OTHER): Payer: Medicare PPO | Admitting: Urology

## 2016-06-30 VITALS — BP 117/72 | HR 80 | Ht 68.0 in | Wt 356.5 lb

## 2016-06-30 DIAGNOSIS — N3941 Urge incontinence: Secondary | ICD-10-CM

## 2016-06-30 DIAGNOSIS — Z853 Personal history of malignant neoplasm of breast: Secondary | ICD-10-CM

## 2016-06-30 LAB — PTNS-PERCUTANEOUS TIBIAL NERVE STIMULATION: Scan Result: 15

## 2016-06-30 NOTE — Progress Notes (Signed)
PTNS  Session # 7  Health & Social Factors: No Change  Caffeine: 1 Alcohol: 0 Daytime voids #per day: 9 Night-time voids #per night: 5 Urgency: Strong Incontinence Episodes #per day: 8 Ankle used: Left Treatment Setting: 15 Feeling/ Response: Both  Preformed By: Zara Council PA-C  Assistant: Lyndee Hensen CMA  Follow Up: One Week

## 2016-07-01 ENCOUNTER — Inpatient Hospital Stay: Payer: Medicare PPO | Attending: Hematology and Oncology

## 2016-07-01 ENCOUNTER — Inpatient Hospital Stay (HOSPITAL_BASED_OUTPATIENT_CLINIC_OR_DEPARTMENT_OTHER): Payer: Medicare PPO | Admitting: Hematology and Oncology

## 2016-07-01 VITALS — BP 148/77 | HR 76 | Temp 97.6°F | Resp 18 | Wt 347.4 lb

## 2016-07-01 DIAGNOSIS — K5900 Constipation, unspecified: Secondary | ICD-10-CM | POA: Diagnosis not present

## 2016-07-01 DIAGNOSIS — G4730 Sleep apnea, unspecified: Secondary | ICD-10-CM | POA: Diagnosis not present

## 2016-07-01 DIAGNOSIS — I509 Heart failure, unspecified: Secondary | ICD-10-CM

## 2016-07-01 DIAGNOSIS — E119 Type 2 diabetes mellitus without complications: Secondary | ICD-10-CM | POA: Insufficient documentation

## 2016-07-01 DIAGNOSIS — E039 Hypothyroidism, unspecified: Secondary | ICD-10-CM

## 2016-07-01 DIAGNOSIS — M129 Arthropathy, unspecified: Secondary | ICD-10-CM | POA: Diagnosis not present

## 2016-07-01 DIAGNOSIS — Z800 Family history of malignant neoplasm of digestive organs: Secondary | ICD-10-CM

## 2016-07-01 DIAGNOSIS — M7989 Other specified soft tissue disorders: Secondary | ICD-10-CM

## 2016-07-01 DIAGNOSIS — Z9011 Acquired absence of right breast and nipple: Secondary | ICD-10-CM | POA: Insufficient documentation

## 2016-07-01 DIAGNOSIS — Z853 Personal history of malignant neoplasm of breast: Secondary | ICD-10-CM

## 2016-07-01 DIAGNOSIS — Z79811 Long term (current) use of aromatase inhibitors: Secondary | ICD-10-CM | POA: Diagnosis not present

## 2016-07-01 DIAGNOSIS — Z801 Family history of malignant neoplasm of trachea, bronchus and lung: Secondary | ICD-10-CM | POA: Diagnosis not present

## 2016-07-01 DIAGNOSIS — E669 Obesity, unspecified: Secondary | ICD-10-CM | POA: Diagnosis not present

## 2016-07-01 DIAGNOSIS — C50511 Malignant neoplasm of lower-outer quadrant of right female breast: Secondary | ICD-10-CM

## 2016-07-01 DIAGNOSIS — Z7984 Long term (current) use of oral hypoglycemic drugs: Secondary | ICD-10-CM

## 2016-07-01 DIAGNOSIS — F419 Anxiety disorder, unspecified: Secondary | ICD-10-CM

## 2016-07-01 DIAGNOSIS — Z79899 Other long term (current) drug therapy: Secondary | ICD-10-CM | POA: Diagnosis not present

## 2016-07-01 DIAGNOSIS — M109 Gout, unspecified: Secondary | ICD-10-CM

## 2016-07-01 DIAGNOSIS — J449 Chronic obstructive pulmonary disease, unspecified: Secondary | ICD-10-CM | POA: Insufficient documentation

## 2016-07-01 DIAGNOSIS — Z170 Estrogen receptor positive status [ER+]: Secondary | ICD-10-CM | POA: Insufficient documentation

## 2016-07-01 DIAGNOSIS — Z803 Family history of malignant neoplasm of breast: Secondary | ICD-10-CM | POA: Insufficient documentation

## 2016-07-01 DIAGNOSIS — F329 Major depressive disorder, single episode, unspecified: Secondary | ICD-10-CM

## 2016-07-01 DIAGNOSIS — I10 Essential (primary) hypertension: Secondary | ICD-10-CM | POA: Insufficient documentation

## 2016-07-01 DIAGNOSIS — R718 Other abnormality of red blood cells: Secondary | ICD-10-CM

## 2016-07-01 DIAGNOSIS — Z923 Personal history of irradiation: Secondary | ICD-10-CM | POA: Diagnosis not present

## 2016-07-01 DIAGNOSIS — K219 Gastro-esophageal reflux disease without esophagitis: Secondary | ICD-10-CM | POA: Diagnosis not present

## 2016-07-01 LAB — CBC WITH DIFFERENTIAL/PLATELET
Basophils Absolute: 0 10*3/uL (ref 0–0.1)
Basophils Relative: 0 %
Eosinophils Absolute: 0.1 10*3/uL (ref 0–0.7)
Eosinophils Relative: 1 %
HCT: 37.6 % (ref 35.0–47.0)
Hemoglobin: 12.5 g/dL (ref 12.0–16.0)
Lymphocytes Relative: 21 %
Lymphs Abs: 1.8 10*3/uL (ref 1.0–3.6)
MCH: 26.1 pg (ref 26.0–34.0)
MCHC: 33.1 g/dL (ref 32.0–36.0)
MCV: 78.8 fL — ABNORMAL LOW (ref 80.0–100.0)
Monocytes Absolute: 0.5 10*3/uL (ref 0.2–0.9)
Monocytes Relative: 6 %
Neutro Abs: 6.4 10*3/uL (ref 1.4–6.5)
Neutrophils Relative %: 72 %
Platelets: 264 10*3/uL (ref 150–440)
RBC: 4.77 MIL/uL (ref 3.80–5.20)
RDW: 14.5 % (ref 11.5–14.5)
WBC: 8.9 10*3/uL (ref 3.6–11.0)

## 2016-07-01 LAB — IRON AND TIBC
Iron: 49 ug/dL (ref 28–170)
Saturation Ratios: 15 % (ref 10.4–31.8)
TIBC: 337 ug/dL (ref 250–450)
UIBC: 288 ug/dL

## 2016-07-01 LAB — COMPREHENSIVE METABOLIC PANEL
ALT: 23 U/L (ref 14–54)
AST: 28 U/L (ref 15–41)
Albumin: 4.1 g/dL (ref 3.5–5.0)
Alkaline Phosphatase: 63 U/L (ref 38–126)
Anion gap: 9 (ref 5–15)
BUN: 14 mg/dL (ref 6–20)
CO2: 29 mmol/L (ref 22–32)
Calcium: 9.7 mg/dL (ref 8.9–10.3)
Chloride: 98 mmol/L — ABNORMAL LOW (ref 101–111)
Creatinine, Ser: 1.18 mg/dL — ABNORMAL HIGH (ref 0.44–1.00)
GFR calc Af Amer: 56 mL/min — ABNORMAL LOW (ref >60)
GFR calc non Af Amer: 48 mL/min — ABNORMAL LOW (ref >60)
Glucose, Bld: 148 mg/dL — ABNORMAL HIGH (ref 65–99)
Potassium: 4.3 mmol/L (ref 3.5–5.1)
Sodium: 136 mmol/L (ref 135–145)
Total Bilirubin: 0.9 mg/dL (ref 0.3–1.2)
Total Protein: 8 g/dL (ref 6.5–8.1)

## 2016-07-01 LAB — FERRITIN: Ferritin: 133 ng/mL (ref 11–307)

## 2016-07-01 NOTE — Progress Notes (Signed)
Patient is seeing Dr. Larene Beach regarding problems with her bladder.  States she is currently going weekly for a "bladder reboot".  Due to this she is urinating frequently.  Patient states she is having pain in her back and hips 8/10.

## 2016-07-01 NOTE — Progress Notes (Signed)
[facility] day:  07/01/2016  Chief Complaint: Teresa Holland is a 66 y.o. female with stage I right breast cancer who is seen for reassessment.  HPI:  The patient initially presented with an abnormal mammogram in 2016.   Lower outer right breast biopsy on 08/29/2014 revealed invasive mammary carcinoma of no special type.  Tumor was ER+ (> 90%), PR+ (>90%), and Her2/neu 1+.  She underwent partial mastectomy on 09/20/2014 by Dr. Rochel Brome.  Pathology revealed a 0.9 cm grade 1 invasive mammary carcinoma. There was no lymphovascular invasion.  Initial caudal margin was positive with invasive carcinoma. Re-resection was negative. One sentinel lymph node was negative.  Pathologic stage was T1bN0.  She did not receive radiation secondary to her morbid conditions. She could not lay flat for radiation.  She started Femara in 10/2014. She switched to Aromasin secondary to poor tolerance of Femara.  She noted issues with swelling and constipation. She has continued Aromasin to date.  CA27.29 was 34.0 on 12/19/2015.  Bone density on 05/20/2011 was normal with a T-score of 0.4 in the left femur.  Bone scan on 01/01/2016 revealed multifocal degenerative changes without evidence of metastatic disease.  Bilateral diagnostic mammogram on 01/15/2016 revealed no evidence of malignancy in either breast. There were lumpectomy changes in the outer right breast.  Symptomatically, she notes a breast pain depending on the cold weather. She noticed she has not had any breast pain in a while. She is limited and can only walk short distances.   Past Medical History:  Diagnosis Date  . Anxiety   . Arthritis   . Asthma   . Back pain   . Breast cancer ([place]) 2016   right breast  . Cancer ([place])    breast  . CHF (congestive heart failure) ([place])   . Complication of anesthesia    heart issues 2004 during gb surgery had to be revived  . COPD (chronic obstructive  pulmonary disease) ([place])   . Depression   . Diabetes mellitus without complication ([place])   . GERD (gastroesophageal reflux disease)   . Gout   . Headache   . Hypertension   . Hypothyroidism   . Obesity   . Pain    chronic back  . Shortness of breath dyspnea   . Sleep apnea    could not use cpap uses 1.5 l [place] at night    Past Surgical History:  Procedure Laterality Date  . abd pain    . ABDOMINAL HYSTERECTOMY    . ANKLE FRACTURE SURGERY    . BACK SURGERY     lumbar fusion  . BREAST BIOPSY Left 2004   negative  . CHOLECYSTECTOMY    . ESOPHAGOGASTRODUODENOSCOPY (EGD) WITH PROPOFOL N/A 02/06/2016   Procedure: ESOPHAGOGASTRODUODENOSCOPY (EGD) WITH PROPOFOL;  Surgeon: Lollie Sails, MD;  Location: [facility] ENDOSCOPY;  Service: Endoscopy;  Laterality: N/A;  . MASTECTOMY Right 2016  . OOPHORECTOMY     one ovary remaining  . PARTIAL MASTECTOMY WITH NEEDLE LOCALIZATION Right 09/20/2014   Procedure: PARTIAL MASTECTOMY WITH NEEDLE LOCALIZATION;  Surgeon: Leonie Green, MD;  Location: [facility] ORS;  Service: General;  Laterality: Right;  . SENTINEL NODE BIOPSY Right 09/20/2014   Procedure: SENTINEL NODE BIOPSY;  Surgeon: Leonie Green, MD;  Location: [facility] ORS;  Service: General;  Laterality: Right;    Family History  Problem Relation Age of Onset  . Breast cancer Paternal Aunt     43's  .  Breast cancer Maternal Aunt 68  . Breast cancer Maternal Aunt 67  . Colon cancer Mother   . Lung cancer Father   . Prostate cancer Neg Hx   . Kidney cancer Neg Hx   . Bladder Cancer Neg Hx     Social History:  reports that she has quit smoking. Her smoking use included E-cigarettes. She has never used smokeless tobacco. She reports that she drinks alcohol. She reports that she does not use drugs.  She drinks alcohol rarely.  She is disabled secondary to her back.  She previously worked with developmentally disabled adults for 28 years.  She lives in [place].  The patient is accompanied  by her husband, [name], today.  Allergies:  Allergies  Allergen Reactions  . Aspirin Nausea Only    Stomach burning  . Furosemide Palpitations  . Latex Itching, Rash and Other (See Comments)    SNEEZING  . Meloxicam Nausea Only  . Nsaids Nausea Only  . Tape Rash    Current Medications: Current Outpatient Prescriptions  Medication Sig Dispense Refill  . albuterol (VENTOLIN HFA) 108 (90 BASE) MCG/ACT inhaler Inhale 2 puffs into the lungs 4 (four) times daily.     . calcium carbonate (CALCIUM 600) 600 MG TABS tablet Take 1 tablet (600 mg total) by mouth 2 (two) times daily with a meal. 60 tablet 6  . Cholecalciferol (VITAMIN D3) 2000 UNITS capsule Take by mouth.    . dexlansoprazole (DEXILANT) 60 MG capsule Take 60 mg by mouth daily.    . diphenhydrAMINE (BENADRYL) 25 mg capsule Take 25 mg by mouth as needed.     . enalapril (VASOTEC) 20 MG tablet Take 20 mg by mouth every morning.     Marland Kitchen exemestane (AROMASIN) 25 MG tablet TAKE 1 TABLET BY MOUTH ONCE DAILY AFTER BREAKFAST 30 tablet 0  . gabapentin (NEURONTIN) 300 MG capsule Take 300 mg by mouth 3 (three) times daily.    Marland Kitchen glimepiride (AMARYL) 2 MG tablet Take 2 mg by mouth daily with breakfast.     . guaiFENesin (MUCINEX) 600 MG 12 hr tablet Take by mouth 2 (two) times daily as needed.    . hydrochlorothiazide (HYDRODIURIL) 25 MG tablet Take by mouth.    Marland Kitchen HYDROcodone-acetaminophen (NORCO/VICODIN) 5-325 MG per tablet Take 1-2 tablets by mouth every 6 (six) hours as needed.     Marland Kitchen levothyroxine (SYNTHROID, LEVOTHROID) 75 MCG tablet Take 75 mcg by mouth daily before breakfast.     . linaclotide (LINZESS) 290 MCG CAPS capsule Take 290 mcg by mouth daily before breakfast.    . liraglutide 18 MG/3ML SOPN Inject 0.6 mg into the skin daily.    . magnesium oxide (MAG-OX) 400 MG tablet TAKE ONE TABLET BY MOUTH TWICE DAILY    . metoCLOPramide (REGLAN) 5 MG tablet Take 5 mg by mouth 4 (four) times daily.    . metoprolol (LOPRESSOR) 50 MG tablet  Take 50 mg by mouth 2 (two) times daily.     . potassium chloride (KLOR-CON M10) 10 MEQ tablet TAKE ONE TABLET BY MOUTH THREE TIMES DAILY    . RABEprazole (ACIPHEX) 20 MG tablet Take by mouth.    . simvastatin (ZOCOR) 80 MG tablet Take 80 mg by mouth daily at 6 PM.     . tiotropium (SPIRIVA) 18 MCG inhalation capsule Place 18 mcg into inhaler and inhale daily.    . metolazone (ZAROXOLYN) 2.5 MG tablet Take by mouth.    . ranitidine (ZANTAC) 150 MG tablet Take  by mouth.    . torsemide (DEMADEX) 20 MG tablet Take by mouth.     No current facility-administered medications for this visit.     Review of Systems:  GENERAL:  Feels "ok".  No fevers or sweats.  Weight loss of 3-4 pounds, intentional. PERFORMANCE STATUS (ECOG):  2 HEENT:  Blurred vision at times.  No runny nose, sore throat, mouth sores or tenderness. Lungs: No shortness of breath.  Intermittent cough.  No hemoptysis. Cardiac:  No chest pain, palpitations, orthopnea, or PND. GI:  No nausea, vomiting, diarrhea, constipation, melena or hematochezia.  EGD in 01/2016. GU:  No urgency, frequency, dysuria, or hematuria. "Bladder reboot".  Control issue. Musculoskeletal:  Back issues with radiation down into right leg.  No joint pain.  No muscle tenderness. Extremities:  No pain or swelling. Skin:  No rashes or skin changes. Neuro:  No headache, numbness or weakness, balance or coordination issues. Endocrine:  No diabetes, thyroid issues, hot flashes.  Night sweats at times. Psych:  No mood changes, depression or anxiety. Pain:  No focal pain. Review of systems:  All other systems reviewed and found to be negative.  Physical Exam: Blood pressure (!) 148/77, pulse 76, temperature 97.6 F (36.4 C), temperature source Tympanic, resp. rate 18, weight (!) 347 lb 6 oz (157.6 kg). GENERAL:  Well developed, well nourished, overweight woman sitting comfortably in a wheelchair in the exam room in no acute distress. MENTAL STATUS:  Alert and  oriented to person, place and time. HEAD:  Curly black hair.  Normocephalic, atraumatic, face symmetric, no Cushingoid features. EYES:  Brown eyes.  Pupils equal round and reactive to light and accomodation.  No conjunctivitis or scleral icterus. ENT:  Oropharynx clear without lesion.  Tongue normal.  Dentures.  Mucous membranes moist.  RESPIRATORY:  Clear to auscultation without rales, wheezes or rhonchi. CARDIOVASCULAR:  Regular rate and rhythm without murmur, rub or gallop. BREAST:  Right breast without masses, skin changes or nipple discharge.  Left breast without masses, skin changes or nipple discharge.  ABDOMEN:  Soft, non-tender, with active bowel sounds, and no appreciable hepatosplenomegaly.  No masses. SKIN:  Moist under breasts.  No rashes, ulcers or lesions. EXTREMITIES: Chronic lower extremity changes.  No skin discoloration or tenderness.  No palpable cords. LYMPH NODES: No palpable cervical, supraclavicular, axillary or inguinal adenopathy  NEUROLOGICAL: Unremarkable. PSYCH:  Appropriate.   Appointment on 07/01/2016  Component Date Value Ref Range Status  . WBC 07/01/2016 8.9  3.6 - 11.0 K/uL Final  . RBC 07/01/2016 4.77  3.80 - 5.20 MIL/uL Final  . Hemoglobin 07/01/2016 12.5  12.0 - 16.0 g/dL Final  . HCT 07/01/2016 37.6  35.0 - 47.0 % Final  . MCV 07/01/2016 78.8* 80.0 - 100.0 fL Final  . MCH 07/01/2016 26.1  26.0 - 34.0 pg Final  . MCHC 07/01/2016 33.1  32.0 - 36.0 g/dL Final  . RDW 07/01/2016 14.5  11.5 - 14.5 % Final  . Platelets 07/01/2016 264  150 - 440 K/uL Final  . Neutrophils Relative % 07/01/2016 72  % Final  . Neutro Abs 07/01/2016 6.4  1.4 - 6.5 K/uL Final  . Lymphocytes Relative 07/01/2016 21  % Final  . Lymphs Abs 07/01/2016 1.8  1.0 - 3.6 K/uL Final  . Monocytes Relative 07/01/2016 6  % Final  . Monocytes Absolute 07/01/2016 0.5  0.2 - 0.9 K/uL Final  . Eosinophils Relative 07/01/2016 1  % Final  . Eosinophils Absolute 07/01/2016 0.1  0 -  0.7 K/uL  Final  . Basophils Relative 07/01/2016 0  % Final  . Basophils Absolute 07/01/2016 0.0  0 - 0.1 K/uL Final  . Sodium 07/01/2016 136  135 - 145 mmol/L Final  . Potassium 07/01/2016 4.3  3.5 - 5.1 mmol/L Final  . Chloride 07/01/2016 98* 101 - 111 mmol/L Final  . CO2 07/01/2016 29  22 - 32 mmol/L Final  . Glucose, Bld 07/01/2016 148* 65 - 99 mg/dL Final  . BUN 07/01/2016 14  6 - 20 mg/dL Final  . Creatinine, Ser 07/01/2016 1.18* 0.44 - 1.00 mg/dL Final  . Calcium 07/01/2016 9.7  8.9 - 10.3 mg/dL Final  . Total Protein 07/01/2016 8.0  6.5 - 8.1 g/dL Final  . Albumin 07/01/2016 4.1  3.5 - 5.0 g/dL Final  . AST 07/01/2016 28  15 - 41 U/L Final  . ALT 07/01/2016 23  14 - 54 U/L Final  . Alkaline Phosphatase 07/01/2016 63  38 - 126 U/L Final  . Total Bilirubin 07/01/2016 0.9  0.3 - 1.2 mg/dL Final  . GFR calc non Af Amer 07/01/2016 48* >60 mL/min Final  . GFR calc Af Amer 07/01/2016 56* >60 mL/min Final   Comment: (NOTE) The eGFR has been calculated using the CKD EPI equation. This calculation has not been validated in all clinical situations. eGFR's persistently <60 mL/min signify possible Chronic Kidney Disease.   . Anion gap 07/01/2016 9  5 - 15 Final  Office Visit on 06/30/2016  Component Date Value Ref Range Status  . Scan Result 06/30/2016 15   Final    Assessment:  Teresa Holland is a 66 y.o. female with stage I right breast cancer s/p  partial mastectomy on 09/20/2014.  Pathology revealed a 0.9 cm grade 1 invasive mammary carcinoma. There was no lymphovascular invasion.  Initial caudal margin was positive with invasive carcinoma. Re-resection was negative. One sentinel lymph node was negative.  Tumor was ER+ (> 90%), PR+ (>90%), and Her2/neu 1+.  Pathologic stage was T1bN0.  She did not receive radiation secondary to her morbid conditions. She could not lay flat for radiation.  She started Femara in 10/2014. She switched to Aromasin secondary to poor tolerance of  Femara.  Bilateral diagnostic mammogram on 01/15/2016 revealed no evidence of malignancy in either breast. There were lumpectomy changes in the outer right breast.  CA27.29 has been followed: 34.0 on 12/19/2015 and 28.3 on 07/01/2016.  Bone density on 05/20/2011 was normal with a T-score of 0.4 in the left femur.  Bone scan on 01/01/2016 revealed multifocal degenerative changes without evidence of metastatic disease.  Symptomatically, she notes a breast pain in the cold weather. She is limited and can only walk short distances.  She has microcytic RBC indices.  Plan: 1.  Review entire medical history, diagnosis and management of breast cancer.  Discuss continuation of Aromasin.  Next mammogram due 12/2016. 2.  Labs today:  CBC with diff, CMP, CA27.29, ferritin, iron studies. 3.  Continue Aromasin. 4.  Discuss calcium and vitamin D. 5.  Bone density next available. 6.  RTC in 6 months for MD assessment and labs (CBC with diff, CMP, CA27.29).   Lequita Asal, MD  07/01/2016, 3:24 PM

## 2016-07-02 LAB — CA 27.29 (SERIAL MONITOR): CA 27.29: 28.3 U/mL (ref 0.0–38.6)

## 2016-07-06 NOTE — Progress Notes (Signed)
Chief Complaint:  Chief Complaint  Patient presents with  . PTNS    urge incontinence     HPI: 66 yo AAF who presents today to start a 12 weekly course of PTNS treatments. #8/12 today.    Background history Patient is a 40 -year-old [place] American female who is referred to [place] by, Dr. Sabra Heck, for urinary incontinence who presents with her husband, [name].  Patient states that she has had urinary incontinence for the last three to four months.  Patient has incontinence with urgency.   She is experiencing  Several incontinent episodes during the day. She is experiencing 4 incontinent episodes during the night.  Her incontinence volume is large.   She is wearing greater than 10 pads/depends daily.  She is having associated urinary frequency, urgency, nocturia and intermittency.  She does not have a history of urinary tract infections, STI's or injury to the bladder.   She denies dysuria, gross hematuria, suprapubic pain, back pain, abdominal pain or flank pain.  She has not had any recent fevers, chills, nausea or vomiting.   She does not have a history of nephrolithiasis, GU surgery or GU trauma.  She is sexually active.  She has not noted incontinence with sexual intercourse.  She is post menopausal.   She admits to constipation.  She is not having pain with bladder filling.  She has not had any recent imaging studies.  She is drinking 64 oz of water daily.   She is drinking an occasional caffeinated beverages daily.  She is not drinking alcoholic beverages daily.    Her risk factors for incontinence are obesity, a family history of incontinence, age, caffeine, diabetes, depression, vaginal atrophy, pelvic surgery and nerve damage of the lumbar area.  She is taking antihistamines, decongestants, diuretics and antidepressants.    Her UA was unremarkable.  Her PVR was 26 mL.            Previous Therapy:     Behavioral Modification Techniques (discuss all modalities attempted with start/stop  dates)            Pelvic Floor Exercises - patient deferred           Biofeedback - patient deferred           Bladder Training - advise patient to void every two hours while awake           Fluid Management - good water intake           Dietary Restrictions - NONE       Patient did not want to try medications for her incontinence due to gut transit issues.   Contraindications present for PTNS      Pacemaker - NO      Implantable defibrillator - NO      History of abnormal bleeding - NO      History of neuropathies or nerve damage - NO  Discussed with patient possible complications of procedure, such as discomfort, bleeding at insertion/stimulation site, procedure consent signed  Patient goals: To reduce the amount of incontinence episodes.    Today, she is complaining of frequency and nocturia.  She is not experiencing dysuria, gross hematuria or suprapubic pain. She denies any fevers, chills, nausea or vomiting.  Baseline symptoms consist of 10 daytime voids, 4-5 night time voids and 0 incontinence.  After her last treatment, she is experiencing 10 day time voids (stable), 4 night time voids (stable) and 8 incontinence episodes daily (worse).  PMH: Past Medical History:  Diagnosis Date  . Anxiety   . Arthritis   . Asthma   . Back pain   . Breast cancer ([place]) 2016   right breast  . Cancer ([place])    breast  . CHF (congestive heart failure) ([place])   . Complication of anesthesia    heart issues 2004 during gb surgery had to be revived  . COPD (chronic obstructive pulmonary disease) ([place])   . Depression   . Diabetes mellitus without complication ([place])   . GERD (gastroesophageal reflux disease)   . Gout   . Headache   . Hypertension   . Hypothyroidism   . Obesity   . Pain    chronic back  . Shortness of breath dyspnea   . Sleep apnea    could not use cpap uses 1.5 l [place] at night    Surgical History: Past Surgical History:  Procedure Laterality Date  . abd pain    .  ABDOMINAL HYSTERECTOMY    . ANKLE FRACTURE SURGERY    . BACK SURGERY     lumbar fusion  . BREAST BIOPSY Left 2004   negative  . CHOLECYSTECTOMY    . ESOPHAGOGASTRODUODENOSCOPY (EGD) WITH PROPOFOL N/A 02/06/2016   Procedure: ESOPHAGOGASTRODUODENOSCOPY (EGD) WITH PROPOFOL;  Surgeon: Lollie Sails, MD;  Location: [facility] ENDOSCOPY;  Service: Endoscopy;  Laterality: N/A;  . MASTECTOMY Right 2016  . OOPHORECTOMY     one ovary remaining  . PARTIAL MASTECTOMY WITH NEEDLE LOCALIZATION Right 09/20/2014   Procedure: PARTIAL MASTECTOMY WITH NEEDLE LOCALIZATION;  Surgeon: Leonie Green, MD;  Location: [facility] ORS;  Service: General;  Laterality: Right;  . SENTINEL NODE BIOPSY Right 09/20/2014   Procedure: SENTINEL NODE BIOPSY;  Surgeon: Leonie Green, MD;  Location: [facility] ORS;  Service: General;  Laterality: Right;    Home Medications:  Allergies as of 07/07/2016      Reactions   Aspirin Nausea Only   Stomach burning   Furosemide Palpitations   Latex Itching, Rash, Other (See Comments)   SNEEZING   Meloxicam Nausea Only   Nsaids Nausea Only   Tape Rash      Medication List       Accurate as of 07/07/16  3:57 PM. Always use your most recent med list.          calcium carbonate 600 MG Tabs tablet Commonly known as:  CALCIUM 600 Take 1 tablet (600 mg total) by mouth 2 (two) times daily with a meal.   dexlansoprazole 60 MG capsule Commonly known as:  DEXILANT Take 60 mg by mouth daily.   diphenhydrAMINE 25 mg capsule Commonly known as:  BENADRYL Take 25 mg by mouth as needed.   enalapril 20 MG tablet Commonly known as:  VASOTEC Take 20 mg by mouth every morning.   exemestane 25 MG tablet Commonly known as:  AROMASIN TAKE 1 TABLET BY MOUTH ONCE DAILY AFTER BREAKFAST   gabapentin 300 MG capsule Commonly known as:  NEURONTIN Take 300 mg by mouth 3 (three) times daily.   glimepiride 2 MG tablet Commonly known as:  AMARYL Take 2 mg by mouth daily with breakfast.     guaiFENesin 600 MG 12 hr tablet Commonly known as:  MUCINEX Take by mouth 2 (two) times daily as needed.   hydrochlorothiazide 25 MG tablet Commonly known as:  HYDRODIURIL Take by mouth.   HYDROcodone-acetaminophen 5-325 MG tablet Commonly known as:  NORCO/VICODIN Take 1-2 tablets by mouth every 6 (six) hours as  needed.   KLOR-CON M10 10 MEQ tablet Generic drug:  potassium chloride TAKE ONE TABLET BY MOUTH THREE TIMES DAILY   levothyroxine 75 MCG tablet Commonly known as:  SYNTHROID, LEVOTHROID Take 75 mcg by mouth daily before breakfast.   linaclotide 290 MCG Caps capsule Commonly known as:  LINZESS Take 290 mcg by mouth daily before breakfast.   liraglutide 18 MG/3ML Sopn Inject 0.6 mg into the skin daily.   magnesium oxide 400 MG tablet Commonly known as:  MAG-OX TAKE ONE TABLET BY MOUTH TWICE DAILY   metoCLOPramide 5 MG tablet Commonly known as:  REGLAN Take 5 mg by mouth 4 (four) times daily.   metolazone 2.5 MG tablet Commonly known as:  ZAROXOLYN Take by mouth.   metoprolol 50 MG tablet Commonly known as:  LOPRESSOR Take 50 mg by mouth 2 (two) times daily.   RABEprazole 20 MG tablet Commonly known as:  ACIPHEX Take by mouth.   ranitidine 150 MG tablet Commonly known as:  ZANTAC Take by mouth.   simvastatin 80 MG tablet Commonly known as:  ZOCOR Take 80 mg by mouth daily at 6 PM.   tiotropium 18 MCG inhalation capsule Commonly known as:  SPIRIVA Place 18 mcg into inhaler and inhale daily.   torsemide 20 MG tablet Commonly known as:  DEMADEX Take by mouth.   VENTOLIN HFA 108 (90 Base) MCG/ACT inhaler Generic drug:  albuterol Inhale 2 puffs into the lungs 4 (four) times daily.   Vitamin D3 2000 units capsule Take by mouth.       Allergies:  Allergies  Allergen Reactions  . Aspirin Nausea Only    Stomach burning  . Furosemide Palpitations  . Latex Itching, Rash and Other (See Comments)    SNEEZING  . Meloxicam Nausea Only  .  Nsaids Nausea Only  . Tape Rash    Family History: Family History  Problem Relation Age of Onset  . Breast cancer Paternal Aunt     36's  . Breast cancer Maternal Aunt 68  . Breast cancer Maternal Aunt 67  . Colon cancer Mother   . Lung cancer Father   . Prostate cancer Neg Hx   . Kidney cancer Neg Hx   . Bladder Cancer Neg Hx     Social History:  reports that she has quit smoking. Her smoking use included E-cigarettes. She has never used smokeless tobacco. She reports that she drinks alcohol. She reports that she does not use drugs.  ROS: UROLOGY Frequent Urination?: Yes Hard to postpone urination?: No Burning/pain with urination?: No Get up at night to urinate?: Yes Leakage of urine?: No Urine stream starts and stops?: No Trouble starting stream?: No Do you have to strain to urinate?: No Blood in urine?: No Urinary tract infection?: No Sexually transmitted disease?: No Injury to kidneys or bladder?: No Painful intercourse?: No Weak stream?: No Currently pregnant?: No Vaginal bleeding?: No Last menstrual period?: n  Gastrointestinal Nausea?: Yes Vomiting?: No Indigestion/heartburn?: No Diarrhea?: Yes Constipation?: Yes  Constitutional Fever: No Night sweats?: Yes Weight loss?: No Fatigue?: Yes  Skin Skin rash/lesions?: No Itching?: Yes  Eyes Blurred vision?: Yes Double vision?: No  Ears/Nose/Throat Sore throat?: No Sinus problems?: Yes  Hematologic/Lymphatic Swollen glands?: No Easy bruising?: No  Cardiovascular Leg swelling?: Yes Chest pain?: No  Respiratory Cough?: Yes Shortness of breath?: No  Endocrine Excessive thirst?: No  Musculoskeletal Back pain?: Yes Joint pain?: Yes  Neurological Headaches?: No Dizziness?: No  Psychologic Depression?: No Anxiety?: No  Physical Exam: BP (!) 157/67   Pulse 77   Ht 5\' 8"  (1.727 m)   Wt (!) 356 lb 12.8 oz (161.8 kg)   BMI 54.25 kg/m   Constitutional: Well nourished. Alert  and oriented, No acute distress. HEENT: [place] AT, moist mucus membranes. Trachea midline, no masses. Cardiovascular: No clubbing, cyanosis, or edema. Respiratory: Normal respiratory effort, no increased work of breathing. Skin: No rashes, bruises or suspicious lesions. Lymph: No cervical or inguinal adenopathy. Neurologic: Grossly intact, no focal deficits, moving all 4 extremities. Psychiatric: Normal mood and affect.  Laboratory Data: Lab Results  Component Value Date   WBC 8.9 07/01/2016   HGB 12.5 07/01/2016   HCT 37.6 07/01/2016   MCV 78.8 (L) 07/01/2016   PLT 264 07/01/2016    Lab Results  Component Value Date   CREATININE 1.18 (H) 07/01/2016    Lab Results  Component Value Date   HGBA1C 8.0 (H) 10/23/2012    Lab Results  Component Value Date   AST 28 07/01/2016   Lab Results  Component Value Date   ALT 23 07/01/2016    PTNS treatment: The needle electrode was inserted into the lower, inner aspect of the patient's left leg. The surface electrode was placed on the inside arch of the foot on the treatment leg. The lead set was connected to the stimulator and the needle electrode clip was connected to the needle electrode. The stimulator that produces an adjustable electrical pulse that travels to the sacral nerve plexus via the tibial nerve was increased to 9 until the patient received a toe flex and sensory response.     Assessment & Plan:   1. Incontinence  - Treatment Plan:  The needle electrode was removed without difficulty to the patient.  Patient tolerated the procedure for 30 minutes.  She will return next week for # 9 out of 12 of their weekly PTNS treatment's  Return in about 1 week (around 07/14/2016) for # 9 PTNS.  These notes generated with voice recognition software. I apologize for typographical errors.  Zara Council, [facility] [address] [place], [place] [address] [phone]

## 2016-07-07 ENCOUNTER — Encounter: Payer: Self-pay | Admitting: Urology

## 2016-07-07 ENCOUNTER — Ambulatory Visit: Payer: Medicare PPO | Admitting: Urology

## 2016-07-07 VITALS — BP 157/67 | HR 77 | Ht 68.0 in | Wt 356.8 lb

## 2016-07-07 DIAGNOSIS — N3941 Urge incontinence: Secondary | ICD-10-CM

## 2016-07-07 LAB — PTNS-PERCUTANEOUS TIBIAL NERVE STIMULATION: SCAN RESULT: 9

## 2016-07-07 NOTE — Progress Notes (Signed)
PTNS  Session # 8  Health & Social Factors: No Change Caffeine: 1 Alcohol: 0 Daytime voids #per day: 10 Night-time voids #per night: 4 Urgency: Strong Incontinence Episodes #per day: 8 Ankle used: Left Treatment Setting: 9 Feeling/ Response: Both  Preformed By: Zara Council PA-C  Assistant: Lyndee Hensen CMA  Follow Up: One week

## 2016-07-11 ENCOUNTER — Encounter: Payer: Self-pay | Admitting: Hematology and Oncology

## 2016-07-12 NOTE — Progress Notes (Signed)
Chief Complaint:  Chief Complaint  Patient presents with  . PTNS    Urge incontinence     HPI: 66 yo AAF who presents today to start a 12 weekly course of PTNS treatments. #9/12 today.    Background history Patient is a 20 -year-old [place] American female who is referred to [place] by, Dr. Sabra Heck, for urinary incontinence who presents with her husband, [name].  Patient states that she has had urinary incontinence for the last three to four months.  Patient has incontinence with urgency.   She is experiencing  Several incontinent episodes during the day. She is experiencing 4 incontinent episodes during the night.  Her incontinence volume is large.   She is wearing greater than 10 pads/depends daily.  She is having associated urinary frequency, urgency, nocturia and intermittency.  She does not have a history of urinary tract infections, STI's or injury to the bladder.   She denies dysuria, gross hematuria, suprapubic pain, back pain, abdominal pain or flank pain.  She has not had any recent fevers, chills, nausea or vomiting.   She does not have a history of nephrolithiasis, GU surgery or GU trauma.  She is sexually active.  She has not noted incontinence with sexual intercourse.  She is post menopausal.   She admits to constipation.  She is not having pain with bladder filling.  She has not had any recent imaging studies.  She is drinking 64 oz of water daily.   She is drinking an occasional caffeinated beverages daily.  She is not drinking alcoholic beverages daily.    Her risk factors for incontinence are obesity, a family history of incontinence, age, caffeine, diabetes, depression, vaginal atrophy, pelvic surgery and nerve damage of the lumbar area.  She is taking antihistamines, decongestants, diuretics and antidepressants.    Her UA was unremarkable.  Her PVR was 26 mL.            Previous Therapy:     Behavioral Modification Techniques (discuss all modalities attempted with start/stop  dates)            Pelvic Floor Exercises - patient deferred           Biofeedback - patient deferred           Bladder Training - advise patient to void every two hours while awake           Fluid Management - good water intake           Dietary Restrictions - NONE       Patient did not want to try medications for her incontinence due to gut transit issues.   Contraindications present for PTNS      Pacemaker - NO      Implantable defibrillator - NO      History of abnormal bleeding - NO      History of neuropathies or nerve damage - NO  Discussed with patient possible complications of procedure, such as discomfort, bleeding at insertion/stimulation site, procedure consent signed  Patient goals: To reduce the amount of incontinence episodes.    Today, she is complaining of frequency and nocturia.  She is not experiencing dysuria, gross hematuria or suprapubic pain. She denies any fevers, chills, nausea or vomiting.  Baseline symptoms consist of 10 daytime voids, 4-5 night time voids and 0 incontinence.  After her last treatment, she is experiencing day time voids 9 (improved), 4 night time voids (stable) and 4 incontinence episodes daily (worse).  PMH: Past Medical History:  Diagnosis Date  . Anxiety   . Arthritis   . Asthma   . Back pain   . Breast cancer ([place]) 2016   right breast  . Cancer ([place])    breast  . CHF (congestive heart failure) ([place])   . Complication of anesthesia    heart issues 2004 during gb surgery had to be revived  . COPD (chronic obstructive pulmonary disease) ([place])   . Depression   . Diabetes mellitus without complication ([place])   . GERD (gastroesophageal reflux disease)   . Gout   . Headache   . Hypertension   . Hypothyroidism   . Obesity   . Pain    chronic back  . Shortness of breath dyspnea   . Sleep apnea    could not use cpap uses 1.5 l [place] at night    Surgical History: Past Surgical History:  Procedure Laterality Date  . abd pain      . ABDOMINAL HYSTERECTOMY    . ANKLE FRACTURE SURGERY    . BACK SURGERY     lumbar fusion  . BREAST BIOPSY Left 2004   negative  . CHOLECYSTECTOMY    . ESOPHAGOGASTRODUODENOSCOPY (EGD) WITH PROPOFOL N/A 02/06/2016   Procedure: ESOPHAGOGASTRODUODENOSCOPY (EGD) WITH PROPOFOL;  Surgeon: Lollie Sails, MD;  Location: [facility] ENDOSCOPY;  Service: Endoscopy;  Laterality: N/A;  . MASTECTOMY Right 2016  . OOPHORECTOMY     one ovary remaining  . PARTIAL MASTECTOMY WITH NEEDLE LOCALIZATION Right 09/20/2014   Procedure: PARTIAL MASTECTOMY WITH NEEDLE LOCALIZATION;  Surgeon: Leonie Green, MD;  Location: [facility] ORS;  Service: General;  Laterality: Right;  . SENTINEL NODE BIOPSY Right 09/20/2014   Procedure: SENTINEL NODE BIOPSY;  Surgeon: Leonie Green, MD;  Location: [facility] ORS;  Service: General;  Laterality: Right;    Home Medications:  Allergies as of 07/14/2016      Reactions   Aspirin Nausea Only   Stomach burning   Furosemide Palpitations   Latex Itching, Rash, Other (See Comments)   SNEEZING   Meloxicam Nausea Only   Nsaids Nausea Only   Tape Rash      Medication List       Accurate as of 07/14/16  4:46 PM. Always use your most recent med list.          calcium carbonate 600 MG Tabs tablet Commonly known as:  CALCIUM 600 Take 1 tablet (600 mg total) by mouth 2 (two) times daily with a meal.   dexlansoprazole 60 MG capsule Commonly known as:  DEXILANT Take 60 mg by mouth daily.   enalapril 20 MG tablet Commonly known as:  VASOTEC Take 20 mg by mouth every morning.   exemestane 25 MG tablet Commonly known as:  AROMASIN TAKE 1 TABLET BY MOUTH ONCE DAILY AFTER BREAKFAST   gabapentin 300 MG capsule Commonly known as:  NEURONTIN Take 300 mg by mouth 3 (three) times daily.   glimepiride 2 MG tablet Commonly known as:  AMARYL Take 2 mg by mouth daily with breakfast.   guaiFENesin 600 MG 12 hr tablet Commonly known as:  MUCINEX Take by mouth 2 (two) times  daily as needed.   hydrochlorothiazide 25 MG tablet Commonly known as:  HYDRODIURIL Take by mouth.   HYDROcodone-acetaminophen 5-325 MG tablet Commonly known as:  NORCO/VICODIN Take 1-2 tablets by mouth every 6 (six) hours as needed.   KLOR-CON M10 10 MEQ tablet Generic drug:  potassium chloride TAKE ONE TABLET BY MOUTH  THREE TIMES DAILY   levothyroxine 75 MCG tablet Commonly known as:  SYNTHROID, LEVOTHROID Take 75 mcg by mouth daily before breakfast.   liraglutide 18 MG/3ML Sopn Inject 0.6 mg into the skin daily.   magnesium oxide 400 MG tablet Commonly known as:  MAG-OX TAKE ONE TABLET BY MOUTH TWICE DAILY   metoCLOPramide 5 MG tablet Commonly known as:  REGLAN Take 5 mg by mouth 4 (four) times daily.   metolazone 2.5 MG tablet Commonly known as:  ZAROXOLYN Take by mouth.   metoprolol tartrate 50 MG tablet Commonly known as:  LOPRESSOR Take 50 mg by mouth 2 (two) times daily.   RABEprazole 20 MG tablet Commonly known as:  ACIPHEX Take by mouth.   ranitidine 150 MG tablet Commonly known as:  ZANTAC Take by mouth.   simvastatin 80 MG tablet Commonly known as:  ZOCOR Take 80 mg by mouth daily at 6 PM.   tiotropium 18 MCG inhalation capsule Commonly known as:  SPIRIVA Place 18 mcg into inhaler and inhale daily.   torsemide 20 MG tablet Commonly known as:  DEMADEX Take by mouth.   VENTOLIN HFA 108 (90 Base) MCG/ACT inhaler Generic drug:  albuterol Inhale 2 puffs into the lungs 4 (four) times daily.   Vitamin D3 2000 units capsule Take by mouth.       Allergies:  Allergies  Allergen Reactions  . Aspirin Nausea Only    Stomach burning  . Furosemide Palpitations  . Latex Itching, Rash and Other (See Comments)    SNEEZING  . Meloxicam Nausea Only  . Nsaids Nausea Only  . Tape Rash    Family History: Family History  Problem Relation Age of Onset  . Breast cancer Paternal Aunt        39's  . Breast cancer Maternal Aunt 68  . Breast cancer  Maternal Aunt 67  . Colon cancer Mother   . Lung cancer Father   . Prostate cancer Neg Hx   . Kidney cancer Neg Hx   . Bladder Cancer Neg Hx     Social History:  reports that she has quit smoking. Her smoking use included E-cigarettes. She has never used smokeless tobacco. She reports that she drinks alcohol. She reports that she does not use drugs.  ROS: UROLOGY Frequent Urination?: Yes Hard to postpone urination?: Yes Burning/pain with urination?: No Get up at night to urinate?: Yes Leakage of urine?: Yes Urine stream starts and stops?: No Trouble starting stream?: No Do you have to strain to urinate?: No Blood in urine?: No Urinary tract infection?: No Sexually transmitted disease?: No Injury to kidneys or bladder?: No Painful intercourse?: No Weak stream?: No Currently pregnant?: No Vaginal bleeding?: No Last menstrual period?: n  Gastrointestinal Nausea?: Yes Vomiting?: No Indigestion/heartburn?: No Diarrhea?: Yes Constipation?: Yes  Constitutional Fever: No Night sweats?: Yes Weight loss?: No Fatigue?: No  Skin Skin rash/lesions?: No Itching?: Yes  Eyes Blurred vision?: No Double vision?: No  Ears/Nose/Throat Sore throat?: No Sinus problems?: Yes  Hematologic/Lymphatic Swollen glands?: No Easy bruising?: No  Cardiovascular Leg swelling?: Yes Chest pain?: No  Respiratory Cough?: Yes Shortness of breath?: No  Endocrine Excessive thirst?: No  Musculoskeletal Back pain?: Yes Joint pain?: Yes  Neurological Headaches?: Yes Dizziness?: No  Psychologic Depression?: No Anxiety?: No   Physical Exam: BP 132/76   Pulse 84   Ht 5\' 8"  (1.727 m)   Wt (!) 360 lb 4.8 oz (163.4 kg)   BMI 54.78 kg/m   Constitutional: Well  nourished. Alert and oriented, No acute distress. HEENT: [place] AT, moist mucus membranes. Trachea midline, no masses. Cardiovascular: No clubbing, cyanosis, or edema. Respiratory: Normal respiratory effort, no increased  work of breathing. Skin: No rashes, bruises or suspicious lesions. Lymph: No cervical or inguinal adenopathy. Neurologic: Grossly intact, no focal deficits, moving all 4 extremities. Psychiatric: Normal mood and affect.  Laboratory Data: Lab Results  Component Value Date   WBC 8.9 07/01/2016   HGB 12.5 07/01/2016   HCT 37.6 07/01/2016   MCV 78.8 (L) 07/01/2016   PLT 264 07/01/2016    Lab Results  Component Value Date   CREATININE 1.18 (H) 07/01/2016    Lab Results  Component Value Date   HGBA1C 8.0 (H) 10/23/2012    Lab Results  Component Value Date   AST 28 07/01/2016   Lab Results  Component Value Date   ALT 23 07/01/2016    PTNS treatment: The needle electrode was inserted into the lower, inner aspect of the patient's left leg. The surface electrode was placed on the inside arch of the foot on the treatment leg. The lead set was connected to the stimulator and the needle electrode clip was connected to the needle electrode. The stimulator that produces an adjustable electrical pulse that travels to the sacral nerve plexus via the tibial nerve was increased to 4 until the patient received a toe flex and a sensory response.     Assessment & Plan:   1. Incontinence  - Treatment Plan:  The needle electrode was removed without difficulty to the patient.  Patient tolerated the procedure for 30 minutes.  She will return next week for # 10 out of 12 of their weekly PTNS treatment's  Return in about 1 week (around 07/21/2016) for # 10 PTNS.  These notes generated with voice recognition software. I apologize for typographical errors.  Zara Council, [facility] [address], [place], [place] [address] [phone]

## 2016-07-14 ENCOUNTER — Ambulatory Visit (INDEPENDENT_AMBULATORY_CARE_PROVIDER_SITE_OTHER): Payer: Medicare PPO | Admitting: Urology

## 2016-07-14 ENCOUNTER — Encounter: Payer: Self-pay | Admitting: Urology

## 2016-07-14 VITALS — BP 132/76 | HR 84 | Ht 68.0 in | Wt 360.3 lb

## 2016-07-14 DIAGNOSIS — N3941 Urge incontinence: Secondary | ICD-10-CM

## 2016-07-14 LAB — PTNS-PERCUTANEOUS TIBIAL NERVE STIMULATION: Scan Result: 4

## 2016-07-14 NOTE — Progress Notes (Signed)
PTNS  Session # 9  Health & Social Factors: No Change Caffeine: 1 Alcohol: 0 Daytime voids #per day: 9 Night-time voids #per night: 4 Urgency: Strong Incontinence Episodes #per day: 4 Ankle used: Left Treatment Setting: 4 Feeling/ Response: Both  Preformed By: Zara Council PA-C  Assistant: Lyndee Hensen CMA  Follow Up: One Week

## 2016-07-21 ENCOUNTER — Encounter: Payer: Self-pay | Admitting: Urology

## 2016-07-21 ENCOUNTER — Ambulatory Visit: Payer: Medicare PPO | Admitting: Urology

## 2016-07-21 VITALS — BP 148/86 | HR 80 | Ht 68.0 in

## 2016-07-21 DIAGNOSIS — N3941 Urge incontinence: Secondary | ICD-10-CM

## 2016-07-21 NOTE — Progress Notes (Signed)
62/26/3335 4:56 PM   Teresa Holland Teresa Holland [birth year] [id number]  Referring provider: Rusty Aus, MD [place] [facility] [place], [place] [address]  No chief complaint on file.  HPI: 66 yo AAF who presents today to start a 12 weekly course of PTNS treatments. #10/12 today.    Background history Patient is a 37 -year-old [place] American female who is referred to [place] by, Dr. Sabra Holland, for urinary incontinence who presents with her husband, Teresa Holland.  Patient states that she has had urinary incontinence for the last three to four months.  Patient has incontinence with urgency.   She is experiencing  Several incontinent episodes during the day. She is experiencing 4 incontinent episodes during the night.  Her incontinence volume is large.   She is wearing greater than 10 pads/depends daily.  She is having associated urinary frequency, urgency, nocturia and intermittency.  She does not have a history of urinary tract infections, STI's or injury to the bladder.   She denies dysuria, gross hematuria, suprapubic pain, back pain, abdominal pain or flank pain.  She has not had any recent fevers, chills, nausea or vomiting.   She does not have a history of nephrolithiasis, GU surgery or GU trauma.  She is sexually active.  She has not noted incontinence with sexual intercourse.  She is post menopausal.   She admits to constipation.  She is not having pain with bladder filling.  She has not had any recent imaging studies.  She is drinking 64 oz of water daily.   She is drinking an occasional caffeinated beverages daily.  She is not drinking alcoholic beverages daily.    Her risk factors for incontinence are obesity, a family history of incontinence, age, caffeine, diabetes, depression, vaginal atrophy, pelvic surgery and nerve damage of the lumbar area.  She is taking antihistamines, decongestants, diuretics and antidepressants.    Her UA was unremarkable.  Her PVR was 26 mL.              Previous Therapy:     Behavioral Modification Techniques (discuss all modalities attempted with start/stop dates)            Pelvic Floor Exercises - patient deferred           Biofeedback - patient deferred           Bladder Training - advise patient to void every two hours while awake           Fluid Management - good water intake           Dietary Restrictions - NONE       Patient did not want to try medications for her incontinence due to gut transit issues.   Contraindications present for PTNS      Pacemaker - NO      Implantable defibrillator - NO      History of abnormal bleeding - NO      History of neuropathies or nerve damage - NO  Discussed with patient possible complications of procedure, such as discomfort, bleeding at insertion/stimulation site, procedure consent signed  Patient goals: To reduce the amount of incontinence episodes.    Today, she is complaining of frequency and nocturia.  She is not experiencing dysuria, gross hematuria or suprapubic pain. She denies any fevers, chills, nausea or vomiting.  Baseline symptoms consist of 10 daytime voids, 4-5 night time voids and 0 incontinence.  After her last treatment, she is experiencing day time voids  9 (improved), 4 night time voids (stable) and 4-5 incontinence episodes daily (worse).     PMH: Past Medical History:  Diagnosis Date  . Anxiety   . Arthritis   . Asthma   . Back pain   . Breast cancer ([place]) 2016   right breast  . Cancer ([place])    breast  . CHF (congestive heart failure) ([place])   . Complication of anesthesia    heart issues 2004 during gb surgery had to be revived  . COPD (chronic obstructive pulmonary disease) ([place])   . Depression   . Diabetes mellitus without complication ([place])   . GERD (gastroesophageal reflux disease)   . Gout   . Headache   . Hypertension   . Hypothyroidism   . Obesity   . Pain    chronic back  . Shortness of breath dyspnea   . Sleep apnea    could not  use cpap uses 1.5 l [place] at night    Surgical History: Past Surgical History:  Procedure Laterality Date  . abd pain    . ABDOMINAL HYSTERECTOMY    . ANKLE FRACTURE SURGERY    . BACK SURGERY     lumbar fusion  . BREAST BIOPSY Left 2004   negative  . CHOLECYSTECTOMY    . ESOPHAGOGASTRODUODENOSCOPY (EGD) WITH PROPOFOL N/A 02/06/2016   Procedure: ESOPHAGOGASTRODUODENOSCOPY (EGD) WITH PROPOFOL;  Surgeon: Lollie Sails, MD;  Location: [facility] ENDOSCOPY;  Service: Endoscopy;  Laterality: N/A;  . MASTECTOMY Right 2016  . OOPHORECTOMY     one ovary remaining  . PARTIAL MASTECTOMY WITH NEEDLE LOCALIZATION Right 09/20/2014   Procedure: PARTIAL MASTECTOMY WITH NEEDLE LOCALIZATION;  Surgeon: Leonie Green, MD;  Location: [facility] ORS;  Service: General;  Laterality: Right;  . SENTINEL NODE BIOPSY Right 09/20/2014   Procedure: SENTINEL NODE BIOPSY;  Surgeon: Leonie Green, MD;  Location: [facility] ORS;  Service: General;  Laterality: Right;    Home Medications:  Allergies as of 07/21/2016      Reactions   Aspirin Nausea Only   Stomach burning   Furosemide Palpitations   Latex Itching, Rash, Other (See Comments)   SNEEZING   Meloxicam Nausea Only   Nsaids Nausea Only   Tape Rash      Medication List       Accurate as of 07/21/16  3:25 PM. Always use your most recent med list.          calcium carbonate 600 MG Tabs tablet Commonly known as:  CALCIUM 600 Take 1 tablet (600 mg total) by mouth 2 (two) times daily with a meal.   dexlansoprazole 60 MG capsule Commonly known as:  DEXILANT Take 60 mg by mouth daily.   enalapril 20 MG tablet Commonly known as:  VASOTEC Take 20 mg by mouth every morning.   exemestane 25 MG tablet Commonly known as:  AROMASIN TAKE 1 TABLET BY MOUTH ONCE DAILY AFTER BREAKFAST   gabapentin 300 MG capsule Commonly known as:  NEURONTIN Take 300 mg by mouth 3 (three) times daily.   glimepiride 2 MG tablet Commonly known as:  AMARYL Take 2 mg by  mouth daily with breakfast.   guaiFENesin 600 MG 12 hr tablet Commonly known as:  MUCINEX Take by mouth 2 (two) times daily as needed.   hydrochlorothiazide 25 MG tablet Commonly known as:  HYDRODIURIL Take by mouth.   HYDROcodone-acetaminophen 5-325 MG tablet Commonly known as:  NORCO/VICODIN Take 1-2 tablets by mouth every 6 (six) hours as needed.  KLOR-CON M10 10 MEQ tablet Generic drug:  potassium chloride TAKE ONE TABLET BY MOUTH THREE TIMES DAILY   levothyroxine 75 MCG tablet Commonly known as:  SYNTHROID, LEVOTHROID Take 75 mcg by mouth daily before breakfast.   liraglutide 18 MG/3ML Sopn Inject 0.6 mg into the skin daily.   magnesium oxide 400 MG tablet Commonly known as:  MAG-OX TAKE ONE TABLET BY MOUTH TWICE DAILY   metoCLOPramide 5 MG tablet Commonly known as:  REGLAN Take 5 mg by mouth 4 (four) times daily.   metolazone 2.5 MG tablet Commonly known as:  ZAROXOLYN Take by mouth.   metoprolol tartrate 50 MG tablet Commonly known as:  LOPRESSOR Take 50 mg by mouth 2 (two) times daily.   RABEprazole 20 MG tablet Commonly known as:  ACIPHEX Take by mouth.   ranitidine 150 MG tablet Commonly known as:  ZANTAC Take by mouth.   simvastatin 80 MG tablet Commonly known as:  ZOCOR Take 80 mg by mouth daily at 6 PM.   tiotropium 18 MCG inhalation capsule Commonly known as:  SPIRIVA Place 18 mcg into inhaler and inhale daily.   torsemide 20 MG tablet Commonly known as:  DEMADEX Take by mouth.   VENTOLIN HFA 108 (90 Base) MCG/ACT inhaler Generic drug:  albuterol Inhale 2 puffs into the lungs 4 (four) times daily.   Vitamin D3 2000 units capsule Take by mouth.       Allergies:  Allergies  Allergen Reactions  . Aspirin Nausea Only    Stomach burning  . Furosemide Palpitations  . Latex Itching, Rash and Other (See Comments)    SNEEZING  . Meloxicam Nausea Only  . Nsaids Nausea Only  . Tape Rash    Family History: Family History    Problem Relation Age of Onset  . Breast cancer Paternal Aunt        62's  . Breast cancer Maternal Aunt 68  . Breast cancer Maternal Aunt 67  . Colon cancer Mother   . Lung cancer Father   . Prostate cancer Neg Hx   . Kidney cancer Neg Hx   . Bladder Cancer Neg Hx     Social History:  reports that she has quit smoking. Her smoking use included E-cigarettes. She has never used smokeless tobacco. She reports that she drinks alcohol. She reports that she does not use drugs.  ROS:                                        Physical Exam: There were no vitals taken for this visit.  Constitutional:  Alert and oriented, No acute distress. HEENT: [place] AT, moist mucus membranes.  Trachea midline, no masses. Cardiovascular: No clubbing, cyanosis, or edema. Respiratory: Normal respiratory effort, no increased work of breathing. GI: Abdomen is soft, nontender, nondistended, no abdominal masses Skin: No rashes, bruises or suspicious lesions. Neurologic: Grossly intact, no focal deficits, moving all 4 extremities. Psychiatric: Normal mood and affect.  Laboratory Data: Lab Results  Component Value Date   WBC 8.9 07/01/2016   HGB 12.5 07/01/2016   HCT 37.6 07/01/2016   MCV 78.8 (L) 07/01/2016   PLT 264 07/01/2016    Lab Results  Component Value Date   CREATININE 1.18 (H) 07/01/2016    No results found for: PSA  No results found for: TESTOSTERONE  Lab Results  Component Value Date   HGBA1C 8.0 (H) 10/23/2012  Urinalysis    Component Value Date/Time   COLORURINE Yellow 08/25/2012 2020   APPEARANCEUR Cloudy (A) 04/13/2016 1057   LABSPEC 1.018 08/25/2012 2020   PHURINE 6.0 08/25/2012 2020   GLUCOSEU Negative 04/13/2016 1057   GLUCOSEU Negative 08/25/2012 2020   HGBUR 1+ 08/25/2012 2020   BILIRUBINUR Negative 04/13/2016 [place] Negative 08/25/2012 2020   KETONESUR Negative 08/25/2012 2020   PROTEINUR Negative 04/13/2016 1057   PROTEINUR  100 mg/dL 08/25/2012 2020   NITRITE Negative 04/13/2016 1057   NITRITE Negative 08/25/2012 2020   LEUKOCYTESUR Trace (A) 04/13/2016 1057   LEUKOCYTESUR 3+ 08/25/2012 2020      Assessment & Plan:   F/u for PTNS # 11 --   There are no diagnoses linked to this encounter.  No Follow-up on file.  Festus Aloe, [facility] [address], [place], [place] [address] [phone]

## 2016-07-21 NOTE — Progress Notes (Signed)
PTNS  Session # 10  Health & Social Factors: same Caffeine: 0-1 Alcohol: 0 Daytime voids #per day: 9 Night-time voids #per night: 4 Urgency: strong Incontinence Episodes #per day: 4-5 Ankle used: left Treatment Setting: 2 Feeling/ Response: both Comments:   Preformed By: Marily Lente  Assistant: Fonnie Jarvis, CMA  Follow Up: next week

## 2016-07-28 ENCOUNTER — Ambulatory Visit: Payer: Medicare PPO | Admitting: Urology

## 2016-08-02 NOTE — Progress Notes (Signed)
Chief Complaint:  Chief Complaint  Patient presents with  . PTNS    urge incontinence     HPI: 66 yo AAF who presents today to start a 12 weekly course of PTNS treatments. #12/12 today.    Background history Patient is a 69 -year-old [place] American female who is referred to [place] by, Dr. Sabra Heck, for urinary incontinence who presents with her husband, [name].  Patient states that she has had urinary incontinence for the last three to four months.  Patient has incontinence with urgency.   She is experiencing  Several incontinent episodes during the day. She is experiencing 4 incontinent episodes during the night.  Her incontinence volume is large.   She is wearing greater than 10 pads/depends daily.  She is having associated urinary frequency, urgency, nocturia and intermittency.  She does not have a history of urinary tract infections, STI's or injury to the bladder.   She denies dysuria, gross hematuria, suprapubic pain, back pain, abdominal pain or flank pain.  She has not had any recent fevers, chills, nausea or vomiting.   She does not have a history of nephrolithiasis, GU surgery or GU trauma.  She is sexually active.  She has not noted incontinence with sexual intercourse.  She is post menopausal.   She admits to constipation.  She is not having pain with bladder filling.  She has not had any recent imaging studies.  She is drinking 64 oz of water daily.   She is drinking an occasional caffeinated beverages daily.  She is not drinking alcoholic beverages daily.    Her risk factors for incontinence are obesity, a family history of incontinence, age, caffeine, diabetes, depression, vaginal atrophy, pelvic surgery and nerve damage of the lumbar area.  She is taking antihistamines, decongestants, diuretics and antidepressants.    Her UA was unremarkable.  Her PVR was 26 mL.            Previous Therapy:     Behavioral Modification Techniques (discuss all modalities attempted with start/stop  dates)            Pelvic Floor Exercises - patient deferred           Biofeedback - patient deferred           Bladder Training - advise patient to void every two hours while awake           Fluid Management - good water intake           Dietary Restrictions - NONE       Patient did not want to try medications for her incontinence due to gut transit issues.   Contraindications present for PTNS      Pacemaker - NO      Implantable defibrillator - NO      History of abnormal bleeding - NO      History of neuropathies or nerve damage - NO  Discussed with patient possible complications of procedure, such as discomfort, bleeding at insertion/stimulation site, procedure consent signed  Patient goals: To reduce the amount of incontinence episodes.    Today, she is complaining of frequency, urgency and nocturia.  She is not experiencing dysuria, gross hematuria or suprapubic pain. She denies any fevers, chills, nausea or vomiting.  Baseline symptoms consist of 10 daytime voids, 4-5 night time voids and 0 incontinence.  After her last treatment, she is experiencing day time voids 10 (worse), 4 night time voids (stable) and 3 incontinence episodes daily (stable).  PMH: Past Medical History:  Diagnosis Date  . Anxiety   . Arthritis   . Asthma   . Back pain   . Breast cancer ([place]) 2016   right breast  . Cancer ([place])    breast  . CHF (congestive heart failure) ([place])   . Complication of anesthesia    heart issues 2004 during gb surgery had to be revived  . COPD (chronic obstructive pulmonary disease) ([place])   . Depression   . Diabetes mellitus without complication ([place])   . GERD (gastroesophageal reflux disease)   . Gout   . Headache   . Hypertension   . Hypothyroidism   . Obesity   . Pain    chronic back  . Shortness of breath dyspnea   . Sleep apnea    could not use cpap uses 1.5 l [place] at night    Surgical History: Past Surgical History:  Procedure Laterality Date  . abd  pain    . ABDOMINAL HYSTERECTOMY    . ANKLE FRACTURE SURGERY    . BACK SURGERY     lumbar fusion  . BREAST BIOPSY Left 2004   negative  . CHOLECYSTECTOMY    . ESOPHAGOGASTRODUODENOSCOPY (EGD) WITH PROPOFOL N/A 02/06/2016   Procedure: ESOPHAGOGASTRODUODENOSCOPY (EGD) WITH PROPOFOL;  Surgeon: Lollie Sails, MD;  Location: [facility] ENDOSCOPY;  Service: Endoscopy;  Laterality: N/A;  . MASTECTOMY Right 2016  . OOPHORECTOMY     one ovary remaining  . PARTIAL MASTECTOMY WITH NEEDLE LOCALIZATION Right 09/20/2014   Procedure: PARTIAL MASTECTOMY WITH NEEDLE LOCALIZATION;  Surgeon: Leonie Green, MD;  Location: [facility] ORS;  Service: General;  Laterality: Right;  . SENTINEL NODE BIOPSY Right 09/20/2014   Procedure: SENTINEL NODE BIOPSY;  Surgeon: Leonie Green, MD;  Location: [facility] ORS;  Service: General;  Laterality: Right;    Home Medications:  Allergies as of 08/04/2016      Reactions   Aspirin Nausea Only   Stomach burning   Furosemide Palpitations   Latex Itching, Rash, Other (See Comments)   SNEEZING   Meloxicam Nausea Only   Nsaids Nausea Only   Tape Rash      Medication List       Accurate as of 08/04/16 11:59 PM. Always use your most recent med list.          calcium carbonate 600 MG Tabs tablet Commonly known as:  CALCIUM 600 Take 1 tablet (600 mg total) by mouth 2 (two) times daily with a meal.   dexlansoprazole 60 MG capsule Commonly known as:  DEXILANT Take 60 mg by mouth daily.   enalapril 20 MG tablet Commonly known as:  VASOTEC Take 20 mg by mouth every morning.   exemestane 25 MG tablet Commonly known as:  AROMASIN TAKE 1 TABLET BY MOUTH ONCE DAILY AFTER BREAKFAST   gabapentin 300 MG capsule Commonly known as:  NEURONTIN Take 300 mg by mouth 3 (three) times daily.   glimepiride 2 MG tablet Commonly known as:  AMARYL Take 2 mg by mouth daily with breakfast.   guaiFENesin 600 MG 12 hr tablet Commonly known as:  MUCINEX Take by mouth 2 (two) times  daily as needed.   hydrochlorothiazide 25 MG tablet Commonly known as:  HYDRODIURIL Take by mouth.   HYDROcodone-acetaminophen 5-325 MG tablet Commonly known as:  NORCO/VICODIN Take 1-2 tablets by mouth every 6 (six) hours as needed.   KLOR-CON M10 10 MEQ tablet Generic drug:  potassium chloride TAKE ONE TABLET BY MOUTH THREE TIMES  DAILY   levothyroxine 75 MCG tablet Commonly known as:  SYNTHROID, LEVOTHROID Take 75 mcg by mouth daily before breakfast.   liraglutide 18 MG/3ML Sopn Inject 0.6 mg into the skin daily.   loratadine 10 MG tablet Commonly known as:  CLARITIN Take by mouth.   magnesium oxide 400 MG tablet Commonly known as:  MAG-OX TAKE ONE TABLET BY MOUTH TWICE DAILY   metoCLOPramide 5 MG tablet Commonly known as:  REGLAN Take 5 mg by mouth 4 (four) times daily.   metolazone 2.5 MG tablet Commonly known as:  ZAROXOLYN Take by mouth.   metoprolol tartrate 50 MG tablet Commonly known as:  LOPRESSOR Take 50 mg by mouth 2 (two) times daily.   RABEprazole 20 MG tablet Commonly known as:  ACIPHEX Take by mouth.   ranitidine 150 MG tablet Commonly known as:  ZANTAC Take by mouth.   simvastatin 80 MG tablet Commonly known as:  ZOCOR Take 80 mg by mouth daily at 6 PM.   tiotropium 18 MCG inhalation capsule Commonly known as:  SPIRIVA Place 18 mcg into inhaler and inhale daily.   torsemide 20 MG tablet Commonly known as:  DEMADEX Take by mouth.   VENTOLIN HFA 108 (90 Base) MCG/ACT inhaler Generic drug:  albuterol Inhale 2 puffs into the lungs 4 (four) times daily.   Vitamin D3 2000 units capsule Take by mouth.       Allergies:  Allergies  Allergen Reactions  . Aspirin Nausea Only    Stomach burning  . Furosemide Palpitations  . Latex Itching, Rash and Other (See Comments)    SNEEZING  . Meloxicam Nausea Only  . Nsaids Nausea Only  . Tape Rash    Family History: Family History  Problem Relation Age of Onset  . Breast cancer  Paternal Aunt        80's  . Breast cancer Maternal Aunt 68  . Breast cancer Maternal Aunt 67  . Colon cancer Mother   . Lung cancer Father   . Prostate cancer Neg Hx   . Kidney cancer Neg Hx   . Bladder Cancer Neg Hx     Social History:  reports that she has quit smoking. Her smoking use included E-cigarettes. She has never used smokeless tobacco. She reports that she drinks alcohol. She reports that she does not use drugs.  ROS: UROLOGY Frequent Urination?: Yes Hard to postpone urination?: Yes Burning/pain with urination?: No Get up at night to urinate?: Yes Leakage of urine?: No Urine stream starts and stops?: No Trouble starting stream?: No Do you have to strain to urinate?: No Blood in urine?: No Urinary tract infection?: No Sexually transmitted disease?: No Injury to kidneys or bladder?: No Painful intercourse?: No Weak stream?: No Currently pregnant?: No Vaginal bleeding?: No Last menstrual period?: n  Gastrointestinal Nausea?: Yes Vomiting?: No Indigestion/heartburn?: Yes Diarrhea?: Yes Constipation?: Yes  Constitutional Fever: No Night sweats?: Yes Weight loss?: No Fatigue?: No  Skin Skin rash/lesions?: No Itching?: Yes  Eyes Blurred vision?: Yes Double vision?: No  Ears/Nose/Throat Sore throat?: No Sinus problems?: Yes  Hematologic/Lymphatic Swollen glands?: No Easy bruising?: No  Cardiovascular Leg swelling?: Yes Chest pain?: No  Respiratory Cough?: Yes Shortness of breath?: No  Endocrine Excessive thirst?: No  Musculoskeletal Back pain?: Yes Joint pain?: Yes  Neurological Headaches?: No Dizziness?: No  Psychologic Depression?: No Anxiety?: No   Physical Exam: BP 127/74   Pulse 82   Ht 5\' 8"  (1.727 m)   Wt (!) 362 lb (164.2  kg)   BMI 55.04 kg/m   Constitutional: Well nourished. Alert and oriented, No acute distress. HEENT: [place] AT, moist mucus membranes. Trachea midline, no masses. Cardiovascular: No clubbing,  cyanosis, or edema. Respiratory: Normal respiratory effort, no increased work of breathing. Skin: No rashes, bruises or suspicious lesions. Lymph: No cervical or inguinal adenopathy. Neurologic: Grossly intact, no focal deficits, moving all 4 extremities. Psychiatric: Normal mood and affect.  Laboratory Data: Lab Results  Component Value Date   WBC 8.9 07/01/2016   HGB 12.5 07/01/2016   HCT 37.6 07/01/2016   MCV 78.8 (L) 07/01/2016   PLT 264 07/01/2016    Lab Results  Component Value Date   CREATININE 1.18 (H) 07/01/2016    Lab Results  Component Value Date   HGBA1C 8.0 (H) 10/23/2012    Lab Results  Component Value Date   AST 28 07/01/2016   Lab Results  Component Value Date   ALT 23 07/01/2016    PTNS treatment: The needle electrode was inserted into the lower, inner aspect of the patient's left leg. The surface electrode was placed on the inside arch of the foot on the treatment leg. The lead set was connected to the stimulator and the needle electrode clip was connected to the needle electrode. The stimulator that produces an adjustable electrical pulse that travels to the sacral nerve plexus via the tibial nerve was increased to until the patient received a toe flex and a sensory response.     Assessment & Plan:   1. Incontinence  - Treatment Plan:  The needle electrode was removed without difficulty to the patient.  Patient tolerated the procedure for 30 minutes.  She will return next week for # 12 out of 12 of their weekly PTNS treatment's  Return in about 1 week (around 08/11/2016) for # 12/12 PTNS.  These notes generated with voice recognition software. I apologize for typographical errors.  Zara Council, [facility] [address], [place], [place] [address] [phone]

## 2016-08-04 ENCOUNTER — Ambulatory Visit: Payer: Medicare PPO

## 2016-08-04 ENCOUNTER — Encounter: Payer: Self-pay | Admitting: Urology

## 2016-08-04 ENCOUNTER — Ambulatory Visit (INDEPENDENT_AMBULATORY_CARE_PROVIDER_SITE_OTHER): Payer: Medicare PPO | Admitting: Urology

## 2016-08-04 VITALS — BP 127/74 | HR 82 | Ht 68.0 in | Wt 362.0 lb

## 2016-08-04 DIAGNOSIS — N3941 Urge incontinence: Secondary | ICD-10-CM | POA: Diagnosis not present

## 2016-08-04 LAB — PTNS-PERCUTANEOUS TIBIAL NERVE STIMULATION: Scan Result: 0

## 2016-08-04 NOTE — Progress Notes (Signed)
PTNS  Session # 11  Health & Social Factors: No Change Caffeine: 0-1 Alcohol: 0 Daytime voids #per day: 10 Night-time voids #per night: 4 Urgency: Strong Incontinence Episodes #per day: 3 Ankle used: Left Treatment Setting: 8 Feeling/ Response: Both  Preformed By: Zara Council PA-C  Assistant:Ramona Williams CMA  Follow Up: One week

## 2016-08-10 ENCOUNTER — Ambulatory Visit: Payer: Medicare PPO | Admitting: Urology

## 2016-08-10 ENCOUNTER — Encounter: Payer: Self-pay | Admitting: Urology

## 2016-08-10 VITALS — BP 129/80 | HR 90 | Ht 68.0 in | Wt 354.3 lb

## 2016-08-10 DIAGNOSIS — N3941 Urge incontinence: Secondary | ICD-10-CM

## 2016-08-10 LAB — PTNS-PERCUTANEOUS TIBIAL NERVE STIMULATION: Scan Result: 3

## 2016-08-10 NOTE — Progress Notes (Signed)
PTNS  Session # 12  Health & Social Factors: No Change Caffeine: 0-1 Alcohol: 0 Daytime voids #per day: 12 Night-time voids #per night: 5 Urgency: Strong Incontinence Episodes #per day: 8 Ankle used: Left Treatment Setting: 3 Feeling/ Response: Sensory  Preformed By: Festus Aloe MD  Assistant: Lyndee Hensen CMA  Follow Up: One Month

## 2016-08-10 NOTE — Progress Notes (Signed)
67/67/2094 7:09 PM   Teresa Holland Teresa Holland [birth year] [id number]  Referring provider: Rusty Aus, MD Teresa Holland Teresa Holland, Teresa Holland Teresa Holland  Chief Complaint  Patient presents with  . PTNS    Urge Incontinence    HPI: 66 yo AAF who presents today to start a 12 weekly course of PTNS treatments. #12/12 today.    Background history Patient is a 66 year old [place] American female who is referred to [place] by, Dr. Sabra Holland, for urinary incontinence who presents with her husband, Teresa Holland. Patient states that she has had urinary incontinence for the last three to four months. Patient has incontinence with urgency. She is experiencing Several incontinent episodes during the day. She is experiencing 4 incontinent episodes during the night.  Her incontinence volume is large. She is wearing greater than 10pads/depends daily. She is having associated urinary frequency, urgency, nocturia and intermittency. She does not have a history of urinary tract infections, STI's or injury to the bladder. She denies dysuria, gross hematuria, suprapubic pain, back pain, abdominal pain or flank pain. She has not had any recent fevers, chills, nausea or vomiting.   She does not have a history of nephrolithiasis, GU surgery or GU trauma.  She is sexually active. She has not noted incontinence with sexual intercourse. She is post menopausal. She admits to constipation.  She is not having pain with bladder filling. She has not had any recent imaging studies. She is drinking 64 ozof water daily. She is drinking an occasionalcaffeinated beverages daily. She is not drinking alcoholic beverages daily.   Her risk factors for incontinence are obesity, a family history of incontinence, age, caffeine, diabetes, depression, vaginal atrophy, pelvic surgery and nerve damage of the lumbar area. Sheis taking antihistamines, decongestants,diuretics and antidepressants.    Her UA was unremarkable. Her PVR was 26 mL.           Previous Therapy:     Behavioral Modification Techniques (discuss all modalities attempted with start/stop dates)            Pelvic Floor Exercises - patient deferred           Biofeedback - patient deferred           Bladder Training - advise patient to void every two hours while awake           Fluid Management - good water intake           Dietary Restrictions - NONE       Patient did not want to try medications for her incontinence due to gut transit issues.   Contraindications present for PTNS      Pacemaker - NO      Implantable defibrillator - NO      History of abnormal bleeding - NO      History of neuropathies or nerve damage - NO  Discussed with patient possible complications of procedure, such as discomfort, bleeding at insertion/stimulation site, procedure consent signed  Patient goals: To reduce the amount of incontinence episodes.    Today, she is complaining of frequency, urgency and nocturia.  She is not experiencing dysuria, gross hematuria or suprapubic pain. She denies any fevers, chills, nausea or vomiting.  Baseline symptoms consist of 10 daytime voids, 4-5 night time voids and 0 incontinence.  After her last treatment, she is experiencing day time voids 12 (worse), 5 night time voids (worse) and 8 incontinence episodes daily (stable).       PMH: Past  Medical History:  Diagnosis Date  . Anxiety   . Arthritis   . Asthma   . Back pain   . Breast cancer ([place]) 2016   right breast  . Cancer ([place])    breast  . CHF (congestive heart failure) ([place])   . Complication of anesthesia    heart issues 2004 during gb surgery had to be revived  . COPD (chronic obstructive pulmonary disease) ([place])   . Depression   . Diabetes mellitus without complication ([place])   . GERD (gastroesophageal reflux disease)   . Gout   . Headache   . Hypertension   . Hypothyroidism   . Obesity   . Pain    chronic back   . Shortness of breath dyspnea   . Sleep apnea    could not use cpap uses 1.5 l [place] at night    Surgical History: Past Surgical History:  Procedure Laterality Date  . abd pain    . ABDOMINAL HYSTERECTOMY    . ANKLE FRACTURE SURGERY    . BACK SURGERY     lumbar fusion  . BREAST BIOPSY Left 2004   negative  . CHOLECYSTECTOMY    . ESOPHAGOGASTRODUODENOSCOPY (EGD) WITH PROPOFOL N/A 02/06/2016   Procedure: ESOPHAGOGASTRODUODENOSCOPY (EGD) WITH PROPOFOL;  Surgeon: Teresa Sails, MD;  Location: [facility] ENDOSCOPY;  Service: Endoscopy;  Laterality: N/A;  . MASTECTOMY Right 2016  . OOPHORECTOMY     one ovary remaining  . PARTIAL MASTECTOMY WITH NEEDLE LOCALIZATION Right 09/20/2014   Procedure: PARTIAL MASTECTOMY WITH NEEDLE LOCALIZATION;  Surgeon: Leonie Green, MD;  Location: [facility] ORS;  Service: General;  Laterality: Right;  . SENTINEL NODE BIOPSY Right 09/20/2014   Procedure: SENTINEL NODE BIOPSY;  Surgeon: Leonie Green, MD;  Location: [facility] ORS;  Service: General;  Laterality: Right;    Home Medications:  Allergies as of 08/10/2016      Reactions   Aspirin Nausea Only   Stomach burning   Furosemide Palpitations   Latex Itching, Rash, Other (See Comments)   SNEEZING   Meloxicam Nausea Only   Nsaids Nausea Only   Tape Rash      Medication List       Accurate as of 08/10/16  4:09 PM. Always use your most recent med list.          calcium carbonate 600 MG Tabs tablet Commonly known as:  CALCIUM 600 Take 1 tablet (600 mg total) by mouth 2 (two) times daily with a meal.   dexlansoprazole 60 MG capsule Commonly known as:  DEXILANT Take 60 mg by mouth daily.   enalapril 20 MG tablet Commonly known as:  VASOTEC Take 20 mg by mouth every morning.   exemestane 25 MG tablet Commonly known as:  AROMASIN TAKE 1 TABLET BY MOUTH ONCE DAILY AFTER BREAKFAST   gabapentin 300 MG capsule Commonly known as:  NEURONTIN Take 300 mg by mouth 3 (three) times daily.    glimepiride 2 MG tablet Commonly known as:  AMARYL Take 2 mg by mouth daily with breakfast.   guaiFENesin 600 MG 12 hr tablet Commonly known as:  MUCINEX Take by mouth 2 (two) times daily as needed.   hydrochlorothiazide 25 MG tablet Commonly known as:  HYDRODIURIL Take by mouth.   HYDROcodone-acetaminophen 5-325 MG tablet Commonly known as:  NORCO/VICODIN Take 1-2 tablets by mouth every 6 (six) hours as needed.   KLOR-CON M10 10 MEQ tablet Generic drug:  potassium chloride TAKE ONE TABLET BY MOUTH THREE TIMES DAILY  levothyroxine 75 MCG tablet Commonly known as:  SYNTHROID, LEVOTHROID Take 75 mcg by mouth daily before breakfast.   liraglutide 18 MG/3ML Sopn Inject 0.6 mg into the skin daily.   loratadine 10 MG tablet Commonly known as:  CLARITIN Take by mouth.   magnesium oxide 400 MG tablet Commonly known as:  MAG-OX TAKE ONE TABLET BY MOUTH TWICE DAILY   metoCLOPramide 5 MG tablet Commonly known as:  REGLAN Take 5 mg by mouth 4 (four) times daily.   metolazone 2.5 MG tablet Commonly known as:  ZAROXOLYN Take by mouth.   metoprolol tartrate 50 MG tablet Commonly known as:  LOPRESSOR Take 50 mg by mouth 2 (two) times daily.   RABEprazole 20 MG tablet Commonly known as:  ACIPHEX Take by mouth.   ranitidine 150 MG tablet Commonly known as:  ZANTAC Take by mouth.   simvastatin 80 MG tablet Commonly known as:  ZOCOR Take 80 mg by mouth daily at 6 PM.   tiotropium 18 MCG inhalation capsule Commonly known as:  SPIRIVA Place 18 mcg into inhaler and inhale daily.   torsemide 20 MG tablet Commonly known as:  DEMADEX Take by mouth.   VENTOLIN HFA 108 (90 Base) MCG/ACT inhaler Generic drug:  albuterol Inhale 2 puffs into the lungs 4 (four) times daily.   Vitamin D3 2000 units capsule Take by mouth.       Allergies:  Allergies  Allergen Reactions  . Aspirin Nausea Only    Stomach burning  . Furosemide Palpitations  . Latex Itching, Rash and  Other (See Comments)    SNEEZING  . Meloxicam Nausea Only  . Nsaids Nausea Only  . Tape Rash    Family History: Family History  Problem Relation Age of Onset  . Breast cancer Paternal Aunt        73's  . Breast cancer Maternal Aunt 68  . Breast cancer Maternal Aunt 67  . Colon cancer Mother   . Lung cancer Father   . Prostate cancer Neg Hx   . Kidney cancer Neg Hx   . Bladder Cancer Neg Hx     Social History:  reports that she has quit smoking. Her smoking use included E-cigarettes. She has never used smokeless tobacco. She reports that she drinks alcohol. She reports that she does not use drugs.  ROS: UROLOGY Frequent Urination?: Yes Hard to postpone urination?: No Burning/pain with urination?: No Get up at night to urinate?: Yes Leakage of urine?: Yes Urine stream starts and stops?: No Trouble starting stream?: No Do you have to strain to urinate?: No Blood in urine?: No Urinary tract infection?: No Sexually transmitted disease?: No Injury to kidneys or bladder?: No Painful intercourse?: No Weak stream?: No Currently pregnant?: No Vaginal bleeding?: No Last menstrual period?: n  Gastrointestinal Nausea?: Yes Vomiting?: No Indigestion/heartburn?: Yes Diarrhea?: Yes Constipation?: Yes  Constitutional Fever: No Night sweats?: Yes Weight loss?: No Fatigue?: No  Skin Skin rash/lesions?: No Itching?: Yes  Eyes Blurred vision?: Yes Double vision?: No  Ears/Nose/Throat Sore throat?: No Sinus problems?: Yes  Hematologic/Lymphatic Swollen glands?: No Easy bruising?: No  Cardiovascular Leg swelling?: Yes Chest pain?: No  Respiratory Cough?: Yes Shortness of breath?: No  Endocrine Excessive thirst?: No  Musculoskeletal Back pain?: Yes Joint pain?: Yes  Neurological Headaches?: Yes Dizziness?: No  Psychologic Depression?: No Anxiety?: No  Physical Exam: BP 129/80   Pulse 90   Ht 5\' 8"  (1.727 m)   Wt (!) 160.7 kg (354 lb 4.8 oz)  BMI 53.87 kg/m   Constitutional:  Alert and oriented, No acute distress. HEENT: [place] AT, moist mucus membranes.  Trachea midline, no masses. Cardiovascular: No clubbing, cyanosis, or edema. Respiratory: Normal respiratory effort, no increased work of breathing. GI: Abdomen is soft, nontender, nondistended, no abdominal masses Skin: No rashes, bruises or suspicious lesions. Neurologic: Grossly intact, no focal deficits, moving all 4 extremities. Psychiatric: Normal mood and affect.  Laboratory Data: Lab Results  Component Value Date   WBC 8.9 07/01/2016   HGB 12.5 07/01/2016   HCT 37.6 07/01/2016   MCV 78.8 (L) 07/01/2016   PLT 264 07/01/2016    Lab Results  Component Value Date   CREATININE 1.18 (H) 07/01/2016    No results found for: PSA  No results found for: TESTOSTERONE  Lab Results  Component Value Date   HGBA1C 8.0 (H) 10/23/2012    Urinalysis    Component Value Date/Time   COLORURINE Yellow 08/25/2012 2020   APPEARANCEUR Cloudy (A) 04/13/2016 1057   LABSPEC 1.018 08/25/2012 2020   PHURINE 6.0 08/25/2012 2020   GLUCOSEU Negative 04/13/2016 1057   GLUCOSEU Negative 08/25/2012 2020   HGBUR 1+ 08/25/2012 2020   BILIRUBINUR Negative 04/13/2016 1057   BILIRUBINUR Negative 08/25/2012 2020   KETONESUR Negative 08/25/2012 2020   PROTEINUR Negative 04/13/2016 1057   PROTEINUR 100 mg/dL 08/25/2012 2020   NITRITE Negative 04/13/2016 1057   NITRITE Negative 08/25/2012 2020   LEUKOCYTESUR Trace (A) 04/13/2016 1057   LEUKOCYTESUR 3+ 08/25/2012 2020     Assessment & Plan:   1. Urge incontinence Her symptoms Have recently worsened after an initial benefit. She would like to consider surveillance if approved and we will look into that. F/u 1 month for maintenance.   - PTNS-Percutaneous Tibial Nerve Stimulati   No Follow-up on file.  Festus Aloe, [facility] [address], [place], [place] [address] [phone]

## 2016-08-18 ENCOUNTER — Telehealth: Payer: Self-pay | Admitting: *Deleted

## 2016-08-18 NOTE — Telephone Encounter (Signed)
LMOM for patient to call office back. I need to talk to patient regarding her maintenance PTNS.

## 2016-08-18 NOTE — Telephone Encounter (Signed)
Spoke with patient husband and I was explaining that her insurance was likely to not approve more units of PTNS because of low symptom improvement. He states something is funny with her insurance right now and he is going to have to make a lot of calls and medication problems that he has to fix was wondering if that had anything to do with it I let him know I did not know patient states can I let him call and get stuff straight and then retry again. I let him know that I would that he just needs to call me and let me know when he gets the insurance straight. Patient husband states he will call me.

## 2016-08-30 ENCOUNTER — Ambulatory Visit
Admission: RE | Admit: 2016-08-30 | Discharge: 2016-08-30 | Disposition: A | Discharge status: Home / Self Care | Payer: Medicare PPO | Source: Ambulatory Visit | Attending: Hematology and Oncology | Admitting: Hematology and Oncology

## 2016-08-30 DIAGNOSIS — R718 Other abnormality of red blood cells: Secondary | ICD-10-CM | POA: Insufficient documentation

## 2016-08-30 DIAGNOSIS — C50511 Malignant neoplasm of lower-outer quadrant of right female breast: Secondary | ICD-10-CM | POA: Insufficient documentation

## 2016-09-10 ENCOUNTER — Telehealth: Payer: Self-pay | Admitting: *Deleted

## 2016-09-10 NOTE — Telephone Encounter (Signed)
Patient called back and I let her know that her PTNS monthly units are still under clinical review and I have not received the approval and I knew her appointment was Tuesday next week. I let her know that we would leave her appointment as is and if I don't have it by Monday evening we will have to reschedule her appointment. Patient ok with plan.

## 2016-09-10 NOTE — Telephone Encounter (Signed)
LMOM for patient to call office back about PTNS procedure.

## 2016-09-13 ENCOUNTER — Telehealth: Payer: Self-pay | Admitting: *Deleted

## 2016-09-13 NOTE — Telephone Encounter (Signed)
Spoke with patient and let her know I still have not received the authorization to proceed with monthly PTNS appointments. We will have to cancel tomorrows appointment and I will call her back when I receive the authorization and get her another appointment.  Patient ok with plan.

## 2016-09-14 ENCOUNTER — Ambulatory Visit: Payer: Medicare PPO | Admitting: Urology

## 2016-09-20 ENCOUNTER — Other Ambulatory Visit: Payer: Self-pay | Admitting: *Deleted

## 2016-09-20 MED ORDER — EXEMESTANE 25 MG PO TABS: ORAL_TABLET | ORAL | 0 refills | Status: DC

## 2016-09-21 ENCOUNTER — Telehealth: Payer: Self-pay | Admitting: *Deleted

## 2016-09-21 NOTE — Telephone Encounter (Signed)
Received patient's authorization for monthly PTNS but it will expire the end of December so we will need to get another authorization if marked improvement to go forward after December. LMOM for patient to call back and make an appointment for her PTNS. Not on Wednesday.

## 2016-09-22 NOTE — Telephone Encounter (Signed)
Patient called back yesterday to make the appointment. Angie took care of this for her.

## 2016-10-12 ENCOUNTER — Ambulatory Visit: Payer: Medicare PPO | Admitting: Urology

## 2016-10-18 NOTE — Progress Notes (Deleted)
Chief Complaint:  No chief complaint on file.    HPI: 66 yo AAF who presents today to start her maintenance PTNS.    Background history Patient is a 10 -year-old [place] American female who is referred to [place] by, Dr. Sabra Heck, for urinary incontinence who presents with her husband, [name].  Patient states that she has had urinary incontinence for the last three to four months.  Patient has incontinence with urgency.   She is experiencing  Several incontinent episodes during the day. She is experiencing 4 incontinent episodes during the night.  Her incontinence volume is large.   She is wearing greater than 10 pads/depends daily.  She is having associated urinary frequency, urgency, nocturia and intermittency.  She does not have a history of urinary tract infections, STI's or injury to the bladder.   She denies dysuria, gross hematuria, suprapubic pain, back pain, abdominal pain or flank pain.  She has not had any recent fevers, chills, nausea or vomiting.   She does not have a history of nephrolithiasis, GU surgery or GU trauma.  She is sexually active.  She has not noted incontinence with sexual intercourse.  She is post menopausal.   She admits to constipation.  She is not having pain with bladder filling.  She has not had any recent imaging studies.  She is drinking 64 oz of water daily.   She is drinking an occasional caffeinated beverages daily.  She is not drinking alcoholic beverages daily.    Her risk factors for incontinence are obesity, a family history of incontinence, age, caffeine, diabetes, depression, vaginal atrophy, pelvic surgery and nerve damage of the lumbar area.  She is taking antihistamines, decongestants, diuretics and antidepressants.    Her UA was unremarkable.  Her PVR was 26 mL.            Previous Therapy:     Behavioral Modification Techniques (discuss all modalities attempted with start/stop dates)            Pelvic Floor Exercises - patient deferred  Biofeedback - patient deferred           Bladder Training - advise patient to void every two hours while awake           Fluid Management - good water intake           Dietary Restrictions - NONE       Patient did not want to try medications for her incontinence due to gut transit issues.   Contraindications present for PTNS      Pacemaker - NO      Implantable defibrillator - NO      History of abnormal bleeding - NO      History of neuropathies or nerve damage - NO  Discussed with patient possible complications of procedure, such as discomfort, bleeding at insertion/stimulation site, procedure consent signed  Patient goals: To reduce the amount of incontinence episodes.    Today, she is complaining of frequency, urgency and nocturia.  She is not experiencing dysuria, gross hematuria or suprapubic pain. She denies any fevers, chills, nausea or vomiting.  Baseline symptoms consist of 10 daytime voids, 4-5 night time voids and 0 incontinence.  After her last treatment, she is experiencing day time voids 10 (worse), 4 night time voids (stable) and 3 incontinence episodes daily (stable).    PMH: Past Medical History:  Diagnosis Date  . Anxiety   . Arthritis   . Asthma   . Back pain   .  Breast cancer ([place]) 2016   right breast  . Cancer ([place])    breast  . CHF (congestive heart failure) ([place])   . Complication of anesthesia    heart issues 2004 during gb surgery had to be revived  . COPD (chronic obstructive pulmonary disease) ([place])   . Depression   . Diabetes mellitus without complication ([place])   . GERD (gastroesophageal reflux disease)   . Gout   . Headache   . Hypertension   . Hypothyroidism   . Obesity   . Pain    chronic back  . Shortness of breath dyspnea   . Sleep apnea    could not use cpap uses 1.5 l [place] at night    Surgical History: Past Surgical History:  Procedure Laterality Date  . abd pain    . ABDOMINAL HYSTERECTOMY    . ANKLE FRACTURE SURGERY    . BACK  SURGERY     lumbar fusion  . BREAST BIOPSY Left 2004   negative  . CHOLECYSTECTOMY    . ESOPHAGOGASTRODUODENOSCOPY (EGD) WITH PROPOFOL N/A 02/06/2016   Procedure: ESOPHAGOGASTRODUODENOSCOPY (EGD) WITH PROPOFOL;  Surgeon: Lollie Sails, MD;  Location[facility] ENDOSCOPY;  Service: Endoscopy;  Laterality: N/A;  . MASTECTOMY Right 2016  . OOPHORECTOMY     one ovary remaining  . PARTIAL MASTECTOMY WITH NEEDLE LOCALIZATION Right 09/20/2014   Procedure: PARTIAL MASTECTOMY WITH NEEDLE LOCALIZATION;  Surgeon: Leonie Green, MD;  Location: [facility] ORS;  Service: General;  Laterality: Right;  . SENTINEL NODE BIOPSY Right 09/20/2014   Procedure: SENTINEL NODE BIOPSY;  Surgeon: Leonie Green, MD;  Location: [facility] ORS;  Service: General;  Laterality: Right;    Home Medications:  Allergies as of 10/19/2016      Reactions   Aspirin Nausea Only   Stomach burning   Furosemide Palpitations   Latex Itching, Rash, Other (See Comments)   SNEEZING   Meloxicam Nausea Only   Nsaids Nausea Only   Tape Rash      Medication List       Accurate as of 10/18/16 10:37 PM. Always use your most recent med list.          calcium carbonate 600 MG Tabs tablet Commonly known as:  CALCIUM 600 Take 1 tablet (600 mg total) by mouth 2 (two) times daily with a meal.   dexlansoprazole 60 MG capsule Commonly known as:  DEXILANT Take 60 mg by mouth daily.   enalapril 20 MG tablet Commonly known as:  VASOTEC Take 20 mg by mouth every morning.   exemestane 25 MG tablet Commonly known as:  AROMASIN TAKE 1 TABLET BY MOUTH ONCE DAILY AFTER BREAKFAST   gabapentin 300 MG capsule Commonly known as:  NEURONTIN Take 300 mg by mouth 3 (three) times daily.   glimepiride 2 MG tablet Commonly known as:  AMARYL Take 2 mg by mouth daily with breakfast.   guaiFENesin 600 MG 12 hr tablet Commonly known as:  MUCINEX Take by mouth 2 (two) times daily as needed.   hydrochlorothiazide 25 MG tablet Commonly known  as:  HYDRODIURIL Take by mouth.   HYDROcodone-acetaminophen 5-325 MG tablet Commonly known as:  NORCO/VICODIN Take 1-2 tablets by mouth every 6 (six) hours as needed.   KLOR-CON M10 10 MEQ tablet Generic drug:  potassium chloride TAKE ONE TABLET BY MOUTH THREE TIMES DAILY   levothyroxine 75 MCG tablet Commonly known as:  SYNTHROID, LEVOTHROID Take 75 mcg by mouth daily before breakfast.   liraglutide 18 MG/3ML  Sopn Inject 0.6 mg into the skin daily.   loratadine 10 MG tablet Commonly known as:  CLARITIN Take by mouth.   magnesium oxide 400 MG tablet Commonly known as:  MAG-OX TAKE ONE TABLET BY MOUTH TWICE DAILY   metoCLOPramide 5 MG tablet Commonly known as:  REGLAN Take 5 mg by mouth 4 (four) times daily.   metolazone 2.5 MG tablet Commonly known as:  ZAROXOLYN Take by mouth.   metoprolol tartrate 50 MG tablet Commonly known as:  LOPRESSOR Take 50 mg by mouth 2 (two) times daily.   RABEprazole 20 MG tablet Commonly known as:  ACIPHEX Take by mouth.   ranitidine 150 MG tablet Commonly known as:  ZANTAC Take by mouth.   simvastatin 80 MG tablet Commonly known as:  ZOCOR Take 80 mg by mouth daily at 6 PM.   tiotropium 18 MCG inhalation capsule Commonly known as:  SPIRIVA Place 18 mcg into inhaler and inhale daily.   torsemide 20 MG tablet Commonly known as:  DEMADEX Take by mouth.   VENTOLIN HFA 108 (90 Base) MCG/ACT inhaler Generic drug:  albuterol Inhale 2 puffs into the lungs 4 (four) times daily.   Vitamin D3 2000 units capsule Take by mouth.       Allergies:  Allergies  Allergen Reactions  . Aspirin Nausea Only    Stomach burning  . Furosemide Palpitations  . Latex Itching, Rash and Other (See Comments)    SNEEZING  . Meloxicam Nausea Only  . Nsaids Nausea Only  . Tape Rash    Family History: Family History  Problem Relation Age of Onset  . Breast cancer Paternal Aunt        67's  . Breast cancer Maternal Aunt 68  . Breast  cancer Maternal Aunt 67  . Colon cancer Mother   . Lung cancer Father   . Prostate cancer Neg Hx   . Kidney cancer Neg Hx   . Bladder Cancer Neg Hx     Social History:  reports that she has quit smoking. Her smoking use included E-cigarettes. She has never used smokeless tobacco. She reports that she drinks alcohol. She reports that she does not use drugs.  ROS:                                         Physical Exam: There were no vitals taken for this visit.  Constitutional: Well nourished. Alert and oriented, No acute distress. HEENT: [place] AT, moist mucus membranes. Trachea midline, no masses. Cardiovascular: No clubbing, cyanosis, or edema. Respiratory: Normal respiratory effort, no increased work of breathing. Skin: No rashes, bruises or suspicious lesions. Lymph: No cervical or inguinal adenopathy. Neurologic: Grossly intact, no focal deficits, moving all 4 extremities. Psychiatric: Normal mood and affect.  Laboratory Data: Lab Results  Component Value Date   WBC 8.9 07/01/2016   HGB 12.5 07/01/2016   HCT 37.6 07/01/2016   MCV 78.8 (L) 07/01/2016   PLT 264 07/01/2016    Lab Results  Component Value Date   CREATININE 1.18 (H) 07/01/2016    Lab Results  Component Value Date   AST 28 07/01/2016   Lab Results  Component Value Date   ALT 23 07/01/2016   I have reviewed the labs  PTNS treatment: The needle electrode was inserted into the lower, inner aspect of the patient's left leg. The surface electrode was placed on the  inside arch of the foot on the treatment leg. The lead set was connected to the stimulator and the needle electrode clip was connected to the needle electrode. The stimulator that produces an adjustable electrical pulse that travels to the sacral nerve plexus via the tibial nerve was increased to until the patient received a toe flex and a sensory response.     Assessment & Plan:   1. Incontinence  - Treatment Plan:    The needle electrode was removed without difficulty to the patient.  Patient tolerated the procedure for 30 minutes.  She will return next month for maintainence PTNS treatment's  No Follow-up on file.  These notes generated with voice recognition software. I apologize for typographical errors.  Zara Council, [facility] [address] [place], [place] [address] [phone]

## 2016-10-19 ENCOUNTER — Ambulatory Visit: Payer: Medicare PPO | Admitting: Urology

## 2016-11-12 ENCOUNTER — Telehealth: Payer: Self-pay | Admitting: Urgent Care

## 2016-11-12 NOTE — Telephone Encounter (Signed)
Oral Oncology Patient Advocate Encounter    Called [facility] to let her know that [facility] was trying to get in touch with her. She will try to call them.  I told her they will not deliver her medication without speaking with her first. To expect a call from them each month.   [place] Patient Advocate [phone] 11/12/2016 2:54 PM

## 2016-11-12 NOTE — Telephone Encounter (Signed)
[employer] called to advise that patient has been approved for financial assistance. They have been trying to contact patient to schedule medication delivery, however they have been unable to contact. Spoke with [employer] here in the office. She will follow up with patient so that we can get patient started on therapy. Dr. Mike Gip made aware.

## 2016-12-07 DIAGNOSIS — D369 Benign neoplasm, unspecified site: Secondary | ICD-10-CM | POA: Insufficient documentation

## 2016-12-14 ENCOUNTER — Telehealth: Payer: Self-pay | Admitting: *Deleted

## 2016-12-14 ENCOUNTER — Other Ambulatory Visit: Payer: Self-pay | Admitting: Urgent Care

## 2016-12-14 DIAGNOSIS — C50911 Malignant neoplasm of unspecified site of right female breast: Secondary | ICD-10-CM

## 2016-12-14 NOTE — Telephone Encounter (Signed)
The order has been placed. I put it in for 01/14/2017. Scheduling will call the patient once the appointment is made with mammography.  Thanks,  Gaspar Bidding

## 2016-12-14 NOTE — Telephone Encounter (Signed)
Patient requests an order for her mammo be placed so she can schedule her mammo in November

## 2017-01-04 ENCOUNTER — Other Ambulatory Visit: Payer: Medicare PPO

## 2017-01-04 ENCOUNTER — Ambulatory Visit: Payer: Medicare PPO | Admitting: Hematology and Oncology

## 2017-01-12 ENCOUNTER — Telehealth: Payer: Self-pay | Admitting: Hematology and Oncology

## 2017-01-12 NOTE — Telephone Encounter (Signed)
Oral Oncology Patient Advocate Encounter  Did re-enrollment for patients Aromasin thru [employer].  Will fax to [place] Patient Advocate [phone] 01/12/2017 12:36 PM

## 2017-01-17 ENCOUNTER — Other Ambulatory Visit: Payer: Self-pay | Admitting: *Deleted

## 2017-01-17 ENCOUNTER — Ambulatory Visit
Admission: RE | Admit: 2017-01-17 | Discharge: 2017-01-17 | Disposition: A | Discharge status: Home / Self Care | Payer: Medicare PPO | Source: Ambulatory Visit | Attending: Urgent Care | Admitting: Urgent Care

## 2017-01-17 DIAGNOSIS — C50911 Malignant neoplasm of unspecified site of right female breast: Secondary | ICD-10-CM | POA: Diagnosis present

## 2017-01-17 DIAGNOSIS — Z9889 Other specified postprocedural states: Secondary | ICD-10-CM | POA: Insufficient documentation

## 2017-01-18 ENCOUNTER — Inpatient Hospital Stay (HOSPITAL_BASED_OUTPATIENT_CLINIC_OR_DEPARTMENT_OTHER): Payer: Medicare PPO | Admitting: Hematology and Oncology

## 2017-01-18 ENCOUNTER — Other Ambulatory Visit: Payer: Self-pay

## 2017-01-18 ENCOUNTER — Other Ambulatory Visit: Payer: Self-pay | Admitting: *Deleted

## 2017-01-18 ENCOUNTER — Inpatient Hospital Stay: Payer: Medicare PPO | Attending: Hematology and Oncology

## 2017-01-18 ENCOUNTER — Encounter: Payer: Self-pay | Admitting: Hematology and Oncology

## 2017-01-18 VITALS — BP 146/83 | HR 61 | Temp 97.8°F | Wt 361.1 lb

## 2017-01-18 DIAGNOSIS — Z803 Family history of malignant neoplasm of breast: Secondary | ICD-10-CM | POA: Insufficient documentation

## 2017-01-18 DIAGNOSIS — G4730 Sleep apnea, unspecified: Secondary | ICD-10-CM | POA: Insufficient documentation

## 2017-01-18 DIAGNOSIS — F419 Anxiety disorder, unspecified: Secondary | ICD-10-CM | POA: Insufficient documentation

## 2017-01-18 DIAGNOSIS — Z79811 Long term (current) use of aromatase inhibitors: Secondary | ICD-10-CM

## 2017-01-18 DIAGNOSIS — K219 Gastro-esophageal reflux disease without esophagitis: Secondary | ICD-10-CM

## 2017-01-18 DIAGNOSIS — E039 Hypothyroidism, unspecified: Secondary | ICD-10-CM

## 2017-01-18 DIAGNOSIS — Z170 Estrogen receptor positive status [ER+]: Secondary | ICD-10-CM

## 2017-01-18 DIAGNOSIS — J449 Chronic obstructive pulmonary disease, unspecified: Secondary | ICD-10-CM

## 2017-01-18 DIAGNOSIS — E669 Obesity, unspecified: Secondary | ICD-10-CM

## 2017-01-18 DIAGNOSIS — I10 Essential (primary) hypertension: Secondary | ICD-10-CM | POA: Insufficient documentation

## 2017-01-18 DIAGNOSIS — F329 Major depressive disorder, single episode, unspecified: Secondary | ICD-10-CM

## 2017-01-18 DIAGNOSIS — Z79899 Other long term (current) drug therapy: Secondary | ICD-10-CM | POA: Insufficient documentation

## 2017-01-18 DIAGNOSIS — M109 Gout, unspecified: Secondary | ICD-10-CM

## 2017-01-18 DIAGNOSIS — J45909 Unspecified asthma, uncomplicated: Secondary | ICD-10-CM

## 2017-01-18 DIAGNOSIS — Z800 Family history of malignant neoplasm of digestive organs: Secondary | ICD-10-CM

## 2017-01-18 DIAGNOSIS — C50911 Malignant neoplasm of unspecified site of right female breast: Secondary | ICD-10-CM

## 2017-01-18 DIAGNOSIS — E119 Type 2 diabetes mellitus without complications: Secondary | ICD-10-CM | POA: Diagnosis not present

## 2017-01-18 DIAGNOSIS — Z801 Family history of malignant neoplasm of trachea, bronchus and lung: Secondary | ICD-10-CM | POA: Insufficient documentation

## 2017-01-18 DIAGNOSIS — M129 Arthropathy, unspecified: Secondary | ICD-10-CM | POA: Insufficient documentation

## 2017-01-18 DIAGNOSIS — Z87891 Personal history of nicotine dependence: Secondary | ICD-10-CM

## 2017-01-18 DIAGNOSIS — H538 Other visual disturbances: Secondary | ICD-10-CM | POA: Diagnosis not present

## 2017-01-18 DIAGNOSIS — I509 Heart failure, unspecified: Secondary | ICD-10-CM | POA: Diagnosis not present

## 2017-01-18 DIAGNOSIS — M549 Dorsalgia, unspecified: Secondary | ICD-10-CM

## 2017-01-18 DIAGNOSIS — Z9011 Acquired absence of right breast and nipple: Secondary | ICD-10-CM | POA: Insufficient documentation

## 2017-01-18 LAB — CBC WITH DIFFERENTIAL/PLATELET
Basophils Absolute: 0 10*3/uL (ref 0–0.1)
Basophils Relative: 0 %
Eosinophils Absolute: 0.1 10*3/uL (ref 0–0.7)
Eosinophils Relative: 1 %
HCT: 37.5 % (ref 35.0–47.0)
Hemoglobin: 12 g/dL (ref 12.0–16.0)
Lymphocytes Relative: 25 %
Lymphs Abs: 2.6 10*3/uL (ref 1.0–3.6)
MCH: 25.7 pg — ABNORMAL LOW (ref 26.0–34.0)
MCHC: 32 g/dL (ref 32.0–36.0)
MCV: 80.3 fL (ref 80.0–100.0)
Monocytes Absolute: 0.7 10*3/uL (ref 0.2–0.9)
Monocytes Relative: 7 %
Neutro Abs: 6.9 10*3/uL — ABNORMAL HIGH (ref 1.4–6.5)
Neutrophils Relative %: 67 %
Platelets: 260 10*3/uL (ref 150–440)
RBC: 4.66 MIL/uL (ref 3.80–5.20)
RDW: 15 % — ABNORMAL HIGH (ref 11.5–14.5)
WBC: 10.3 10*3/uL (ref 3.6–11.0)

## 2017-01-18 LAB — COMPREHENSIVE METABOLIC PANEL
ALT: 24 U/L (ref 14–54)
AST: 21 U/L (ref 15–41)
Albumin: 4.1 g/dL (ref 3.5–5.0)
Alkaline Phosphatase: 71 U/L (ref 38–126)
Anion gap: 9 (ref 5–15)
BUN: 14 mg/dL (ref 6–20)
CO2: 31 mmol/L (ref 22–32)
Calcium: 9.3 mg/dL (ref 8.9–10.3)
Chloride: 98 mmol/L — ABNORMAL LOW (ref 101–111)
Creatinine, Ser: 1.21 mg/dL — ABNORMAL HIGH (ref 0.44–1.00)
GFR calc Af Amer: 54 mL/min — ABNORMAL LOW (ref >60)
GFR calc non Af Amer: 46 mL/min — ABNORMAL LOW (ref >60)
Glucose, Bld: 114 mg/dL — ABNORMAL HIGH (ref 65–99)
Potassium: 3.9 mmol/L (ref 3.5–5.1)
Sodium: 138 mmol/L (ref 135–145)
Total Bilirubin: 0.8 mg/dL (ref 0.3–1.2)
Total Protein: 7.9 g/dL (ref 6.5–8.1)

## 2017-01-18 NOTE — Progress Notes (Signed)
Patient offers no complaints today. 

## 2017-01-18 NOTE — Progress Notes (Signed)
[facility] day:  01/18/2017  Chief Complaint: Teresa Holland is a 66 y.o. female with stage I right breast cancer who is seen for 6 month assessment.  HPI:  The patient was last seen in the medical oncology clinic on 07/01/2016.  At that time, she was seen for initial assessment.  She noted breast pain in the cold weather. She was limited and could  only walk short distances.  She had microcytic RBC indices.  Exam revealed no evidence of recurrent disease.  She continued Aromasin.  Labs at last visit included a hematocrit of 37.6, hemoglobin 12.5, MCV 78.8, platelets 264,000, white count 8900 with an [place] of 6400. Creatinine was 1.18. Liver function tests were normal.  Ferritin was 133, iron saturation 15% and TIBC 337.  CA 27.29 was 28.3  Bone density on 08/30/2016 was normal with a T-score of 0.1 in the left femur.  Bilateral diagnostic mammogram on 01/17/2017 revealed no evidence of malignancy in either breast. There were stable lumpectomy changes in the right breast.  During the interim, patient has been doing well. She notes that the previously reported temperature induced breast pain has resolved. She has no breast concerns. Patient has experienced no B symptoms or interval infections. She continues to have chronic blurred vision and back pain. Patient is eating well, with no significant weight loss demonstrated. Patient continues on the prescribed Aromasin with no perceived sie effects.   Patient able to independently complete all of her activities of daily living without difficulties.   Patient completes monthly self breast examinations at home. She has follow up colonoscopy scheduled in 2019 with [facility].    Past Medical History:  Diagnosis Date  . Anxiety   . Arthritis   . Asthma   . Back pain   . Breast cancer ([name]) 2016   right breast  . Cancer ([name])    breast  . CHF (congestive heart failure) ([name])   . Complication of  anesthesia    heart issues 2004 during gb surgery had to be revived  . COPD (chronic obstructive pulmonary disease) ([name])   . Depression   . Diabetes mellitus without complication ([name])   . GERD (gastroesophageal reflux disease)   . Gout   . Headache   . Hypertension   . Hypothyroidism   . Obesity   . Pain    chronic back  . Shortness of breath dyspnea   . Sleep apnea    could not use cpap uses 1.5 l [place] at night    Past Surgical History:  Procedure Laterality Date  . abd pain    . ABDOMINAL HYSTERECTOMY    . ANKLE FRACTURE SURGERY    . BACK SURGERY     lumbar fusion  . BREAST BIOPSY Left 2004   negative  . CHOLECYSTECTOMY    . ESOPHAGOGASTRODUODENOSCOPY (EGD) WITH PROPOFOL N/A 02/06/2016   Procedure: ESOPHAGOGASTRODUODENOSCOPY (EGD) WITH PROPOFOL;  Surgeon: Lollie Sails, MD;  Location[name] ENDOSCOPY;  Service: Endoscopy;  Laterality: N/A;  . MASTECTOMY Right 2016  . OOPHORECTOMY     one ovary remaining  . PARTIAL MASTECTOMY WITH NEEDLE LOCALIZATION Right 09/20/2014   Procedure: PARTIAL MASTECTOMY WITH NEEDLE LOCALIZATION;  Surgeon: Leonie Green, MD;  Location: [facility] ORS;  Service: General;  Laterality: Right;  . SENTINEL NODE BIOPSY Right 09/20/2014   Procedure: SENTINEL NODE BIOPSY;  Surgeon: Leonie Green, MD;  Location: [facility] ORS;  Service: General;  Laterality: Right;  Family History  Problem Relation Age of Onset  . Breast cancer Paternal Aunt        15's  . Breast cancer Maternal Aunt 68  . Breast cancer Maternal Aunt 67  . Colon cancer Mother   . Lung cancer Father   . Prostate cancer Neg Hx   . Kidney cancer Neg Hx   . Bladder Cancer Neg Hx     Social History:  reports that she has quit smoking. Her smoking use included e-cigarettes. she has never used smokeless tobacco. She reports that she drinks alcohol. She reports that she does not use drugs.  She drinks alcohol rarely.  She is disabled secondary to her back.  She previously worked with  developmentally disabled adults for 28 years.  She lives in [place].  The patient is accompanied by her husband, [name], today.  Allergies:  Allergies  Allergen Reactions  . Aspirin Nausea Only    Stomach burning  . Furosemide Palpitations  . Latex Itching, Rash and Other (See Comments)    SNEEZING  . Meloxicam Nausea Only  . Nsaids Nausea Only  . Tape Rash    Current Medications: Current Outpatient Medications  Medication Sig Dispense Refill  . albuterol (VENTOLIN HFA) 108 (90 BASE) MCG/ACT inhaler Inhale 2 puffs into the lungs 4 (four) times daily.     . calcium carbonate (CALCIUM 600) 600 MG TABS tablet Take 1 tablet (600 mg total) by mouth 2 (two) times daily with a meal. 60 tablet 6  . Cholecalciferol (VITAMIN D3) 2000 UNITS capsule Take by mouth.    . enalapril (VASOTEC) 20 MG tablet Take 20 mg by mouth every morning.     Marland Kitchen exemestane (AROMASIN) 25 MG tablet TAKE 1 TABLET BY MOUTH ONCE DAILY AFTER BREAKFAST 90 tablet 0  . gabapentin (NEURONTIN) 300 MG capsule Take 300 mg by mouth 3 (three) times daily.    Marland Kitchen glimepiride (AMARYL) 2 MG tablet Take 2 mg by mouth daily with breakfast.     . guaiFENesin (MUCINEX) 600 MG 12 hr tablet Take by mouth 2 (two) times daily as needed.    . hydrochlorothiazide (HYDRODIURIL) 25 MG tablet Take by mouth.    Marland Kitchen HYDROcodone-acetaminophen (NORCO/VICODIN) 5-325 MG per tablet Take 1-2 tablets by mouth every 6 (six) hours as needed.     Marland Kitchen levothyroxine (SYNTHROID, LEVOTHROID) 75 MCG tablet Take 75 mcg by mouth daily before breakfast.     . liraglutide 18 MG/3ML SOPN Inject 0.6 mg into the skin daily.    Marland Kitchen loratadine (CLARITIN) 10 MG tablet Take by mouth.    . magnesium oxide (MAG-OX) 400 MG tablet TAKE ONE TABLET BY MOUTH TWICE DAILY    . metoCLOPramide (REGLAN) 5 MG tablet Take 5 mg by mouth 4 (four) times daily.    . metoprolol (LOPRESSOR) 50 MG tablet Take 50 mg by mouth 2 (two) times daily.     . simvastatin (ZOCOR) 80 MG tablet Take 80 mg by  mouth daily at 6 PM.     . dexlansoprazole (DEXILANT) 60 MG capsule Take 60 mg by mouth daily.    . metolazone (ZAROXOLYN) 2.5 MG tablet Take by mouth.    . potassium chloride (KLOR-CON M10) 10 MEQ tablet TAKE ONE TABLET BY MOUTH THREE TIMES DAILY    . RABEprazole (ACIPHEX) 20 MG tablet Take by mouth.    . ranitidine (ZANTAC) 150 MG tablet Take by mouth.    . torsemide (DEMADEX) 20 MG tablet Take by mouth.  No current facility-administered medications for this visit.     Review of Systems:  GENERAL:  Feels "well".  No fevers or sweats.  Weight gain of 7 pounds.  PERFORMANCE STATUS (ECOG):  2 HEENT:  Blurred vision, chronic.  No runny nose, sore throat, mouth sores or tenderness. Lungs: No shortness of breath.  Intermittent cough.  No hemoptysis. Cardiac:  No chest pain, palpitations, orthopnea, or PND. GI:  No nausea, vomiting, diarrhea, constipation, melena or hematochezia.  EGD in 01/2016.  Colonoscopy planned for 2019. GU:  No urgency, frequency, dysuria, or hematuria. "Bladder reboot".  Control issue. Musculoskeletal:  Chronic back issues with radiation down into right leg.  No joint pain.  No muscle tenderness. Extremities:  No pain or swelling. Skin:  No rashes or skin changes. Neuro:  No headache, numbness or weakness, balance or coordination issues. Endocrine:  No diabetes, thyroid issues, hot flashes.  Night sweats at times. Psych:  No mood changes, depression or anxiety. Pain:  No focal pain. Review of systems:  All other systems reviewed and found to be negative.  Physical Exam: Blood pressure (!) 146/83, pulse 61, temperature 97.8 F (36.6 C), temperature source Tympanic, weight (!) 361 lb 2 oz (163.8 kg). GENERAL:  Well developed, well nourished, overweight woman sitting comfortably in a wheelchair in the exam room in no acute distress.  She requires 2 person assist onto table. MENTAL STATUS:  Alert and oriented to person, place and time. HEAD:  Curly short black  hair.  Normocephalic, atraumatic, face symmetric, no Cushingoid features. EYES:  Brown eyes.  Pupils equal round and reactive to light and accomodation.  No conjunctivitis or scleral icterus. ENT:  Oropharynx clear without lesion.  Tongue normal.  Dentures.  Mucous membranes moist.  RESPIRATORY:  Clear to auscultation without rales, wheezes or rhonchi. CARDIOVASCULAR:  Regular rate and rhythm without murmur, rub or gallop. BREAST:  Large breasts.  Right breast without masses, skin changes or nipple discharge.  Left breast without masses, skin changes or nipple discharge.  ABDOMEN:  Soft, non-tender, with active bowel sounds, and no appreciable hepatosplenomegaly.  No masses. SKIN:  Powder under breasts.  No rashes, ulcers or lesions. EXTREMITIES: Chronic lower extremity changes.  No skin discoloration or tenderness.  No palpable cords. LYMPH NODES: No palpable cervical, supraclavicular, axillary or inguinal adenopathy  NEUROLOGICAL: Unremarkable. PSYCH:  Appropriate.   Orders Only on 01/18/2017  Component Date Value Ref Range Status  . Sodium 01/18/2017 138  135 - 145 mmol/L Final  . Potassium 01/18/2017 3.9  3.5 - 5.1 mmol/L Final  . Chloride 01/18/2017 98* 101 - 111 mmol/L Final  . CO2 01/18/2017 31  22 - 32 mmol/L Final  . Glucose, Bld 01/18/2017 114* 65 - 99 mg/dL Final  . BUN 01/18/2017 14  6 - 20 mg/dL Final  . Creatinine, Ser 01/18/2017 1.21* 0.44 - 1.00 mg/dL Final  . Calcium 01/18/2017 9.3  8.9 - 10.3 mg/dL Final  . Total Protein 01/18/2017 7.9  6.5 - 8.1 g/dL Final  . Albumin 01/18/2017 4.1  3.5 - 5.0 g/dL Final  . AST 01/18/2017 21  15 - 41 U/L Final  . ALT 01/18/2017 24  14 - 54 U/L Final  . Alkaline Phosphatase 01/18/2017 71  38 - 126 U/L Final  . Total Bilirubin 01/18/2017 0.8  0.3 - 1.2 mg/dL Final  . GFR calc non Af Amer 01/18/2017 46* >60 mL/min Final  . GFR calc Af Amer 01/18/2017 54* >60 mL/min Final   Comment: (NOTE)  The eGFR has been calculated using the CKD EPI  equation. This calculation has not been validated in all clinical situations. eGFR's persistently <60 mL/min signify possible Chronic Kidney Disease.   . Anion gap 01/18/2017 9  5 - 15 Final  . WBC 01/18/2017 10.3  3.6 - 11.0 K/uL Final  . RBC 01/18/2017 4.66  3.80 - 5.20 MIL/uL Final  . Hemoglobin 01/18/2017 12.0  12.0 - 16.0 g/dL Final  . HCT 01/18/2017 37.5  35.0 - 47.0 % Final  . MCV 01/18/2017 80.3  80.0 - 100.0 fL Final  . MCH 01/18/2017 25.7* 26.0 - 34.0 pg Final  . MCHC 01/18/2017 32.0  32.0 - 36.0 g/dL Final  . RDW 01/18/2017 15.0* 11.5 - 14.5 % Final  . Platelets 01/18/2017 260  150 - 440 K/uL Final  . Neutrophils Relative % 01/18/2017 67  % Final  . Neutro Abs 01/18/2017 6.9* 1.4 - 6.5 K/uL Final  . Lymphocytes Relative 01/18/2017 25  % Final  . Lymphs Abs 01/18/2017 2.6  1.0 - 3.6 K/uL Final  . Monocytes Relative 01/18/2017 7  % Final  . Monocytes Absolute 01/18/2017 0.7  0.2 - 0.9 K/uL Final  . Eosinophils Relative 01/18/2017 1  % Final  . Eosinophils Absolute 01/18/2017 0.1  0 - 0.7 K/uL Final  . Basophils Relative 01/18/2017 0  % Final  . Basophils Absolute 01/18/2017 0.0  0 - 0.1 K/uL Final    Assessment:  DEZIRE TURK is a 66 y.o. female with stage I right breast cancer s/p  partial mastectomy on 09/20/2014.  Pathology revealed a 0.9 cm grade 1 invasive mammary carcinoma. There was no lymphovascular invasion.  Initial caudal margin was positive with invasive carcinoma. Re-resection was negative. One sentinel lymph node was negative.  Tumor was ER+ (> 90%), PR+ (>90%), and Her2/neu 1+.  Pathologic stage was T1bN0.  She did not receive radiation secondary to her morbid conditions. She could not lay flat for radiation.  She started Femara in 10/2014. She switched to Aromasin secondary to poor tolerance of Femara.  Bilateral diagnostic mammogram on 01/17/2017 revealed no evidence of malignancy in either breast. There were lumpectomy changes in the outer right  breast.  CA27.29 has been followed: 34.0 on 12/19/2015 and 28.3 on 07/01/2016.  Bone density on 05/20/2011 was normal with a T-score of 0.4 in the left femur.  Bone density on 08/30/2016 was normal with a T-score of 0.1 in the left femur.  Bone scan on 01/01/2016 revealed multifocal degenerative changes without evidence of metastatic disease.  She has microcytic RBC indices.  Labs on 07/01/2016 revealed a ferritin 133, iron saturation 15% and TIBC 337.  Symptomatically, she is doing well. Patient has no breast concerns.  Exam is stbale. Labs areunremarkable.   Plan: 1.  Labs today:  CBC with diff, CMP, CA27.29. 2.  Discuss interval mammogram- no evidence of malignancy. 3.  Discuss interval bone density study- normal. 4.  Continue Aromasin as previously prescribed.  5.  Patient continues on hormonal therapy. Discuss taking  calcium 1200 mg and vitamin D 800 IU daily. 6.  RTC in 6 months for MD assessment and labs (CBC with diff, CMP, CA27.29).   Honor Loh, NP  01/18/2017, 12:04 PM   I saw and evaluated the patient, participating in the key portions of the service and reviewing pertinent diagnostic studies and records.  I reviewed the nurse practitioner's note and agree with the findings and the plan.  The assessment and plan were discussed with  the patient.  Several questions were asked by the patient and answered.   Nolon Stalls, MD 01/18/2017,12:04 PM

## 2017-01-19 LAB — CA 27.29 (SERIAL MONITOR): CA 27.29: 19.8 U/mL (ref 0.0–38.6)

## 2017-03-09 ENCOUNTER — Telehealth: Payer: Self-pay | Admitting: Hematology and Oncology

## 2017-03-09 NOTE — Telephone Encounter (Addendum)
Oral Oncology Patient Advocate Encounter  Was successful in securing patient a one time fill grant from [employer] to provide copayment coverage for her Aromasin. Pending waiting on information from the patient. They are sending patient a letter in 5-7 days. This will keep the out of pocket expense at $0.    I have spoken with the patient.    The billing information is as follows and has been shared with [place].   Member ID: [id number] Group ID: [id number] RxBin: [id number] Dates of Eligibility: 03/09/2017 through 04/08/2017  [place] Patient Advocate [phone] 03/09/2017 1:18 PM    Oral Oncology Patient Advocate Encounter  Was successful in securing patient an $ 4,000.00 grant from [employer] [place] (PAF) to provide copayment coverage for her Aromasin.  This will keep the out of pocket expense at $0.    I have spoken with the patient.    The billing information is as follows and has been shared with [place].   Member ID: [id number] Group ID: [id number] RxBin: [id number] Dates of Eligibility: 09/10/2016 through 03/10/2018   [place] Patient Advocate [phone] 03/09/2017 1:20 PM

## 2017-03-10 ENCOUNTER — Telehealth: Payer: Self-pay | Admitting: Pharmacist

## 2017-03-10 ENCOUNTER — Other Ambulatory Visit: Payer: Self-pay | Admitting: Hematology and Oncology

## 2017-03-10 DIAGNOSIS — C50911 Malignant neoplasm of unspecified site of right female breast: Secondary | ICD-10-CM

## 2017-03-10 MED ORDER — EXEMESTANE 25 MG PO TABS: ORAL_TABLET | ORAL | 3 refills | Status: DC

## 2017-03-10 MED FILL — EXEMESTANE 25 MG TABLET: 25 | 90 days supply | Qty: 90 | Fill #0

## 2017-03-10 NOTE — Telephone Encounter (Signed)
Oral Chemotherapy Pharmacist Encounter  Foundation assistance opened up to cover the copay of Ms. Betsill's Aromasin. A prescription for her Aromasin was sent now be sent to [facility].   Attempted to call Ms. Olver to inform her, no answer, LVM for patient to call me back.  Darl Pikes, PharmD, BCPS Hematology/Oncology Clinical Pharmacist [facility] [phone]  03/10/2017 12:35 PM

## 2017-03-10 NOTE — Telephone Encounter (Signed)
Oral Chemotherapy Pharmacist Encounter  Spoke with Teresa Holland and she is aware that her medication will be coming from [facility]. Verified her address for shipping.   Darl Pikes, PharmD, BCPS Hematology/Oncology Clinical Pharmacist [facility] [phone]  03/10/2017 2:54 PM

## 2017-03-24 ENCOUNTER — Telehealth: Payer: Self-pay | Admitting: Hematology and Oncology

## 2017-03-24 NOTE — Telephone Encounter (Signed)
Oral Oncology Patient Advocate Encounter  PAF [employer]-Pay [employer] Program diagnosis verification form was completed and faxed to [employer] at [phone]  This is the last step from the office needed to secure continued patient access to funding.   [place] Patient Advocate [phone] 03/24/2017 1:52 PM

## 2017-03-25 ENCOUNTER — Telehealth: Payer: Self-pay | Admitting: Hematology and Oncology

## 2017-03-25 NOTE — Telephone Encounter (Signed)
Oral Oncology Patient Advocate Encounter  Faxed the application to the [employer] [place]. Patient was approved for 1 month fill. Hoping we will get more grant funds to help out.   [phone] Fax  [phone] Phone   [facility] Patient Advocate [phone] 03/25/2017 12:10 PM

## 2017-03-29 NOTE — Telephone Encounter (Signed)
Oral Oncology Patient Advocate Encounter   The patient was approved thru [employer] till 02/28/2018.    [place] Patient Advocate [phone] 03/29/2017 9:24 AM

## 2017-05-02 DIAGNOSIS — Z853 Personal history of malignant neoplasm of breast: Secondary | ICD-10-CM | POA: Insufficient documentation

## 2017-05-02 IMAGING — MG 2D DIGITAL DIAGNOSTIC BILATERAL MAMMOGRAM WITH CAD AND ADJUNCT T
8 of 17 series · 8 of 33 positions shown · non-contrast
Comparison: Previous exam(s).

ACR Breast Density Category a: The breast tissue is almost entirely
fatty.

CLINICAL DATA: Annual examination. History of right breast cancer
status post lumpectomy in 0940.

EXAM:
2D DIGITAL DIAGNOSTIC BILATERAL MAMMOGRAM WITH CAD AND ADJUNCT TOMO

[L CC]
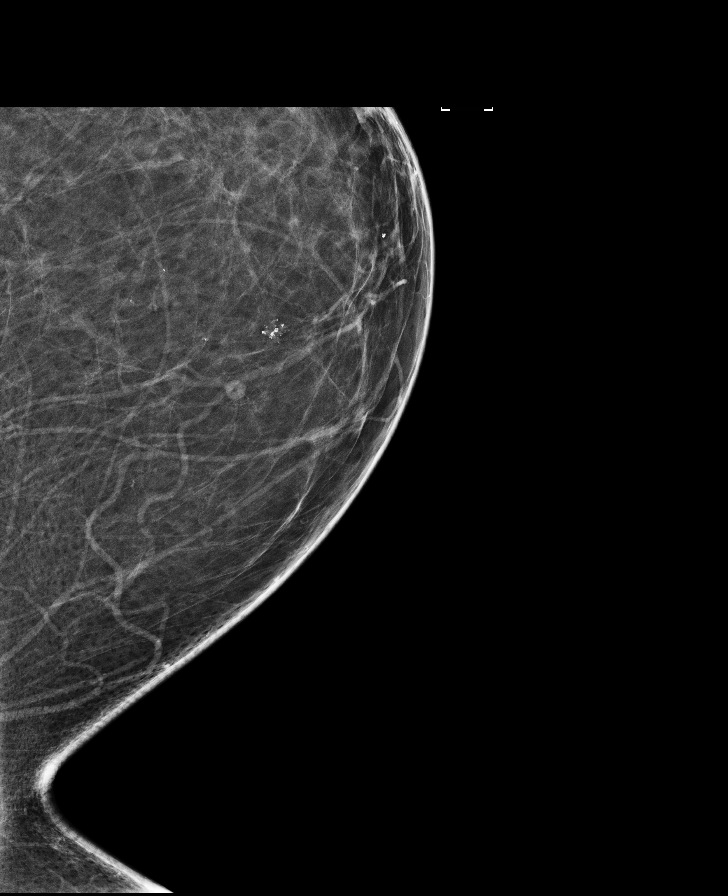

[R XCCL]
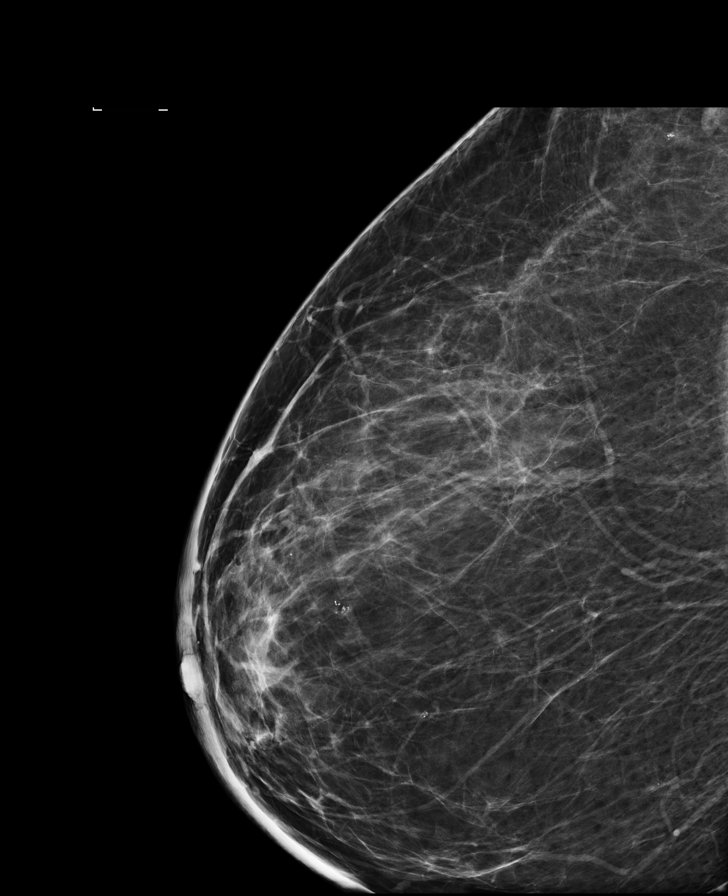

[R CC]
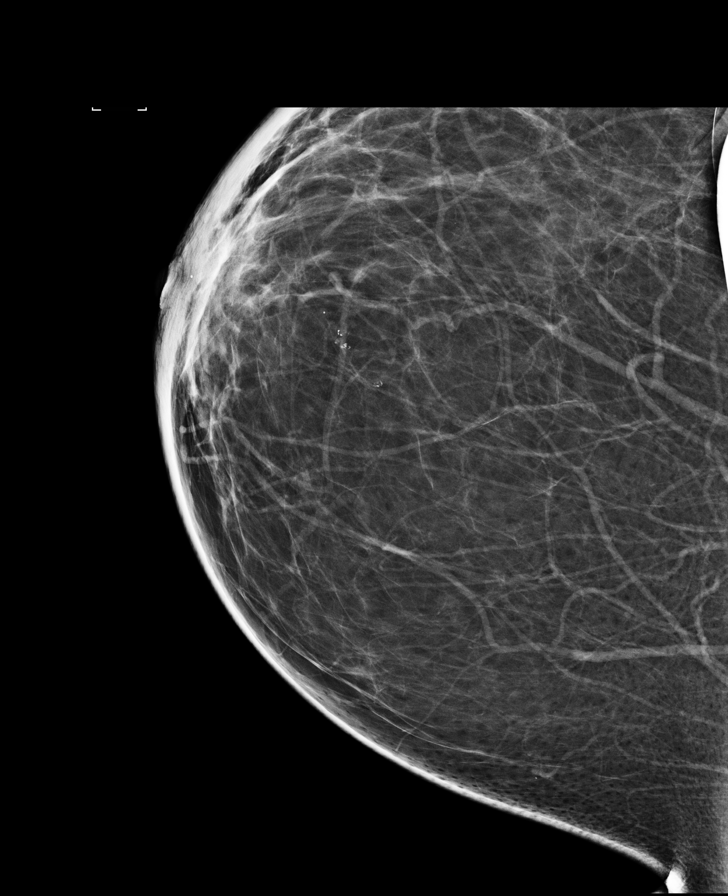

[R MLO (1 of 2)]
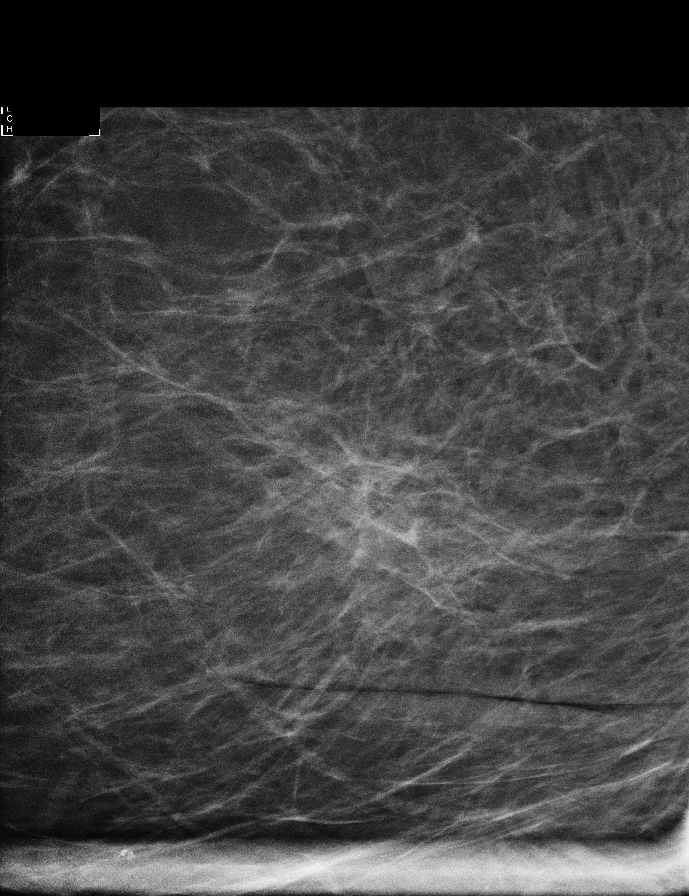

[L MLO (1 of 2)]
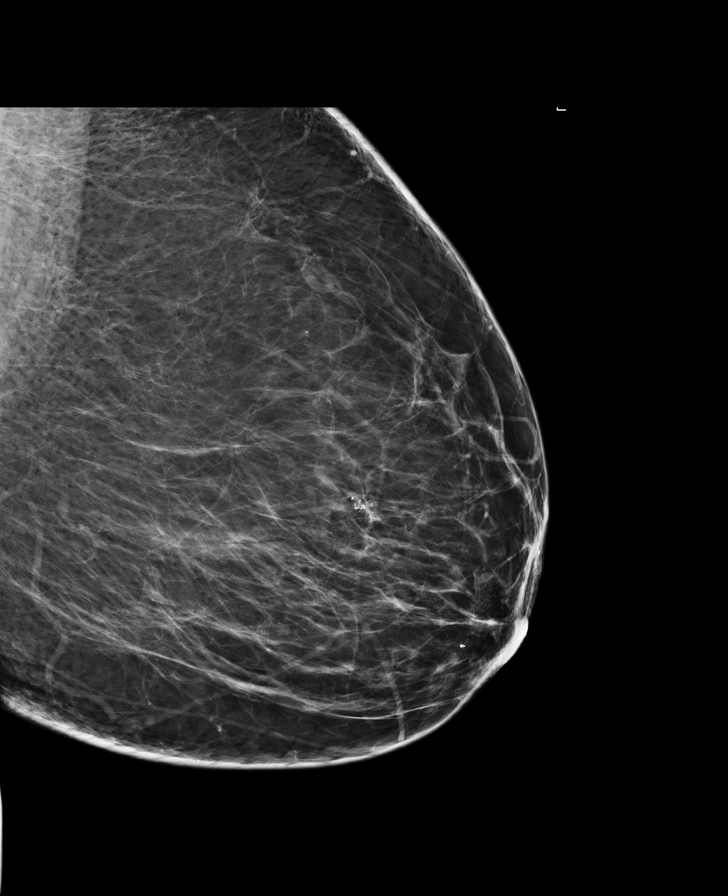

[R CC synth-2D]
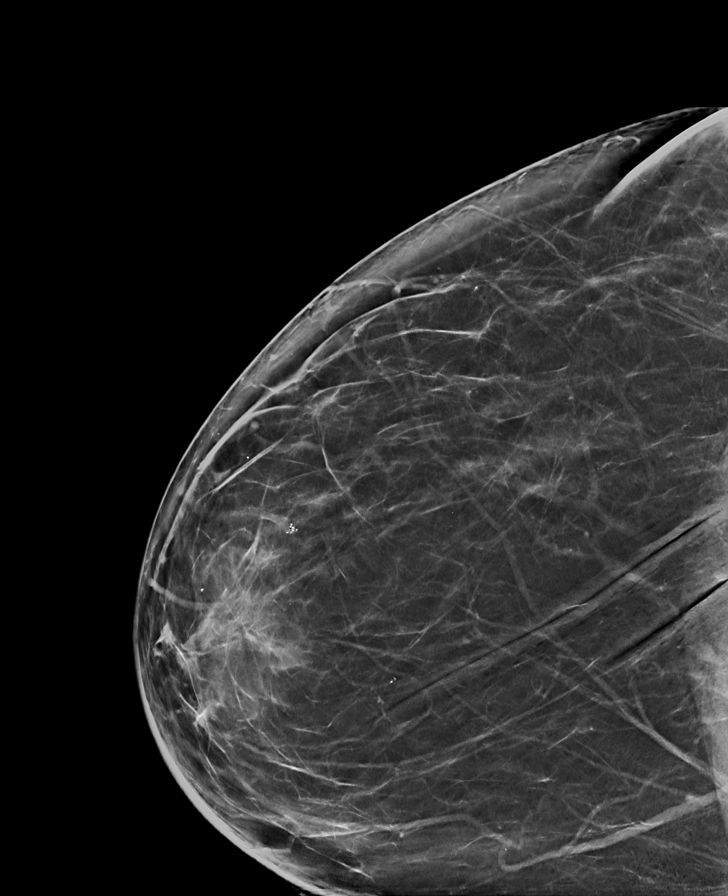

[L MLO (2 of 2)]
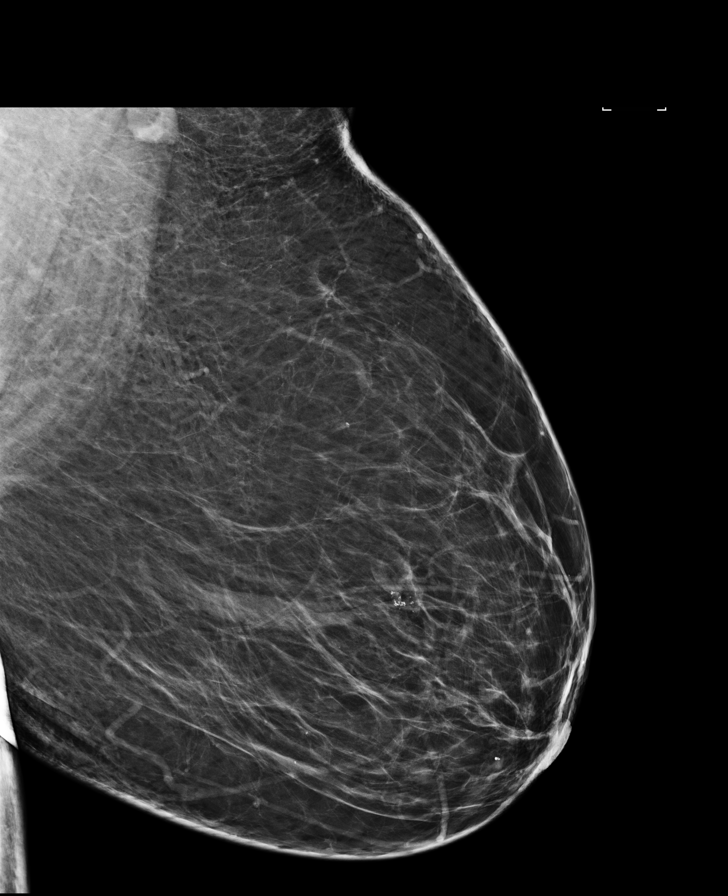

[R MLO (2 of 2)]
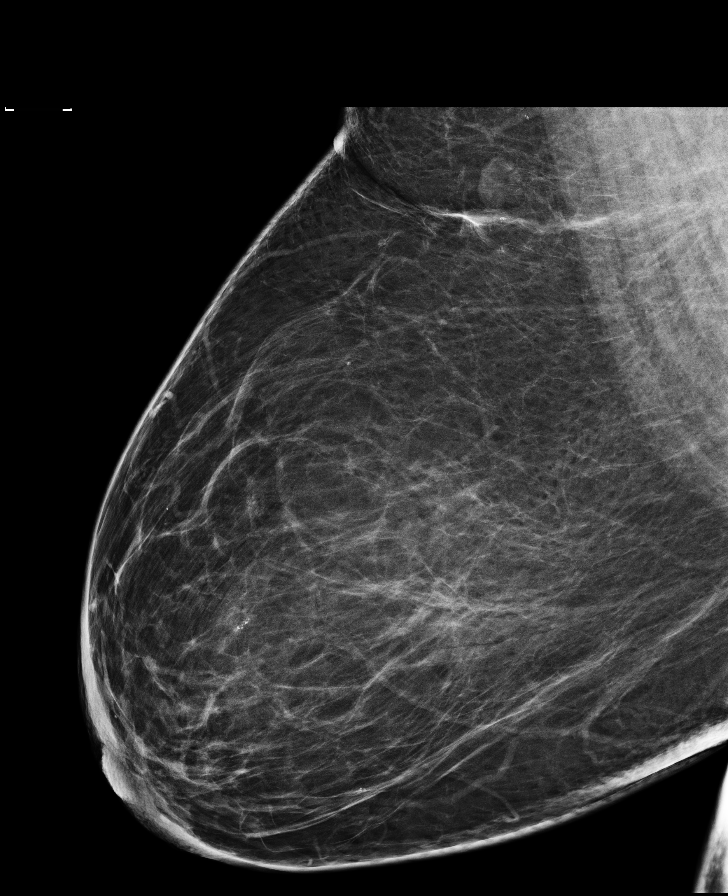

[8 of 33 positions shown; findings below may reference images not displayed]

FINDINGS: There are lumpectomy changes in the outer right breast. Previously
described seroma in the lower outer right breast has
decreased/nearly resolved. No mass, nonsurgical distortion, or
suspicious microcalcification is identified in either breast to
suggest malignancy.

Mammographic images were processed with CAD.
IMPRESSION: No evidence of malignancy in either breast. Lumpectomy changes in
the outer right breast.

RECOMMENDATION:
Diagnostic mammogram is suggested in 1 year. (Code:[id number])

I have discussed the findings and recommendations with the patient.
Results were also provided in writing at the conclusion of the
visit. If applicable, a reminder letter will be sent to the patient
regarding the next appointment.

BI-RADS CATEGORY  2: Benign.

## 2017-05-19 ENCOUNTER — Telehealth: Payer: Self-pay | Admitting: Hematology and Oncology

## 2017-05-19 NOTE — Telephone Encounter (Signed)
Oral Oncology Patient Advocate Encounter  Patient called to let me know she had gotten a letter from [facility] and her grant needed to be used by May or she would loose it. I put a note in [place] to please use on the 05/27/2017. She has two grants.   Called patient to let her know I was aware of the grant and we will use on 05/27/2017. Call me if she needs me.    [facility] Patient Advocate [phone] 05/19/2017 8:37 AM

## 2017-06-16 ENCOUNTER — Encounter: Payer: Self-pay | Admitting: *Deleted

## 2017-06-16 NOTE — Anesthesia Pain Management Evaluation Note (Signed)
EXTREMELY CLAUSTROPHOBIC. STATES HEART STOPPED DURING GB SURGERY BECAUSE UNDER TOO LONG. NO PROBLEM SINCE

## 2017-06-28 ENCOUNTER — Encounter
Admission: RE | Disposition: A | Discharge status: Home / Self Care | Payer: Self-pay | Source: Ambulatory Visit | Attending: Ophthalmology

## 2017-06-28 ENCOUNTER — Ambulatory Visit
Admission: RE | Admit: 2017-06-28 | Discharge: 2017-06-28 | Disposition: A | Discharge status: Home / Self Care | Payer: Medicare PPO | Source: Ambulatory Visit | Attending: Ophthalmology | Admitting: Ophthalmology

## 2017-06-28 ENCOUNTER — Ambulatory Visit: Payer: Medicare PPO | Admitting: Certified Registered Nurse Anesthetist

## 2017-06-28 ENCOUNTER — Other Ambulatory Visit: Payer: Self-pay

## 2017-06-28 DIAGNOSIS — Z853 Personal history of malignant neoplasm of breast: Secondary | ICD-10-CM | POA: Insufficient documentation

## 2017-06-28 DIAGNOSIS — G4730 Sleep apnea, unspecified: Secondary | ICD-10-CM | POA: Insufficient documentation

## 2017-06-28 DIAGNOSIS — K219 Gastro-esophageal reflux disease without esophagitis: Secondary | ICD-10-CM | POA: Insufficient documentation

## 2017-06-28 DIAGNOSIS — Z888 Allergy status to other drugs, medicaments and biological substances status: Secondary | ICD-10-CM | POA: Diagnosis not present

## 2017-06-28 DIAGNOSIS — E1136 Type 2 diabetes mellitus with diabetic cataract: Secondary | ICD-10-CM | POA: Diagnosis not present

## 2017-06-28 DIAGNOSIS — Z886 Allergy status to analgesic agent status: Secondary | ICD-10-CM | POA: Diagnosis not present

## 2017-06-28 DIAGNOSIS — I10 Essential (primary) hypertension: Secondary | ICD-10-CM | POA: Diagnosis not present

## 2017-06-28 DIAGNOSIS — E7800 Pure hypercholesterolemia, unspecified: Secondary | ICD-10-CM | POA: Insufficient documentation

## 2017-06-28 DIAGNOSIS — J45909 Unspecified asthma, uncomplicated: Secondary | ICD-10-CM | POA: Diagnosis not present

## 2017-06-28 DIAGNOSIS — G43909 Migraine, unspecified, not intractable, without status migrainosus: Secondary | ICD-10-CM | POA: Insufficient documentation

## 2017-06-28 DIAGNOSIS — E079 Disorder of thyroid, unspecified: Secondary | ICD-10-CM | POA: Insufficient documentation

## 2017-06-28 DIAGNOSIS — Z90710 Acquired absence of both cervix and uterus: Secondary | ICD-10-CM | POA: Insufficient documentation

## 2017-06-28 DIAGNOSIS — M1990 Unspecified osteoarthritis, unspecified site: Secondary | ICD-10-CM | POA: Diagnosis not present

## 2017-06-28 DIAGNOSIS — Z9049 Acquired absence of other specified parts of digestive tract: Secondary | ICD-10-CM | POA: Diagnosis not present

## 2017-06-28 DIAGNOSIS — F17290 Nicotine dependence, other tobacco product, uncomplicated: Secondary | ICD-10-CM | POA: Insufficient documentation

## 2017-06-28 DIAGNOSIS — F40240 Claustrophobia: Secondary | ICD-10-CM | POA: Diagnosis not present

## 2017-06-28 DIAGNOSIS — H2511 Age-related nuclear cataract, right eye: Secondary | ICD-10-CM | POA: Diagnosis present

## 2017-06-28 HISTORY — PX: CATARACT EXTRACTION W/PHACO: SHX586

## 2017-06-28 HISTORY — DX: Claustrophobia: F40.240

## 2017-06-28 LAB — GLUCOSE, CAPILLARY: Glucose-Capillary: 82 mg/dL (ref 65–99)

## 2017-06-28 SURGERY — PHACOEMULSIFICATION, CATARACT, WITH IOL INSERTION
Anesthesia: Monitor Anesthesia Care | Site: Eye | Laterality: Right | Wound class: "Clean "

## 2017-06-28 MED ORDER — LIDOCAINE HCL (PF) 4 % IJ SOLN
INTRAOCULAR | Status: DC | PRN
  Administered 2017-06-28: 4 mL via OPHTHALMIC

## 2017-06-28 MED ORDER — FENTANYL CITRATE (PF) 100 MCG/2ML IJ SOLN: 25.0000 ug | INTRAMUSCULAR | Status: DC | PRN

## 2017-06-28 MED ORDER — LIDOCAINE HCL (PF) 4 % IJ SOLN
INTRAMUSCULAR | Status: AC
  Filled 2017-06-28: qty 5

## 2017-06-28 MED ORDER — LIDOCAINE HCL (PF) 2 % IJ SOLN
INTRAMUSCULAR | Status: AC
  Filled 2017-06-28: qty 10

## 2017-06-28 MED ORDER — MIDAZOLAM HCL 2 MG/2ML IJ SOLN
INTRAMUSCULAR | Status: DC | PRN
  Administered 2017-06-28 (×4): 1 mg via INTRAVENOUS

## 2017-06-28 MED ORDER — TRYPAN BLUE 0.06 % OP SOLN
OPHTHALMIC | Status: DC | PRN
  Administered 2017-06-28: 0.5 mL via INTRAOCULAR

## 2017-06-28 MED ORDER — KETAMINE HCL 50 MG/ML IJ SOLN
INTRAMUSCULAR | Status: AC
  Filled 2017-06-28: qty 10

## 2017-06-28 MED ORDER — EPINEPHRINE PF 1 MG/ML IJ SOLN
INTRAOCULAR | Status: DC | PRN
  Administered 2017-06-28: 08:00:00 via OPHTHALMIC

## 2017-06-28 MED ORDER — ARMC OPHTHALMIC DILATING DROPS
1.0000 "application " | OPHTHALMIC | Status: AC
  Administered 2017-06-28 (×3): 1 via OPHTHALMIC

## 2017-06-28 MED ORDER — NA CHONDROIT SULF-NA HYALURON 40-17 MG/ML IO SOLN
INTRAOCULAR | Status: AC
  Filled 2017-06-28: qty 1

## 2017-06-28 MED ORDER — EPINEPHRINE PF 1 MG/ML IJ SOLN
INTRAMUSCULAR | Status: AC
Start: 2017-06-28 — End: ?
  Filled 2017-06-28: qty 2

## 2017-06-28 MED ORDER — MOXIFLOXACIN HCL 0.5 % OP SOLN
OPHTHALMIC | Status: AC
  Filled 2017-06-28: qty 3

## 2017-06-28 MED ORDER — SODIUM CHLORIDE 0.9 % IV SOLN
INTRAVENOUS | Status: DC
  Administered 2017-06-28: 07:00:00 via INTRAVENOUS

## 2017-06-28 MED ORDER — MOXIFLOXACIN HCL 0.5 % OP SOLN: 1.0000 [drp] | OPHTHALMIC | Status: DC | PRN

## 2017-06-28 MED ORDER — FENTANYL CITRATE (PF) 100 MCG/2ML IJ SOLN
INTRAMUSCULAR | Status: AC
  Filled 2017-06-28: qty 2

## 2017-06-28 MED ORDER — NA CHONDROIT SULF-NA HYALURON 40-17 MG/ML IO SOLN
INTRAOCULAR | Status: DC | PRN
  Administered 2017-06-28: 1 mL via INTRAOCULAR

## 2017-06-28 MED ORDER — PROPOFOL 10 MG/ML IV BOLUS
INTRAVENOUS | Status: AC
  Filled 2017-06-28: qty 20

## 2017-06-28 MED ORDER — POVIDONE-IODINE 5 % OP SOLN
OPHTHALMIC | Status: DC | PRN
  Administered 2017-06-28: 1 via OPHTHALMIC

## 2017-06-28 MED ORDER — CARBACHOL 0.01 % IO SOLN
INTRAOCULAR | Status: DC | PRN
  Administered 2017-06-28: 0.5 mL via INTRAOCULAR

## 2017-06-28 MED ORDER — POVIDONE-IODINE 5 % OP SOLN
OPHTHALMIC | Status: AC
  Filled 2017-06-28: qty 30

## 2017-06-28 MED ORDER — MIDAZOLAM HCL 2 MG/2ML IJ SOLN
INTRAMUSCULAR | Status: AC
  Filled 2017-06-28: qty 2

## 2017-06-28 MED ORDER — ARMC OPHTHALMIC DILATING DROPS
OPHTHALMIC | Status: AC
  Administered 2017-06-28: 1 via OPHTHALMIC
  Filled 2017-06-28: qty 0.4

## 2017-06-28 MED ORDER — ONDANSETRON HCL 4 MG/2ML IJ SOLN: 4.0000 mg | Freq: Once | INTRAMUSCULAR | Status: DC | PRN

## 2017-06-28 MED ORDER — MOXIFLOXACIN HCL 0.5 % OP SOLN
OPHTHALMIC | Status: DC | PRN
  Administered 2017-06-28: 0.2 mL via OPHTHALMIC

## 2017-06-28 SURGICAL SUPPLY — 24 items
CANNULA ANT/CHMB 27G (MISCELLANEOUS) IMPLANT
CANNULA ANT/CHMB 27GA (MISCELLANEOUS) ×4 IMPLANT
DEVICE MILOOP (MISCELLANEOUS) IMPLANT
FILTER MILLEX .045 (MISCELLANEOUS) IMPLANT
FLTR MILLEX .045 (MISCELLANEOUS) ×2
GLOVE BIO SURGEON STRL SZ8 (GLOVE) ×2 IMPLANT
GLOVE BIOGEL M 6.5 STRL (GLOVE) ×2 IMPLANT
GLOVE SURG LX 8.0 MICRO (GLOVE) ×1
GLOVE SURG LX STRL 8.0 MICRO (GLOVE) ×1 IMPLANT
GOWN STRL REUS W/ TWL LRG LVL3 (GOWN DISPOSABLE) ×2 IMPLANT
GOWN STRL REUS W/TWL LRG LVL3 (GOWN DISPOSABLE) ×2
LABEL CATARACT MEDS ST (LABEL) ×2 IMPLANT
LENS IOL TECNIS ITEC 14.0 (Intraocular Lens) ×1 IMPLANT
MILOOP DEVICE (MISCELLANEOUS) ×2
PACK CATARACT (MISCELLANEOUS) ×2 IMPLANT
PACK CATARACT BRASINGTON LX (MISCELLANEOUS) ×2 IMPLANT
PACK EYE AFTER SURG (MISCELLANEOUS) ×2 IMPLANT
SOL BSS BAG (MISCELLANEOUS) ×2
SOLUTION BSS BAG (MISCELLANEOUS) ×1 IMPLANT
SUT ETHILON 10 0 CS140 6 (SUTURE) ×1 IMPLANT
SYR 3ML LL SCALE MARK (SYRINGE) ×1 IMPLANT
SYR 5ML LL (SYRINGE) ×2 IMPLANT
WATER STERILE IRR 250ML POUR (IV SOLUTION) ×2 IMPLANT
WIPE NON LINTING 3.25X3.25 (MISCELLANEOUS) ×2 IMPLANT

## 2017-06-28 NOTE — Anesthesia Preprocedure Evaluation (Signed)
Anesthesia Evaluation  Patient identified by MRN, date of birth, ID band Patient awake    Reviewed: Allergy & Precautions, H&P , NPO status , Patient's Chart, lab work & pertinent test results, reviewed documented beta blocker date and time   History of Anesthesia Complications (+) AWARENESS UNDER ANESTHESIA and history of anesthetic complications  Airway Mallampati: IV  TM Distance: >3 FB Neck ROM: full    Dental no notable dental hx. (+) Teeth Intact   Pulmonary neg pulmonary ROS, shortness of breath and with exertion, asthma , sleep apnea and Continuous Positive Airway Pressure Ventilation , COPD,  COPD inhaler, former smoker,    Pulmonary exam normal breath sounds clear to auscultation       Cardiovascular Exercise Tolerance: Poor hypertension, On Medications +CHF  negative cardio ROS   Rhythm:regular Rate:Normal     Neuro/Psych  Headaches, PSYCHIATRIC DISORDERS Anxiety Depression negative neurological ROS  negative psych ROS   GI/Hepatic negative GI ROS, Neg liver ROS, GERD  Medicated,  Endo/Other  negative endocrine ROSdiabetes, Poorly Controlled, Type 2, Oral Hypoglycemic AgentsHypothyroidism Morbid obesity  Renal/GU      Musculoskeletal   Abdominal   Peds  Hematology negative hematology ROS (+)   Anesthesia Other Findings   Reproductive/Obstetrics negative OB ROS                             Anesthesia Physical Anesthesia Plan  ASA: III  Anesthesia Plan: MAC   Post-op Pain Management:    Induction:   PONV Risk Score and Plan:   Airway Management Planned:   Additional Equipment:   Intra-op Plan:   Post-operative Plan:   Informed Consent: I have reviewed the patients History and Physical, chart, labs and discussed the procedure including the risks, benefits and alternatives for the proposed anesthesia with the patient or authorized representative who has indicated  his/her understanding and acceptance.     Plan Discussed with: CRNA  Anesthesia Plan Comments:         Anesthesia Quick Evaluation

## 2017-06-28 NOTE — Transfer of Care (Signed)
Immediate Anesthesia Transfer of Care Note  Patient: Teresa Holland  Procedure(s) Performed: CATARACT EXTRACTION PHACO AND INTRAOCULAR LENS PLACEMENT (IOC) (Right Eye)  Patient Location: PACU  Anesthesia Type:MAC  Level of Consciousness: awake  Airway & Oxygen Therapy: Patient Spontanous Breathing  Post-op Assessment: Report given to RN  Post vital signs: stable  Last Vitals:  Vitals Value Taken Time  BP    Temp    Pulse    Resp    SpO2      Last Pain:  Vitals:   06/28/17 0646  TempSrc: Tympanic  PainSc: 7          Complications: No apparent anesthesia complications

## 2017-06-28 NOTE — Op Note (Signed)
PREOPERATIVE DIAGNOSIS:  Nuclear sclerotic cataract of the right eye.   POSTOPERATIVE DIAGNOSIS:  NUCLEAR SCLEROTIC CATARACT RIGHT EYE   OPERATIVE PROCEDURE: Procedure(s): CATARACT EXTRACTION PHACO AND INTRAOCULAR LENS PLACEMENT (IOC)   SURGEON:  Birder Robson, MD.   ANESTHESIA:  Anesthesiologist: Molli Barrows, MD CRNA: Carron Curie, CRNA  1.      Managed anesthesia care. 2.      0.51ml of Shugarcaine was instilled in the eye following the paracentesis.   COMPLICATIONS: Vision Blue was used to stain the anterior capsule due to very poor/ no visualization of the red reflex.      MiLoop device was used to divide the lens due to exceptional density/ advanced state of it   TECHNIQUE:   Stop and chop   DESCRIPTION OF PROCEDURE:  The patient was examined and consented in the preoperative holding area where the aforementioned topical anesthesia was applied to the right eye and then brought back to the Operating Room where the right eye was prepped and draped in the usual sterile ophthalmic fashion and a lid speculum was placed. A paracentesis was created with the side port blade and the anterior chamber was filled with viscoelastic. A near clear corneal incision was performed with the steel keratome. A continuous curvilinear capsulorrhexis was performed with a cystotome followed by the capsulorrhexis forceps. Hydrodissection and hydrodelineation were carried out with BSS on a blunt cannula. The lens was removed in a stop and chop  technique and the remaining cortical material was removed with the irrigation-aspiration handpiece. The capsular bag was inflated with viscoelastic and the Technis [id number]  lens was placed in the capsular bag without complication. The remaining viscoelastic was removed from the eye with the irrigation-aspiration handpiece. The wounds were hydrated. The anterior chamber was flushed with Miostat and the eye was inflated to physiologic pressure. 0.75ml of Vigamox was  placed in the anterior chamber. The wounds were found to be water tight. The eye was dressed with Vigamox. The patient was given protective glasses to wear throughout the day and a shield with which to sleep tonight. The patient was also given drops with which to begin a drop regimen today and will follow-up with me in one day. Implant Name Type Inv. Item Serial No. Manufacturer Lot No. LRB No. Used  LENS IOL DIOP 14.0 - [id number] Intraocular Lens LENS IOL DIOP 14.0 [id number] AMO  Right 1   Procedure(s) with comments: CATARACT EXTRACTION PHACO AND INTRAOCULAR LENS PLACEMENT (IOC) (Right) - [place] 02:20.6 AP% 20.6 CDE 28.90 Fluid Pack Lot # [id number]  Electronically signed: Birder Robson 06/28/2017 8:33 AM

## 2017-06-28 NOTE — Anesthesia Post-op Follow-up Note (Signed)
Anesthesia QCDR form completed.        

## 2017-06-28 NOTE — H&P (Signed)
All labs reviewed. Abnormal studies sent to patients PCP when indicated.  Previous H&P reviewed, patient examined, there are NO CHANGES.  Teresa Lineman Porfilio4/30/20197:48 AM

## 2017-06-28 NOTE — Discharge Instructions (Signed)
Eye Surgery Discharge Instructions  Expect mild scratchy sensation or mild soreness. DO NOT RUB YOUR EYE!  The day of surgery:  Minimal physical activity, but bed rest is not required  No reading, computer work, or close hand work  No bending, lifting, or straining.  May watch TV  For 24 hours:  No driving, legal decisions, or alcoholic beverages  Safety precautions  Eat anything you prefer: It is better to start with liquids, then soup then solid foods.  _____ Eye patch should be worn until postoperative exam tomorrow.  ____ Solar shield eyeglasses should be worn for comfort in the sunlight/patch while sleeping  Resume all regular medications including aspirin or Coumadin if these were discontinued prior to surgery. You may shower, bathe, shave, or wash your hair. Tylenol may be taken for mild discomfort.  Call your doctor if you experience significant pain, nausea, or vomiting, fever > 101 or other signs of infection. [phone] or [phone] Specific instructions:  Follow-up Information    Birder Robson, MD Follow up on 06/29/2017.   Specialty:  Ophthalmology Why:  10:50 Contact information: [address] [place] [address] [phone]

## 2017-06-29 NOTE — Anesthesia Postprocedure Evaluation (Signed)
Anesthesia Post Note  Patient: BRAYLI KLINGBEIL  Procedure(s) Performed: CATARACT EXTRACTION PHACO AND INTRAOCULAR LENS PLACEMENT (IOC) (Right Eye)  Patient location during evaluation: PACU Anesthesia Type: MAC Level of consciousness: awake and alert Pain management: pain level controlled Vital Signs Assessment: post-procedure vital signs reviewed and stable Respiratory status: spontaneous breathing, nonlabored ventilation, respiratory function stable and patient connected to nasal cannula oxygen Cardiovascular status: blood pressure returned to baseline and stable Postop Assessment: no apparent nausea or vomiting Anesthetic complications: no     Last Vitals:  Vitals:   06/28/17 0646 06/28/17 0835  BP: (!) 145/74 125/63  Pulse: 69 69  Resp: (!) 22 16  Temp: (!) 35.2 C 36.8 C  SpO2: 97% 100%    Last Pain:  Vitals:   06/29/17 0858  TempSrc:   PainSc: 0-No pain                 Molli Barrows

## 2017-07-19 ENCOUNTER — Other Ambulatory Visit: Payer: Medicare PPO

## 2017-07-19 ENCOUNTER — Ambulatory Visit: Payer: Medicare PPO | Admitting: Hematology and Oncology

## 2017-08-02 ENCOUNTER — Inpatient Hospital Stay: Payer: Medicare PPO | Attending: Hematology and Oncology

## 2017-08-02 ENCOUNTER — Inpatient Hospital Stay: Payer: Medicare PPO | Admitting: Hematology and Oncology

## 2017-08-02 ENCOUNTER — Other Ambulatory Visit: Payer: Self-pay

## 2017-08-02 VITALS — BP 169/91 | HR 74 | Temp 96.9°F | Resp 20 | Wt 356.2 lb

## 2017-08-02 DIAGNOSIS — F418 Other specified anxiety disorders: Secondary | ICD-10-CM

## 2017-08-02 DIAGNOSIS — K219 Gastro-esophageal reflux disease without esophagitis: Secondary | ICD-10-CM | POA: Insufficient documentation

## 2017-08-02 DIAGNOSIS — C50511 Malignant neoplasm of lower-outer quadrant of right female breast: Secondary | ICD-10-CM

## 2017-08-02 DIAGNOSIS — Z87891 Personal history of nicotine dependence: Secondary | ICD-10-CM

## 2017-08-02 DIAGNOSIS — G4730 Sleep apnea, unspecified: Secondary | ICD-10-CM | POA: Diagnosis not present

## 2017-08-02 DIAGNOSIS — I110 Hypertensive heart disease with heart failure: Secondary | ICD-10-CM | POA: Diagnosis not present

## 2017-08-02 DIAGNOSIS — I509 Heart failure, unspecified: Secondary | ICD-10-CM | POA: Insufficient documentation

## 2017-08-02 DIAGNOSIS — Z170 Estrogen receptor positive status [ER+]: Secondary | ICD-10-CM | POA: Diagnosis not present

## 2017-08-02 DIAGNOSIS — Z79899 Other long term (current) drug therapy: Secondary | ICD-10-CM

## 2017-08-02 DIAGNOSIS — Z79811 Long term (current) use of aromatase inhibitors: Secondary | ICD-10-CM | POA: Diagnosis not present

## 2017-08-02 DIAGNOSIS — J449 Chronic obstructive pulmonary disease, unspecified: Secondary | ICD-10-CM | POA: Diagnosis not present

## 2017-08-02 DIAGNOSIS — E119 Type 2 diabetes mellitus without complications: Secondary | ICD-10-CM | POA: Insufficient documentation

## 2017-08-02 DIAGNOSIS — Z7984 Long term (current) use of oral hypoglycemic drugs: Secondary | ICD-10-CM | POA: Insufficient documentation

## 2017-08-02 DIAGNOSIS — Z801 Family history of malignant neoplasm of trachea, bronchus and lung: Secondary | ICD-10-CM | POA: Diagnosis not present

## 2017-08-02 DIAGNOSIS — Z800 Family history of malignant neoplasm of digestive organs: Secondary | ICD-10-CM | POA: Diagnosis not present

## 2017-08-02 DIAGNOSIS — G893 Neoplasm related pain (acute) (chronic): Secondary | ICD-10-CM | POA: Diagnosis not present

## 2017-08-02 DIAGNOSIS — Z803 Family history of malignant neoplasm of breast: Secondary | ICD-10-CM

## 2017-08-02 DIAGNOSIS — M549 Dorsalgia, unspecified: Secondary | ICD-10-CM

## 2017-08-02 DIAGNOSIS — E039 Hypothyroidism, unspecified: Secondary | ICD-10-CM | POA: Insufficient documentation

## 2017-08-02 DIAGNOSIS — E669 Obesity, unspecified: Secondary | ICD-10-CM | POA: Diagnosis not present

## 2017-08-02 DIAGNOSIS — C50911 Malignant neoplasm of unspecified site of right female breast: Secondary | ICD-10-CM

## 2017-08-02 LAB — CBC WITH DIFFERENTIAL/PLATELET
Basophils Absolute: 0.1 10*3/uL (ref 0–0.1)
Basophils Relative: 1 %
Eosinophils Absolute: 0.1 10*3/uL (ref 0–0.7)
Eosinophils Relative: 1 %
HCT: 39.3 % (ref 35.0–47.0)
Hemoglobin: 12.8 g/dL (ref 12.0–16.0)
Lymphocytes Relative: 22 %
Lymphs Abs: 2.1 10*3/uL (ref 1.0–3.6)
MCH: 26.2 pg (ref 26.0–34.0)
MCHC: 32.6 g/dL (ref 32.0–36.0)
MCV: 80.4 fL (ref 80.0–100.0)
Monocytes Absolute: 0.5 10*3/uL (ref 0.2–0.9)
Monocytes Relative: 5 %
Neutro Abs: 6.9 10*3/uL — ABNORMAL HIGH (ref 1.4–6.5)
Neutrophils Relative %: 71 %
Platelets: 242 10*3/uL (ref 150–440)
RBC: 4.89 MIL/uL (ref 3.80–5.20)
RDW: 15.1 % — ABNORMAL HIGH (ref 11.5–14.5)
WBC: 9.7 10*3/uL (ref 3.6–11.0)

## 2017-08-02 LAB — COMPREHENSIVE METABOLIC PANEL
ALT: 16 U/L (ref 14–54)
AST: 20 U/L (ref 15–41)
Albumin: 4.1 g/dL (ref 3.5–5.0)
Alkaline Phosphatase: 68 U/L (ref 38–126)
Anion gap: 13 (ref 5–15)
BUN: 13 mg/dL (ref 6–20)
CO2: 28 mmol/L (ref 22–32)
Calcium: 9.5 mg/dL (ref 8.9–10.3)
Chloride: 101 mmol/L (ref 101–111)
Creatinine, Ser: 1.25 mg/dL — ABNORMAL HIGH (ref 0.44–1.00)
GFR calc Af Amer: 52 mL/min — ABNORMAL LOW (ref >60)
GFR calc non Af Amer: 44 mL/min — ABNORMAL LOW (ref >60)
Glucose, Bld: 112 mg/dL — ABNORMAL HIGH (ref 65–99)
Potassium: 3.8 mmol/L (ref 3.5–5.1)
Sodium: 142 mmol/L (ref 135–145)
Total Bilirubin: 0.6 mg/dL (ref 0.3–1.2)
Total Protein: 7.6 g/dL (ref 6.5–8.1)

## 2017-08-02 NOTE — Progress Notes (Signed)
[facility] day:  08/02/2017  Chief Complaint: Teresa Holland is a 67 y.o. female with stage I right breast cancer who is seen for 6 month assessment.  HPI:  The patient was last seen in the medical oncology clinic on 01/18/2017.  At that time, she was doing well. She had no breast concerns.  Exam was stable. Labs were unremarkable.   During the interim, patient is doing well. She continues to have chronic back pain. Vision issues have improved with eye surgery. Patient denies that she has experienced any B symptoms. Additionally, she denies any interval infections.  Patient does not verbalize any concerns with regards to her breasts today. Patient does perform monthly self breast examinations as recommended. Patient continues on her Aromasin.  Patient maintains an adequate appetite, and notes that she is eating well. Weight, compared to her last visit to the clinic, has decreased by 5 pounds.   She complains of pain rated 8/10 in clinic today.    Past Medical History:  Diagnosis Date  . Anxiety   . Arthritis   . Asthma   . Back pain   . Breast cancer ([facility]) 2016   right breast  . Cancer ([facility])    breast  . CHF (congestive heart failure) ([facility])   . Claustrophobia    SEVERE  . Complication of anesthesia    heart issues 2004 during gb surgery had to be revived  . COPD (chronic obstructive pulmonary disease) ([facility])   . Depression   . Diabetes mellitus without complication ([facility])   . GERD (gastroesophageal reflux disease)   . Gout   . Headache   . Hypertension   . Hypothyroidism   . Obesity   . Pain    chronic back  . Shortness of breath dyspnea   . Sleep apnea    could not use cpap uses 1.5 l [place] at night    Past Surgical History:  Procedure Laterality Date  . abd pain    . ABDOMINAL HYSTERECTOMY    . ANKLE FRACTURE SURGERY    . BACK SURGERY     lumbar fusion  . BREAST BIOPSY Left 2004   negative  . CATARACT EXTRACTION W/PHACO  Right 06/28/2017   Procedure: CATARACT EXTRACTION PHACO AND INTRAOCULAR LENS PLACEMENT (IOC);  Surgeon: Birder Robson, MD;  Location: [facility] ORS;  Service: Ophthalmology;  Laterality: Right;  [place] 02:20.6 AP% 20.6 CDE 28.90 Fluid Pack Lot # [id number]  . CHOLECYSTECTOMY    . ESOPHAGOGASTRODUODENOSCOPY (EGD) WITH PROPOFOL N/A 02/06/2016   Procedure: ESOPHAGOGASTRODUODENOSCOPY (EGD) WITH PROPOFOL;  Surgeon: Lollie Sails, MD;  Location: [facility] ENDOSCOPY;  Service: Endoscopy;  Laterality: N/A;  . FRACTURE SURGERY    . MASTECTOMY Right 2016  . OOPHORECTOMY     one ovary remaining  . PARTIAL MASTECTOMY WITH NEEDLE LOCALIZATION Right 09/20/2014   Procedure: PARTIAL MASTECTOMY WITH NEEDLE LOCALIZATION;  Surgeon: Leonie Green, MD;  Location: [facility] ORS;  Service: General;  Laterality: Right;  . SENTINEL NODE BIOPSY Right 09/20/2014   Procedure: SENTINEL NODE BIOPSY;  Surgeon: Leonie Green, MD;  Location: [facility] ORS;  Service: General;  Laterality: Right;    Family History  Problem Relation Age of Onset  . Breast cancer Paternal Aunt        67's  . Breast cancer Maternal Aunt 68  . Breast cancer Maternal Aunt 67  . Colon cancer Mother   . Lung cancer Father   . Prostate cancer Neg  Hx   . Kidney cancer Neg Hx   . Bladder Cancer Neg Hx     Social History:  reports that she has quit smoking. Her smoking use included e-cigarettes. She has never used smokeless tobacco. She reports that she drank alcohol. She reports that she does not use drugs.  She drinks alcohol rarely.  She is disabled secondary to her back.  She previously worked with developmentally disabled adults for 28 years.  She lives in [place].  The patient is accompanied by her husband, [name], today.  Allergies:  Allergies  Allergen Reactions  . Aspirin Nausea Only and Other (See Comments)    Stomach burning  . Furosemide Palpitations  . Latex Itching, Rash and Other (See Comments)    SNEEZING  . Meloxicam Nausea Only   . Nsaids Nausea Only  . Tape Rash    Current Medications: Current Outpatient Medications  Medication Sig Dispense Refill  . albuterol (VENTOLIN HFA) 108 (90 Base) MCG/ACT inhaler Inhale 2 puffs into the lungs every 6 (six) hours as needed for wheezing or shortness of breath.     . calcium carbonate (CALCIUM 600) 600 MG TABS tablet Take 1 tablet (600 mg total) by mouth 2 (two) times daily with a meal. 60 tablet 6  . Cholecalciferol (VITAMIN D3) 2000 UNITS capsule Take 2,000 Units by mouth daily.     . enalapril (VASOTEC) 20 MG tablet Take 20 mg by mouth every morning.     Marland Kitchen exemestane (AROMASIN) 25 MG tablet TAKE 1 TABLET BY MOUTH ONCE DAILY AFTER BREAKFAST 90 tablet 3  . gabapentin (NEURONTIN) 300 MG capsule Take 300 mg by mouth 3 (three) times daily as needed (for back pain).     Marland Kitchen glimepiride (AMARYL) 4 MG tablet Take 4 mg by mouth daily with breakfast.     . guaiFENesin (MUCINEX) 600 MG 12 hr tablet Take 600 mg by mouth 2 (two) times daily as needed for cough or to loosen phlegm.     Marland Kitchen HYDROcodone-acetaminophen (NORCO/VICODIN) 5-325 MG per tablet Take 1 tablet by mouth every 6 (six) hours as needed for severe pain.     Marland Kitchen levothyroxine (SYNTHROID, LEVOTHROID) 75 MCG tablet Take 75 mcg by mouth daily before breakfast.     . liraglutide 18 MG/3ML SOPN Inject 0.6 mg into the skin daily.    Marland Kitchen loratadine (CLARITIN) 10 MG tablet Take 10 mg by mouth at bedtime.     . magnesium oxide (MAG-OX) 400 MG tablet TAKE ONE TABLET BY MOUTH TWICE DAILY    . metoCLOPramide (REGLAN) 5 MG tablet Take 5 mg by mouth 4 (four) times daily.    . metoprolol (LOPRESSOR) 50 MG tablet Take 50 mg by mouth 2 (two) times daily.     Marland Kitchen OVER THE COUNTER MEDICATION Take 1 tablet by mouth at bedtime. Alteril Natural Sleep Aid    . pantoprazole (PROTONIX) 40 MG tablet Take 40 mg by mouth daily.    . polyethylene glycol (MIRALAX / GLYCOLAX) packet Take 17 g by mouth 4 (four) times daily as needed for moderate constipation.     . simvastatin (ZOCOR) 80 MG tablet Take 80 mg by mouth daily.      No current facility-administered medications for this visit.     Review of Systems  Constitutional: Positive for weight loss (down 5 pounds). Negative for diaphoresis, fever and malaise/fatigue.       "I feel pretty good"  HENT: Negative.   Eyes: Negative.   Respiratory: Positive for shortness  of breath (exertional). Negative for cough, hemoptysis and sputum production.   Cardiovascular: Negative for chest pain, palpitations, orthopnea, leg swelling and PND.  Gastrointestinal: Negative for abdominal pain, blood in stool, constipation, diarrhea, melena, nausea and vomiting.  Genitourinary: Negative for dysuria, frequency, hematuria and urgency.  Musculoskeletal: Positive for back pain (chronic - radiating down into right leg.). Negative for falls, joint pain and myalgias.  Skin: Negative for itching and rash.  Neurological: Negative for dizziness, tremors, weakness and headaches.  Endo/Heme/Allergies: Does not bruise/bleed easily.  Psychiatric/Behavioral: Negative for depression, memory loss and suicidal ideas. The patient is not nervous/anxious and does not have insomnia.   All other systems reviewed and are negative.  Performance status (ECOG): 2 - Symptomatic, <50% confined to bed  Physical Exam: Blood pressure (!) 169/91, pulse 74, temperature (!) 96.9 F (36.1 C), temperature source Tympanic, resp. rate 20, weight (!) 356 lb 4 oz (161.6 kg). GENERAL:  Well developed, well nourished, heavyset woman sitting comfortably in the exam room in no acute distress. MENTAL STATUS:  Alert and oriented to person, place and time. HEAD: Short black hair.  Normocephalic, atraumatic, face symmetric, no Cushingoid features. EYES:  Brown eyes.  Pupils equal round and reactive to light and accomodation.  No conjunctivitis or scleral icterus. ENT:  Oropharynx clear without lesion.  Tongue normal. Mucous membranes moist.  RESPIRATORY:   Clear to auscultation without rales, wheezes or rhonchi. CARDIOVASCULAR:  Regular rate and rhythm without murmur, rub or gallop. BREAST:  Large breasts.  Right breast without masses, skin changes or nipple discharge.  Left breast without masses, skin changes or nipple discharge. ABDOMEN:  Soft, non-tender, with active bowel sounds, and no hepatosplenomegaly.  No masses. SKIN:  No rashes, ulcers or lesions. EXTREMITIES: Chronic lower extremity changes.  No skin discoloration or tenderness.  No palpable cords. LYMPH NODES: No palpable cervical, supraclavicular, axillary or inguinal adenopathy  NEUROLOGICAL: Unremarkable. PSYCH:  Appropriate.    Orders Only on 08/02/2017  Component Date Value Ref Range Status  . Sodium 08/02/2017 142  135 - 145 mmol/L Final  . Potassium 08/02/2017 3.8  3.5 - 5.1 mmol/L Final  . Chloride 08/02/2017 101  101 - 111 mmol/L Final  . CO2 08/02/2017 28  22 - 32 mmol/L Final  . Glucose, Bld 08/02/2017 112* 65 - 99 mg/dL Final  . BUN 08/02/2017 13  6 - 20 mg/dL Final  . Creatinine, Ser 08/02/2017 1.25* 0.44 - 1.00 mg/dL Final  . Calcium 08/02/2017 9.5  8.9 - 10.3 mg/dL Final  . Total Protein 08/02/2017 7.6  6.5 - 8.1 g/dL Final  . Albumin 08/02/2017 4.1  3.5 - 5.0 g/dL Final  . AST 08/02/2017 20  15 - 41 U/L Final  . ALT 08/02/2017 16  14 - 54 U/L Final  . Alkaline Phosphatase 08/02/2017 68  38 - 126 U/L Final  . Total Bilirubin 08/02/2017 0.6  0.3 - 1.2 mg/dL Final  . GFR calc non Af Amer 08/02/2017 44* >60 mL/min Final  . GFR calc Af Amer 08/02/2017 52* >60 mL/min Final   Comment: (NOTE) The eGFR has been calculated using the CKD EPI equation. This calculation has not been validated in all clinical situations. eGFR's persistently <60 mL/min signify possible Chronic Kidney Disease.   Georgiann Hahn gap 08/02/2017 13  5 - 15 Final   Performed at [facility], [address] [place], [place] [address]  . WBC 08/02/2017 9.7  3.6 - 11.0 K/uL Final  . RBC  08/02/2017 4.89  3.80 - 5.20 MIL/uL Final  . Hemoglobin 08/02/2017 12.8  12.0 - 16.0 g/dL Final  . HCT 08/02/2017 39.3  35.0 - 47.0 % Final  . MCV 08/02/2017 80.4  80.0 - 100.0 fL Final  . MCH 08/02/2017 26.2  26.0 - 34.0 pg Final  . MCHC 08/02/2017 32.6  32.0 - 36.0 g/dL Final  . RDW 08/02/2017 15.1* 11.5 - 14.5 % Final  . Platelets 08/02/2017 242  150 - 440 K/uL Final  . Neutrophils Relative % 08/02/2017 71  % Final  . Neutro Abs 08/02/2017 6.9* 1.4 - 6.5 K/uL Final  . Lymphocytes Relative 08/02/2017 22  % Final  . Lymphs Abs 08/02/2017 2.1  1.0 - 3.6 K/uL Final  . Monocytes Relative 08/02/2017 5  % Final  . Monocytes Absolute 08/02/2017 0.5  0.2 - 0.9 K/uL Final  . Eosinophils Relative 08/02/2017 1  % Final  . Eosinophils Absolute 08/02/2017 0.1  0 - 0.7 K/uL Final  . Basophils Relative 08/02/2017 1  % Final  . Basophils Absolute 08/02/2017 0.1  0 - 0.1 K/uL Final   Performed at [facility], [address] [place], [place] [address]    Assessment:  Teresa Holland is a 67 y.o. female with stage I right breast cancer s/p  partial mastectomy on 09/20/2014.  Pathology revealed a 0.9 cm grade 1 invasive mammary carcinoma. There was no lymphovascular invasion.  Initial caudal margin was positive with invasive carcinoma. Re-resection was negative. One sentinel lymph node was negative.  Tumor was ER+ (> 90%), PR+ (>90%), and Her2/neu 1+.  Pathologic stage was T1bN0.  She did not receive radiation secondary to her morbid conditions. She could not lay flat for radiation.  She started Femara in 10/2014. She switched to Aromasin secondary to poor tolerance of Femara.  Bilateral diagnostic mammogram on 01/17/2017 revealed no evidence of malignancy in either breast. There were lumpectomy changes in the outer right breast.  CA27.29 has been followed: 34.0 on 12/19/2015, 28.3 on 07/01/2016, 19.8 on 01/18/2017, and 20.6 on 08/02/2017.  Bone density on 05/20/2011 was normal with a T-score  of 0.4 in the left femur.  Bone density on 08/30/2016 was normal with a T-score of 0.1 in the left femur.  Bone scan on 01/01/2016 revealed multifocal degenerative changes without evidence of metastatic disease.  She has microcytic RBC indices.  Labs on 07/01/2016 revealed a ferritin 133, iron saturation 15% and TIBC 337.  Symptomatically, she is doing well.  Exam is stable. Labs are unremarkable.   Plan: 1. Labs today:  CBC with diff, CMP, CA27.29. 2. Schedule bilateral mammogram on 01/17/2018. 3. Continue Aromasin.  4. Discuss calcium 1200 mg and vitamin D 800 IU daily. 5. RTC in 6 months for MD assessment and labs (CBC with diff, CMP, CA27.29).   Honor Loh, NP  08/02/2017, 3:31 PM   I saw and evaluated the patient, participating in the key portions of the service and reviewing pertinent diagnostic studies and records.  I reviewed the nurse practitioner's note and agree with the findings and the plan.  The assessment and plan were discussed with the patient.  Several questions were asked by the patient and answered.   Nolon Stalls, MD 08/02/2017,3:31 PM

## 2017-08-02 NOTE — Progress Notes (Signed)
Patient offers no complaints today. 

## 2017-08-03 LAB — CANCER ANTIGEN 27.29: CA 27.29: 20.6 U/mL (ref 0.0–38.6)

## 2017-08-03 MED FILL — EXEMESTANE 25 MG TABLET: 25 | 90 days supply | Qty: 90 | Fill #1

## 2017-08-28 ENCOUNTER — Encounter: Payer: Self-pay | Admitting: Hematology and Oncology

## 2017-09-06 ENCOUNTER — Encounter: Payer: Self-pay | Admitting: *Deleted

## 2017-09-07 ENCOUNTER — Ambulatory Visit: Payer: Medicare PPO | Admitting: Anesthesiology

## 2017-09-07 ENCOUNTER — Encounter
Admission: RE | Disposition: A | Discharge status: Home / Self Care | Payer: Self-pay | Source: Ambulatory Visit | Attending: Internal Medicine

## 2017-09-07 ENCOUNTER — Other Ambulatory Visit: Payer: Self-pay

## 2017-09-07 ENCOUNTER — Ambulatory Visit
Admission: RE | Admit: 2017-09-07 | Discharge: 2017-09-07 | Disposition: A | Discharge status: Home / Self Care | Payer: Medicare PPO | Source: Ambulatory Visit | Attending: Internal Medicine | Admitting: Internal Medicine

## 2017-09-07 DIAGNOSIS — J45998 Other asthma: Secondary | ICD-10-CM | POA: Insufficient documentation

## 2017-09-07 DIAGNOSIS — J449 Chronic obstructive pulmonary disease, unspecified: Secondary | ICD-10-CM | POA: Insufficient documentation

## 2017-09-07 DIAGNOSIS — F419 Anxiety disorder, unspecified: Secondary | ICD-10-CM | POA: Insufficient documentation

## 2017-09-07 DIAGNOSIS — Z91040 Latex allergy status: Secondary | ICD-10-CM | POA: Insufficient documentation

## 2017-09-07 DIAGNOSIS — E039 Hypothyroidism, unspecified: Secondary | ICD-10-CM | POA: Insufficient documentation

## 2017-09-07 DIAGNOSIS — F40240 Claustrophobia: Secondary | ICD-10-CM | POA: Insufficient documentation

## 2017-09-07 DIAGNOSIS — G4730 Sleep apnea, unspecified: Secondary | ICD-10-CM | POA: Diagnosis not present

## 2017-09-07 DIAGNOSIS — J45909 Unspecified asthma, uncomplicated: Secondary | ICD-10-CM | POA: Insufficient documentation

## 2017-09-07 DIAGNOSIS — K279 Peptic ulcer, site unspecified, unspecified as acute or chronic, without hemorrhage or perforation: Secondary | ICD-10-CM | POA: Insufficient documentation

## 2017-09-07 DIAGNOSIS — M549 Dorsalgia, unspecified: Secondary | ICD-10-CM | POA: Diagnosis not present

## 2017-09-07 DIAGNOSIS — Z886 Allergy status to analgesic agent status: Secondary | ICD-10-CM | POA: Insufficient documentation

## 2017-09-07 DIAGNOSIS — K219 Gastro-esophageal reflux disease without esophagitis: Secondary | ICD-10-CM | POA: Diagnosis not present

## 2017-09-07 DIAGNOSIS — Z86010 Personal history of colonic polyps: Secondary | ICD-10-CM | POA: Diagnosis not present

## 2017-09-07 DIAGNOSIS — G8929 Other chronic pain: Secondary | ICD-10-CM | POA: Insufficient documentation

## 2017-09-07 DIAGNOSIS — Z6841 Body Mass Index (BMI) 40.0 and over, adult: Secondary | ICD-10-CM | POA: Insufficient documentation

## 2017-09-07 DIAGNOSIS — Z9841 Cataract extraction status, right eye: Secondary | ICD-10-CM | POA: Diagnosis not present

## 2017-09-07 DIAGNOSIS — M1990 Unspecified osteoarthritis, unspecified site: Secondary | ICD-10-CM | POA: Insufficient documentation

## 2017-09-07 DIAGNOSIS — Z1211 Encounter for screening for malignant neoplasm of colon: Secondary | ICD-10-CM | POA: Insufficient documentation

## 2017-09-07 DIAGNOSIS — K640 First degree hemorrhoids: Secondary | ICD-10-CM | POA: Insufficient documentation

## 2017-09-07 DIAGNOSIS — D120 Benign neoplasm of cecum: Secondary | ICD-10-CM | POA: Insufficient documentation

## 2017-09-07 DIAGNOSIS — Z853 Personal history of malignant neoplasm of breast: Secondary | ICD-10-CM | POA: Diagnosis not present

## 2017-09-07 DIAGNOSIS — I509 Heart failure, unspecified: Secondary | ICD-10-CM | POA: Insufficient documentation

## 2017-09-07 DIAGNOSIS — E669 Obesity, unspecified: Secondary | ICD-10-CM | POA: Insufficient documentation

## 2017-09-07 DIAGNOSIS — I110 Hypertensive heart disease with heart failure: Secondary | ICD-10-CM | POA: Diagnosis not present

## 2017-09-07 DIAGNOSIS — E119 Type 2 diabetes mellitus without complications: Secondary | ICD-10-CM | POA: Diagnosis not present

## 2017-09-07 DIAGNOSIS — Z91048 Other nonmedicinal substance allergy status: Secondary | ICD-10-CM | POA: Insufficient documentation

## 2017-09-07 DIAGNOSIS — Z7984 Long term (current) use of oral hypoglycemic drugs: Secondary | ICD-10-CM | POA: Insufficient documentation

## 2017-09-07 DIAGNOSIS — K5900 Constipation, unspecified: Secondary | ICD-10-CM | POA: Insufficient documentation

## 2017-09-07 DIAGNOSIS — Z888 Allergy status to other drugs, medicaments and biological substances status: Secondary | ICD-10-CM | POA: Insufficient documentation

## 2017-09-07 DIAGNOSIS — Z7951 Long term (current) use of inhaled steroids: Secondary | ICD-10-CM | POA: Insufficient documentation

## 2017-09-07 DIAGNOSIS — Z79899 Other long term (current) drug therapy: Secondary | ICD-10-CM | POA: Insufficient documentation

## 2017-09-07 DIAGNOSIS — Z9049 Acquired absence of other specified parts of digestive tract: Secondary | ICD-10-CM | POA: Insufficient documentation

## 2017-09-07 HISTORY — PX: COLONOSCOPY WITH PROPOFOL: SHX5780

## 2017-09-07 HISTORY — DX: Duodenitis without bleeding: K29.80

## 2017-09-07 HISTORY — DX: Other gastritis without bleeding: K29.60

## 2017-09-07 HISTORY — DX: Constipation, unspecified: K59.00

## 2017-09-07 LAB — GLUCOSE, CAPILLARY: Glucose-Capillary: 86 mg/dL (ref 70–99)

## 2017-09-07 SURGERY — COLONOSCOPY WITH PROPOFOL
Anesthesia: General

## 2017-09-07 MED ORDER — FENTANYL CITRATE (PF) 100 MCG/2ML IJ SOLN
INTRAMUSCULAR | Status: AC
  Filled 2017-09-07: qty 2

## 2017-09-07 MED ORDER — PROPOFOL 500 MG/50ML IV EMUL
INTRAVENOUS | Status: DC | PRN
  Administered 2017-09-07: 120 ug/kg/min via INTRAVENOUS

## 2017-09-07 MED ORDER — MIDAZOLAM HCL 2 MG/2ML IJ SOLN
INTRAMUSCULAR | Status: AC
  Filled 2017-09-07: qty 2

## 2017-09-07 MED ORDER — FENTANYL CITRATE (PF) 100 MCG/2ML IJ SOLN
INTRAMUSCULAR | Status: DC | PRN
  Administered 2017-09-07 (×2): 25 ug via INTRAVENOUS
  Administered 2017-09-07: 50 ug via INTRAVENOUS

## 2017-09-07 MED ORDER — MIDAZOLAM HCL 2 MG/2ML IJ SOLN
INTRAMUSCULAR | Status: DC | PRN
  Administered 2017-09-07: 2 mg via INTRAVENOUS

## 2017-09-07 MED ORDER — PROPOFOL 500 MG/50ML IV EMUL
INTRAVENOUS | Status: AC
  Filled 2017-09-07: qty 50

## 2017-09-07 MED ORDER — SODIUM CHLORIDE 0.9 % IV SOLN
INTRAVENOUS | Status: DC
  Administered 2017-09-07: 14:00:00 via INTRAVENOUS

## 2017-09-07 NOTE — Transfer of Care (Signed)
Immediate Anesthesia Transfer of Care Note  Patient: Teresa Holland  Procedure(s) Performed: COLONOSCOPY WITH PROPOFOL (N/A )  Patient Location: PACU  Anesthesia Type:General  Level of Consciousness: awake and sedated  Airway & Oxygen Therapy: Patient Spontanous Breathing and Patient connected to face mask oxygen  Post-op Assessment: Report given to RN and Post -op Vital signs reviewed and stable  Post vital signs: Reviewed and stable  Last Vitals:  Vitals Value Taken Time  BP    Temp    Pulse    Resp    SpO2      Last Pain:  Vitals:   09/07/17 1343  PainSc: 8          Complications: No apparent anesthesia complications

## 2017-09-07 NOTE — Anesthesia Procedure Notes (Signed)
Performed by: Vaughan Sine Pre-anesthesia Checklist: Patient identified, Emergency Drugs available, Suction available, Patient being monitored and Timeout performed Patient Re-evaluated:Patient Re-evaluated prior to induction Oxygen Delivery Method: Simple face mask Preoxygenation: Pre-oxygenation with 100% oxygen Induction Type: IV induction Ventilation: Oral airway inserted - appropriate to patient size Airway Equipment and Method: Oral airway Placement Confirmation: positive ETCO2 and CO2 detector

## 2017-09-07 NOTE — Anesthesia Preprocedure Evaluation (Addendum)
Anesthesia Evaluation  Patient identified by MRN, date of birth, ID band Patient awake    Reviewed: Allergy & Precautions, H&P , NPO status , reviewed documented beta blocker date and time   History of Anesthesia Complications (+) history of anesthetic complications  Airway Mallampati: III  TM Distance: >3 FB Neck ROM: full    Dental  (+) Upper Dentures, Lower Dentures   Pulmonary shortness of breath, asthma , sleep apnea and Continuous Positive Airway Pressure Ventilation , COPD, former smoker,  Counseled to use CPAP tonight   Pulmonary exam normal        Cardiovascular hypertension, +CHF  Normal cardiovascular exam     Neuro/Psych  Headaches, Anxiety    GI/Hepatic PUD, GERD  ,  Endo/Other  diabetesHypothyroidism Morbid obesity  Renal/GU      Musculoskeletal  (+) Arthritis ,   Abdominal   Peds  Hematology   Anesthesia Other Findings Past Medical History: No date: Anxiety No date: Arthritis No date: Asthma No date: Back pain 2016: Breast cancer ([place])     Comment:  right breast No date: Cancer [facility])     Comment:  breast No date: CHF (congestive heart failure) ([place]) No date: Claustrophobia     Comment:  SEVERE No date: Complication of anesthesia     Comment:  heart issues 2004 during gb surgery had to be revived No date: Constipation No date: COPD (chronic obstructive pulmonary disease) ([facility]) No date: Diabetes mellitus without complication ([facility]) No date: Duodenitis No date: Erosive gastritis No date: GERD (gastroesophageal reflux disease) No date: Gout No date: Headache No date: Hypertension No date: Hypothyroidism No date: Obesity No date: Pain     Comment:  chronic back No date: Shortness of breath dyspnea No date: Sleep apnea     Comment:  could not use cpap uses 1.5 l [place] at night  Past Surgical History: No date: abd pain No date: ABDOMINAL HYSTERECTOMY No date: ANKLE FRACTURE SURGERY No  date: BACK SURGERY     Comment:  lumbar fusion 2004: BREAST BIOPSY; Left     Comment:  negative 06/28/2017: CATARACT EXTRACTION W/PHACO; Right     Comment:  Procedure: CATARACT EXTRACTION PHACO AND INTRAOCULAR               LENS PLACEMENT (IOC);  Surgeon: Birder Robson, MD;                Location: [facility] ORS;  Service: Ophthalmology;  Laterality:              Right;  [place] 02:20.6 AP% 20.6 CDE 28.90 Fluid Pack Lot #              [id number] No date: CHOLECYSTECTOMY No date: COLONOSCOPY 02/06/2016: ESOPHAGOGASTRODUODENOSCOPY (EGD) WITH PROPOFOL; N/A     Comment:  Procedure: ESOPHAGOGASTRODUODENOSCOPY (EGD) WITH               PROPOFOL;  Surgeon: Lollie Sails, MD;  Location:               [facility] ENDOSCOPY;  Service: Endoscopy;  Laterality: N/A; No date: FRACTURE SURGERY; Right     Comment:  ankle 2016: MASTECTOMY; Right No date: OOPHORECTOMY     Comment:  one ovary remaining 09/20/2014: PARTIAL MASTECTOMY WITH NEEDLE LOCALIZATION; Right     Comment:  Procedure: PARTIAL MASTECTOMY WITH NEEDLE LOCALIZATION;               Surgeon: Leonie Green, MD;  Location: [facility] ORS;  Service: General;  Laterality: Right; 09/20/2014: SENTINEL NODE BIOPSY; Right     Comment:  Procedure: SENTINEL NODE BIOPSY;  Surgeon: Leonie Green, MD;  Location: [facility] ORS;  Service: General;                Laterality: Right; No date: TONSILLECTOMY     Reproductive/Obstetrics                            Anesthesia Physical Anesthesia Plan  ASA: IV  Anesthesia Plan: General   Post-op Pain Management:    Induction:   PONV Risk Score and Plan: 3 and Treatment may vary due to age or medical condition and TIVA  Airway Management Planned:   Additional Equipment:   Intra-op Plan:   Post-operative Plan:   Informed Consent: I have reviewed the patients History and Physical, chart, labs and discussed the procedure including the risks, benefits and  alternatives for the proposed anesthesia with the patient or authorized representative who has indicated his/her understanding and acceptance.   Dental Advisory Given  Plan Discussed with: CRNA  Anesthesia Plan Comments:         Anesthesia Quick Evaluation

## 2017-09-07 NOTE — Interval H&P Note (Signed)
History and Physical Interval Note:  1/61/0960 4:54 PM  Teresa Holland  has presented today for surgery, with the diagnosis of Personal History Colon Polyps  The various methods of treatment have been discussed with the patient and family. After consideration of risks, benefits and other options for treatment, the patient has consented to  Procedure(s): COLONOSCOPY WITH PROPOFOL (N/A) as a surgical intervention .  The patient's history has been reviewed, patient examined, no change in status, stable for surgery.  I have reviewed the patient's chart and labs.  Questions were answered to the patient's satisfaction.     [place], [place]

## 2017-09-07 NOTE — Op Note (Signed)
[facility] Gastroenterology Patient Name: Markeita Alicia Procedure Date: 09/07/2017 2:17 PM MRN: [id number] Account #: [id number] Date of Birth: [birth year] Admit Type: Outpatient Age: 67 Room: [facility] ENDO ROOM 4 Gender: Female Note Status: Finalized Procedure:            Colonoscopy Indications:          High risk colon cancer surveillance: Personal history                        of colonic polyps Providers:            Benay Pike. Alice Reichert MD, MD Referring MD:         Rusty Aus, MD (Referring MD) Medicines:            Propofol per Anesthesia Complications:        No immediate complications. Procedure:            Pre-Anesthesia Assessment:                       - ASA Grade Assessment: IV - A patient with severe                        systemic disease that is a constant threat to life.                       - After reviewing the risks and benefits, the patient                        was deemed in satisfactory condition to undergo the                        procedure.                       After obtaining informed consent, the colonoscope was                        passed under direct vision. Throughout the procedure,                        the patient's blood pressure, pulse, and oxygen                        saturations were monitored continuously. The                        Colonoscope was introduced through the anus and                        advanced to the the cecum, identified by appendiceal                        orifice and ileocecal valve. The colonoscopy was                        performed without difficulty. The patient tolerated the                        procedure well. The quality of the bowel preparation  was adequate. The ileocecal valve, appendiceal orifice,                        and rectum were photographed. Findings:      The perianal and digital rectal examinations were normal. Pertinent       negatives include normal  sphincter tone and no palpable rectal lesions.      A 5 mm polyp was found in the cecum. The polyp was sessile. The polyp       was removed with a cold snare. Resection and retrieval were complete.      A 4 mm polyp was found in the ileocecal valve. The polyp was sessile.       The polyp was removed with a jumbo cold forceps. Resection and retrieval       were complete.      Non-bleeding internal hemorrhoids were found during retroflexion. The       hemorrhoids were Grade I (internal hemorrhoids that do not prolapse).      The exam was otherwise without abnormality. Impression:           - One 5 mm polyp in the cecum, removed with a cold                        snare. Resected and retrieved.                       - One 4 mm polyp at the ileocecal valve, removed with a                        jumbo cold forceps. Resected and retrieved.                       - Non-bleeding internal hemorrhoids.                       - The examination was otherwise normal. Recommendation:       - Patient has a contact number available for                        emergencies. The signs and symptoms of potential                        delayed complications were discussed with the patient.                        Return to normal activities tomorrow. Written discharge                        instructions were provided to the patient.                       - Resume previous diet.                       - Continue present medications.                       - Await pathology results.                       - Repeat colonoscopy is recommended for surveillance.  The colonoscopy date will be determined after pathology                        results from today's exam become available for review.                       - Return to GI office PRN.                       - The findings and recommendations were discussed with                        the patient and their family. Procedure Code(s):    ---  Professional ---                       [phone], Colonoscopy, flexible; with removal of tumor(s),                        polyp(s), or other lesion(s) by snare technique                       [id number], 43, Colonoscopy, flexible; with biopsy, single                        or multiple Diagnosis Code(s):    --- Professional ---                       K64.0, First degree hemorrhoids                       D12.0, Benign neoplasm of cecum                       Z86.010, Personal history of colonic polyps CPT copyright 2017 [employer]. All rights reserved. The codes documented in this report are preliminary and upon coder review may  be revised to meet current compliance requirements. Efrain Sella MD, MD 09/07/2017 3:04:44 PM This report has been signed electronically. Number of Addenda: 0 Note Initiated On: 09/07/2017 2:17 PM Scope Withdrawal Time: 0 hours 12 minutes 23 seconds  Total Procedure Duration: 0 hours 16 minutes 8 seconds       [facility]

## 2017-09-07 NOTE — Anesthesia Post-op Follow-up Note (Signed)
Anesthesia QCDR form completed.        

## 2017-09-07 NOTE — H&P (Signed)
Outpatient short stay form Pre-procedure 09/07/2017 2:18 PM Teodoro K. Alice Reichert, M.D.  Primary Physician: Emily Filbert, M.D.  Reason for visit:  Colon cancer screening.  History of present illness:  Patient presents for colonoscopy for colon polyp surveillance. The patient denies complaints of abdominal pain, or rectal bleeding. Patient has a hx of chronic constipation Last colonoscopy in 2014 for [facility] colon polyps.    Current Facility-Administered Medications:  .  0.9 %  sodium chloride infusion, , Intravenous, Continuous, Perry, Benay Pike, MD, Last Rate: 20 mL/hr at 09/07/17 1350  Medications Prior to Admission  Medication Sig Dispense Refill Last Dose  . albuterol (VENTOLIN HFA) 108 (90 Base) MCG/ACT inhaler Inhale 2 puffs into the lungs every 6 (six) hours as needed for wheezing or shortness of breath.    Taking  . calcium carbonate (CALCIUM 600) 600 MG TABS tablet Take 1 tablet (600 mg total) by mouth 2 (two) times daily with a meal. 60 tablet 6 09/06/2017 at Unknown time  . Cholecalciferol (VITAMIN D3) 2000 UNITS capsule Take 2,000 Units by mouth daily.    09/06/2017 at Unknown time  . dicyclomine (BENTYL) 10 MG capsule Take 10 mg by mouth 4 (four) times daily as needed for spasms.   Past Month at Unknown time  . enalapril (VASOTEC) 20 MG tablet Take 20 mg by mouth every morning.    09/06/2017 at Unknown time  . exemestane (AROMASIN) 25 MG tablet TAKE 1 TABLET BY MOUTH ONCE DAILY AFTER BREAKFAST 90 tablet 3 09/06/2017 at Unknown time  . gabapentin (NEURONTIN) 300 MG capsule Take 300 mg by mouth 3 (three) times daily as needed (for back pain).    09/07/2017 at 0730  . glimepiride (AMARYL) 4 MG tablet Take 4 mg by mouth daily with breakfast.    09/06/2017 at Unknown time  . guaiFENesin (MUCINEX) 600 MG 12 hr tablet Take 600 mg by mouth 2 (two) times daily as needed for cough or to loosen phlegm.    09/05/17 at Unknown time  . levothyroxine (SYNTHROID, LEVOTHROID) 75 MCG tablet Take 75 mcg by mouth daily  before breakfast.    09/06/2017 at Unknown time  . linaclotide (LINZESS) 145 MCG CAPS capsule Take 145 mcg by mouth daily before breakfast.   09/06/2017 at Unknown time  . liraglutide 18 MG/3ML SOPN Inject 0.6 mg into the skin daily.   09/06/2017 at Unknown time  . loratadine (CLARITIN) 10 MG tablet Take 10 mg by mouth at bedtime.    09/06/2017 at Unknown time  . magnesium oxide (MAG-OX) 400 MG tablet TAKE ONE TABLET BY MOUTH TWICE DAILY   09/06/2017 at Unknown time  . metoCLOPramide (REGLAN) 5 MG tablet Take 5 mg by mouth 4 (four) times daily.   09/06/2017 at Unknown time  . metoprolol (LOPRESSOR) 50 MG tablet Take 50 mg by mouth 2 (two) times daily.    09/06/2017 at 0200  . simvastatin (ZOCOR) 80 MG tablet Take 80 mg by mouth daily.    09/06/2017 at Unknown time  . etodolac (LODINE) 400 MG tablet Take 400 mg by mouth 2 (two) times daily as needed.   Not Taking at Unknown time  . HYDROcodone-acetaminophen (NORCO/VICODIN) 5-325 MG per tablet Take 1 tablet by mouth every 6 (six) hours as needed for severe pain.    Taking  . OVER THE COUNTER MEDICATION Take 1 tablet by mouth at bedtime. Alteril Natural Sleep Aid   Taking  . pantoprazole (PROTONIX) 40 MG tablet Take 40 mg by mouth daily.  09/05/17  . polyethylene glycol (MIRALAX / GLYCOLAX) packet Take 17 g by mouth 4 (four) times daily as needed for moderate constipation.   Taking  . potassium chloride (KLOR-CON) 8 MEQ tablet Take 10 mEq by mouth daily.   Not Taking at Unknown time     Allergies  Allergen Reactions  . Aspirin Nausea Only and Other (See Comments)    Stomach burning  . Furosemide Palpitations  . Latex Itching, Rash and Other (See Comments)    SNEEZING  . Meloxicam Nausea Only  . Nsaids Nausea Only  . Tape Rash     Past Medical History:  Diagnosis Date  . Anxiety   . Arthritis   . Asthma   . Back pain   . Breast cancer ([place]) 2016   right breast  . Cancer ([place])    breast  . CHF (congestive heart failure) ([place])   . Claustrophobia     SEVERE  . Complication of anesthesia    heart issues 2004 during gb surgery had to be revived  . Constipation   . COPD (chronic obstructive pulmonary disease) ([place])   . Diabetes mellitus without complication ([place])   . Duodenitis   . Erosive gastritis   . GERD (gastroesophageal reflux disease)   . Gout   . Headache   . Hypertension   . Hypothyroidism   . Obesity   . Pain    chronic back  . Shortness of breath dyspnea   . Sleep apnea    could not use cpap uses 1.5 l [place] at night    Review of systems:  Otherwise negative.    Physical Exam  Gen: Alert, oriented. Appears stated age.  HEENT: [place]/AT. PERRLA. Lungs: CTA, no wheezes. CV: RR nl S1, S2. Abd: soft, benign, no masses. BS+ Ext: No edema. Pulses 2+    Planned procedures: Proceed with colonoscopy. The patient understands the nature of the planned procedure, indications, risks, alternatives and potential complications including but not limited to bleeding, infection, perforation, damage to internal organs and possible oversedation/side effects from anesthesia. The patient agrees and gives consent to proceed.  Please refer to procedure notes for findings, recommendations and patient disposition/instructions.     Teodoro K. Alice Reichert, M.D. Gastroenterology 09/07/2017  2:18 PM

## 2017-09-08 NOTE — Anesthesia Postprocedure Evaluation (Signed)
Anesthesia Post Note  Patient: Teresa Holland  Procedure(s) Performed: COLONOSCOPY WITH PROPOFOL (N/A )  Patient location during evaluation: Endoscopy Anesthesia Type: General Level of consciousness: awake and alert Pain management: pain level controlled Vital Signs Assessment: post-procedure vital signs reviewed and stable Respiratory status: spontaneous breathing, nonlabored ventilation and respiratory function stable Cardiovascular status: blood pressure returned to baseline and stable Postop Assessment: no apparent nausea or vomiting Anesthetic complications: no     Last Vitals:  Vitals:   09/07/17 1343 09/07/17 1500  BP: (!) 165/88 (!) 104/59  Pulse:  65  Resp: 20 20  Temp: (!) 36.1 C (!) 36.2 C  SpO2: 99% 99%    Last Pain:  Vitals:   09/07/17 1343  PainSc: 8                  Jerian Morais Harvie Heck

## 2017-09-09 LAB — SURGICAL PATHOLOGY

## 2017-09-11 ENCOUNTER — Encounter: Payer: Self-pay | Admitting: Internal Medicine

## 2017-11-01 MED FILL — EXEMESTANE 25 MG TABS: 25 | 90 days supply | Qty: 90 | Fill #2

## 2018-01-18 ENCOUNTER — Ambulatory Visit
Admission: RE | Admit: 2018-01-18 | Discharge: 2018-01-18 | Disposition: A | Discharge status: Home / Self Care | Payer: Medicare PPO | Source: Ambulatory Visit | Attending: Urgent Care | Admitting: Urgent Care

## 2018-01-18 DIAGNOSIS — C50511 Malignant neoplasm of lower-outer quadrant of right female breast: Secondary | ICD-10-CM | POA: Insufficient documentation

## 2018-02-02 ENCOUNTER — Other Ambulatory Visit: Payer: Medicare PPO

## 2018-02-02 ENCOUNTER — Ambulatory Visit: Payer: Medicare PPO | Admitting: Hematology and Oncology

## 2018-02-06 MED FILL — EXEMESTANE 25 MG TABS: 25 | 90 days supply | Qty: 90 | Fill #3

## 2018-02-23 DIAGNOSIS — T402X5A Adverse effect of other opioids, initial encounter: Secondary | ICD-10-CM | POA: Insufficient documentation

## 2018-02-23 DIAGNOSIS — K5903 Drug induced constipation: Secondary | ICD-10-CM | POA: Insufficient documentation

## 2018-02-23 DIAGNOSIS — E1169 Type 2 diabetes mellitus with other specified complication: Secondary | ICD-10-CM | POA: Insufficient documentation

## 2018-03-15 ENCOUNTER — Inpatient Hospital Stay: Payer: Medicare PPO | Attending: Hematology and Oncology

## 2018-03-15 ENCOUNTER — Inpatient Hospital Stay (HOSPITAL_BASED_OUTPATIENT_CLINIC_OR_DEPARTMENT_OTHER): Payer: Medicare PPO | Admitting: Oncology

## 2018-03-15 ENCOUNTER — Other Ambulatory Visit: Payer: Self-pay

## 2018-03-15 ENCOUNTER — Encounter: Payer: Self-pay | Admitting: Oncology

## 2018-03-15 VITALS — BP 159/90 | HR 67 | Temp 96.8°F | Resp 18 | Wt 357.4 lb

## 2018-03-15 DIAGNOSIS — M109 Gout, unspecified: Secondary | ICD-10-CM

## 2018-03-15 DIAGNOSIS — F419 Anxiety disorder, unspecified: Secondary | ICD-10-CM

## 2018-03-15 DIAGNOSIS — I509 Heart failure, unspecified: Secondary | ICD-10-CM

## 2018-03-15 DIAGNOSIS — Z79899 Other long term (current) drug therapy: Secondary | ICD-10-CM

## 2018-03-15 DIAGNOSIS — Z923 Personal history of irradiation: Secondary | ICD-10-CM | POA: Insufficient documentation

## 2018-03-15 DIAGNOSIS — G4730 Sleep apnea, unspecified: Secondary | ICD-10-CM

## 2018-03-15 DIAGNOSIS — Z170 Estrogen receptor positive status [ER+]: Secondary | ICD-10-CM | POA: Diagnosis not present

## 2018-03-15 DIAGNOSIS — I110 Hypertensive heart disease with heart failure: Secondary | ICD-10-CM

## 2018-03-15 DIAGNOSIS — Z87891 Personal history of nicotine dependence: Secondary | ICD-10-CM | POA: Insufficient documentation

## 2018-03-15 DIAGNOSIS — Z801 Family history of malignant neoplasm of trachea, bronchus and lung: Secondary | ICD-10-CM

## 2018-03-15 DIAGNOSIS — M549 Dorsalgia, unspecified: Secondary | ICD-10-CM | POA: Diagnosis not present

## 2018-03-15 DIAGNOSIS — Z7984 Long term (current) use of oral hypoglycemic drugs: Secondary | ICD-10-CM | POA: Diagnosis not present

## 2018-03-15 DIAGNOSIS — R232 Flushing: Secondary | ICD-10-CM | POA: Insufficient documentation

## 2018-03-15 DIAGNOSIS — Z800 Family history of malignant neoplasm of digestive organs: Secondary | ICD-10-CM | POA: Insufficient documentation

## 2018-03-15 DIAGNOSIS — C50511 Malignant neoplasm of lower-outer quadrant of right female breast: Secondary | ICD-10-CM

## 2018-03-15 DIAGNOSIS — J449 Chronic obstructive pulmonary disease, unspecified: Secondary | ICD-10-CM | POA: Diagnosis not present

## 2018-03-15 DIAGNOSIS — M129 Arthropathy, unspecified: Secondary | ICD-10-CM | POA: Diagnosis not present

## 2018-03-15 DIAGNOSIS — D72829 Elevated white blood cell count, unspecified: Secondary | ICD-10-CM | POA: Insufficient documentation

## 2018-03-15 DIAGNOSIS — Z79811 Long term (current) use of aromatase inhibitors: Secondary | ICD-10-CM

## 2018-03-15 DIAGNOSIS — E039 Hypothyroidism, unspecified: Secondary | ICD-10-CM | POA: Insufficient documentation

## 2018-03-15 DIAGNOSIS — E119 Type 2 diabetes mellitus without complications: Secondary | ICD-10-CM | POA: Diagnosis not present

## 2018-03-15 DIAGNOSIS — Z803 Family history of malignant neoplasm of breast: Secondary | ICD-10-CM | POA: Insufficient documentation

## 2018-03-15 DIAGNOSIS — K219 Gastro-esophageal reflux disease without esophagitis: Secondary | ICD-10-CM | POA: Diagnosis not present

## 2018-03-15 LAB — COMPREHENSIVE METABOLIC PANEL
ALT: 15 U/L (ref 0–44)
AST: 16 U/L (ref 15–41)
Albumin: 4.1 g/dL (ref 3.5–5.0)
Alkaline Phosphatase: 65 U/L (ref 38–126)
Anion gap: 8 (ref 5–15)
BUN: 15 mg/dL (ref 8–23)
CO2: 32 mmol/L (ref 22–32)
Calcium: 9.4 mg/dL (ref 8.9–10.3)
Chloride: 102 mmol/L (ref 98–111)
Creatinine, Ser: 1.19 mg/dL — ABNORMAL HIGH (ref 0.44–1.00)
GFR calc Af Amer: 55 mL/min — ABNORMAL LOW (ref >60)
GFR calc non Af Amer: 48 mL/min — ABNORMAL LOW (ref >60)
Glucose, Bld: 99 mg/dL (ref 70–99)
Potassium: 3.8 mmol/L (ref 3.5–5.1)
Sodium: 142 mmol/L (ref 135–145)
Total Bilirubin: 1 mg/dL (ref 0.3–1.2)
Total Protein: 7.7 g/dL (ref 6.5–8.1)

## 2018-03-15 LAB — CBC WITH DIFFERENTIAL/PLATELET
Abs Immature Granulocytes: 0.07 10*3/uL (ref 0.00–0.07)
Basophils Absolute: 0 10*3/uL (ref 0.0–0.1)
Basophils Relative: 0 %
Eosinophils Absolute: 0.1 10*3/uL (ref 0.0–0.5)
Eosinophils Relative: 1 %
HCT: 41.4 % (ref 36.0–46.0)
Hemoglobin: 12.3 g/dL (ref 12.0–15.0)
Immature Granulocytes: 1 %
Lymphocytes Relative: 27 %
Lymphs Abs: 2.8 10*3/uL (ref 0.7–4.0)
MCH: 24.7 pg — ABNORMAL LOW (ref 26.0–34.0)
MCHC: 29.7 g/dL — ABNORMAL LOW (ref 30.0–36.0)
MCV: 83.1 fL (ref 80.0–100.0)
Monocytes Absolute: 0.7 10*3/uL (ref 0.1–1.0)
Monocytes Relative: 6 %
Neutro Abs: 6.9 10*3/uL (ref 1.7–7.7)
Neutrophils Relative %: 65 %
Platelets: 249 10*3/uL (ref 150–400)
RBC: 4.98 MIL/uL (ref 3.87–5.11)
RDW: 15 % (ref 11.5–15.5)
WBC: 10.6 10*3/uL — ABNORMAL HIGH (ref 4.0–10.5)
nRBC: 0 % (ref 0.0–0.2)

## 2018-03-15 NOTE — Progress Notes (Signed)
Patient here for follow up

## 2018-03-15 NOTE — Progress Notes (Signed)
[facility] day:  03/15/2018  Chief Complaint: Teresa Holland is a 68 y.o. female with stage I right breast cancer who is seen for 6 month assessment.  PERTINENT ONCOLOGY HISTORY Patient previously follows up with Dr.Corcoran and switch care to me on 03/15/2018.   # Patient has history of Stage I right breast cancer s/p  partial mastectomy on 09/20/2014.  Pathology revealed a 0.9 cm grade 1 invasive mammary carcinoma. There was no lymphovascular invasion.  Initial caudal margin was positive with invasive carcinoma. Re-resection was negative. One sentinel lymph node was negative.  Tumor was ER+ (> 90%), PR+ (>90%), and Her2/neu 1+.  Pathologic stage was T1bN0.  She did not receive radiation secondary to her morbid conditions. She could not lay flat for radiation.  She started Femara in 10/2014. She switched to Aromasin secondary to poor tolerance of Femara.  Bone density on 05/20/2011 was normal with a T-score of 0.4 in the left femur.  Bone density on 08/30/2016 was normal with a T-score of 0.1 in the left femur. Bilateral diagnostic mammogram on 01/17/2017 revealed no evidence of malignancy in either breast. There were lumpectomy changes in the outer right breast. Bone scan on 01/01/2016 revealed multifocal degenerative changes without evidence of metastatic disease.  CA27.29 has been followed: 34.0 on 12/19/2015, 28.3 on 07/01/2016, 19.8 on 01/18/2017, and 20.6 on 08/02/2017.  Patient reports feeling well at baseline today. She has been taking Aromasin.  Intermittent hot flash. She has chronic back pain. Has no breast concerns today.  Denies any constitutional symptoms.  Weight has been stable, increased 1 pound compared to 6 months ago.   The patient is accompanied by her husband, [name], today. Review of Systems  Constitutional: Negative for appetite change, chills, fatigue and fever.  HENT:   Negative for hearing loss and voice change.    Eyes: Negative for eye problems.  Respiratory: Positive for shortness of breath. Negative for chest tightness and cough.   Cardiovascular: Negative for chest pain.  Gastrointestinal: Negative for abdominal distention, abdominal pain and blood in stool.  Endocrine: Negative for hot flashes.  Genitourinary: Negative for difficulty urinating and frequency.   Musculoskeletal: Positive for back pain. Negative for arthralgias.  Skin: Negative for itching and rash.  Neurological: Negative for extremity weakness.  Hematological: Negative for adenopathy.  Psychiatric/Behavioral: Negative for confusion.     Past Medical History:  Diagnosis Date  . Anxiety   . Arthritis   . Asthma   . Back pain   . Breast cancer ([place]) 2016   right breast  . Cancer ([place])    breast  . CHF (congestive heart failure) ([place])   . Claustrophobia    SEVERE  . Complication of anesthesia    heart issues 2004 during gb surgery had to be revived  . Constipation   . COPD (chronic obstructive pulmonary disease) ([place])   . Diabetes mellitus without complication ([place])   . Duodenitis   . Erosive gastritis   . GERD (gastroesophageal reflux disease)   . Gout   . Headache   . Hypertension   . Hypothyroidism   . Obesity   . Pain    chronic back  . Shortness of breath dyspnea   . Sleep apnea    could not use cpap uses 1.5 l [place] at night    Past Surgical History:  Procedure Laterality Date  . abd pain    . ABDOMINAL HYSTERECTOMY    . ANKLE FRACTURE  SURGERY    . BACK SURGERY     lumbar fusion  . BREAST BIOPSY Left 2004   negative  . BREAST BIOPSY Right 2016   positive- invasive mammary carcinoma  . BREAST LUMPECTOMY Right 2016  . CATARACT EXTRACTION W/PHACO Right 06/28/2017   Procedure: CATARACT EXTRACTION PHACO AND INTRAOCULAR LENS PLACEMENT (IOC);  Surgeon: Birder Robson, MD;  Location: [facility] ORS;  Service: Ophthalmology;  Laterality: Right;  [place] 02:20.6 AP% 20.6 CDE 28.90 Fluid Pack Lot # [id number]  .  CHOLECYSTECTOMY    . COLONOSCOPY    . COLONOSCOPY WITH PROPOFOL N/A 09/07/2017   Procedure: COLONOSCOPY WITH PROPOFOL;  Surgeon: Toledo, Benay Pike, MD;  Location: [facility] ENDOSCOPY;  Service: Gastroenterology;  Laterality: N/A;  . ESOPHAGOGASTRODUODENOSCOPY (EGD) WITH PROPOFOL N/A 02/06/2016   Procedure: ESOPHAGOGASTRODUODENOSCOPY (EGD) WITH PROPOFOL;  Surgeon: Lollie Sails, MD;  Location: [facility] ENDOSCOPY;  Service: Endoscopy;  Laterality: N/A;  . FRACTURE SURGERY Right    ankle  . OOPHORECTOMY     one ovary remaining  . PARTIAL MASTECTOMY WITH NEEDLE LOCALIZATION Right 09/20/2014   Procedure: PARTIAL MASTECTOMY WITH NEEDLE LOCALIZATION;  Surgeon: Leonie Green, MD;  Location: [facility] ORS;  Service: General;  Laterality: Right;  . SENTINEL NODE BIOPSY Right 09/20/2014   Procedure: SENTINEL NODE BIOPSY;  Surgeon: Leonie Green, MD;  Location: [facility] ORS;  Service: General;  Laterality: Right;  . TONSILLECTOMY      Family History  Problem Relation Age of Onset  . Breast cancer Paternal Aunt        61's  . Breast cancer Maternal Aunt 68  . Breast cancer Maternal Aunt 67  . Colon cancer Mother   . Lung cancer Father   . Prostate cancer Neg Hx   . Kidney cancer Neg Hx   . Bladder Cancer Neg Hx    Social History   Socioeconomic History  . Marital status: Married    Spouse name: Not on file  . Number of children: Not on file  . Years of education: Not on file  . Highest education level: Not on file  Occupational History  . Not on file  Social Needs  . Financial resource strain: Not on file  . Food insecurity:    Worry: Not on file    Inability: Not on file  . Transportation needs:    Medical: Not on file    Non-medical: Not on file  Tobacco Use  . Smoking status: Former Smoker    Types: E-cigarettes  . Smokeless tobacco: Never Used  . Tobacco comment: quit 06/14 uses e cigs occasionally  Substance and Sexual Activity  . Alcohol use: Not Currently  . Drug use: No  .  Sexual activity: Not on file  Lifestyle  . Physical activity:    Days per week: Not on file    Minutes per session: Not on file  . Stress: Not on file  Relationships  . Social connections:    Talks on phone: Not on file    Gets together: Not on file    Attends religious service: Not on file    Active member of club or organization: Not on file    Attends meetings of clubs or organizations: Not on file    Relationship status: Not on file  . Intimate partner violence:    Fear of current or ex partner: Not on file    Emotionally abused: Not on file    Physically abused: Not on file    Forced sexual  activity: Not on file  Other Topics Concern  . Not on file  Social History Narrative  . Not on file   Allergies:  Allergies  Allergen Reactions  . Aspirin Nausea Only and Other (See Comments)    Stomach burning  . Furosemide Palpitations  . Latex Itching, Rash and Other (See Comments)    SNEEZING  . Meloxicam Nausea Only  . Nsaids Nausea Only  . Tape Rash    Current Medications: Current Outpatient Medications  Medication Sig Dispense Refill  . albuterol (VENTOLIN HFA) 108 (90 Base) MCG/ACT inhaler Inhale 2 puffs into the lungs every 6 (six) hours as needed for wheezing or shortness of breath.     . calcium carbonate (CALCIUM 600) 600 MG TABS tablet Take 1 tablet (600 mg total) by mouth 2 (two) times daily with a meal. 60 tablet 6  . Cholecalciferol (VITAMIN D3) 2000 UNITS capsule Take 2,000 Units by mouth daily.     Marland Kitchen dicyclomine (BENTYL) 10 MG capsule Take 10 mg by mouth 4 (four) times daily as needed for spasms.    . Dulaglutide 0.75 MG/0.5ML SOPN Inject into the skin.    Marland Kitchen enalapril (VASOTEC) 20 MG tablet Take 20 mg by mouth every morning.     . etodolac (LODINE) 400 MG tablet Take 400 mg by mouth 2 (two) times daily as needed.    Marland Kitchen exemestane (AROMASIN) 25 MG tablet TAKE 1 TABLET BY MOUTH ONCE DAILY AFTER BREAKFAST 90 tablet 3  . gabapentin (NEURONTIN) 300 MG capsule Take  300 mg by mouth 3 (three) times daily as needed (for back pain).     Marland Kitchen glimepiride (AMARYL) 4 MG tablet Take 4 mg by mouth daily with breakfast.     . guaiFENesin (MUCINEX) 600 MG 12 hr tablet Take 600 mg by mouth 2 (two) times daily as needed for cough or to loosen phlegm.     Marland Kitchen HYDROcodone-acetaminophen (NORCO/VICODIN) 5-325 MG per tablet Take 1 tablet by mouth every 6 (six) hours as needed for severe pain.     Marland Kitchen levothyroxine (SYNTHROID, LEVOTHROID) 75 MCG tablet Take 75 mcg by mouth daily before breakfast.     . linaclotide (LINZESS) 145 MCG CAPS capsule Take 145 mcg by mouth daily before breakfast.    . loratadine (CLARITIN) 10 MG tablet Take 10 mg by mouth at bedtime.     . magnesium oxide (MAG-OX) 400 MG tablet TAKE ONE TABLET BY MOUTH TWICE DAILY    . metoCLOPramide (REGLAN) 5 MG tablet Take 5 mg by mouth 4 (four) times daily.    . metoprolol (LOPRESSOR) 50 MG tablet Take 50 mg by mouth 2 (two) times daily.     Marland Kitchen OVER THE COUNTER MEDICATION Take 1 tablet by mouth at bedtime. Alteril Natural Sleep Aid    . pantoprazole (PROTONIX) 40 MG tablet Take 40 mg by mouth daily.    . polyethylene glycol (MIRALAX / GLYCOLAX) packet Take 17 g by mouth 4 (four) times daily as needed for moderate constipation.    . potassium chloride (KLOR-CON) 8 MEQ tablet Take 10 mEq by mouth daily.    . simvastatin (ZOCOR) 80 MG tablet Take 80 mg by mouth daily.     Marland Kitchen liraglutide 18 MG/3ML SOPN Inject 0.6 mg into the skin daily.     No current facility-administered medications for this visit.    Performance status (ECOG): 2 - Symptomatic, <50% confined to bed  Physical Exam: Blood pressure (!) 159/90, pulse 67, temperature (!) 96.8 F (36  C), temperature source Tympanic, resp. rate 18, weight (!) 357 lb 6.4 oz (162.1 kg). Physical Exam  Constitutional: She is oriented to person, place, and time. No distress.  Morbidly obese  HENT:  Head: Normocephalic and atraumatic.  Mouth/Throat: No oropharyngeal exudate.   Eyes: Pupils are equal, round, and reactive to light. EOM are normal. No scleral icterus.  Neck: Normal range of motion. Neck supple.  Cardiovascular: Normal rate and regular rhythm.  No murmur heard. Pulmonary/Chest: Effort normal. No respiratory distress.  Abdominal: Soft. She exhibits no distension. There is no abdominal tenderness.  Musculoskeletal: Normal range of motion.        General: No edema.  Neurological: She is alert and oriented to person, place, and time. No cranial nerve deficit. She exhibits normal muscle tone. Coordination normal.  Skin: Skin is warm and dry. She is not diaphoretic. No erythema.  Psychiatric: Affect normal.  Breast exam was performed in seated and lying down position.  No palpable breast mass bilaterally. No evidence of axillary adenopathy bilaterally.     Appointment on 03/15/2018  Component Date Value Ref Range Status  . Sodium 03/15/2018 142  135 - 145 mmol/L Final  . Potassium 03/15/2018 3.8  3.5 - 5.1 mmol/L Final  . Chloride 03/15/2018 102  98 - 111 mmol/L Final  . CO2 03/15/2018 32  22 - 32 mmol/L Final  . Glucose, Bld 03/15/2018 99  70 - 99 mg/dL Final  . BUN 03/15/2018 15  8 - 23 mg/dL Final  . Creatinine, Ser 03/15/2018 1.19* 0.44 - 1.00 mg/dL Final  . Calcium 03/15/2018 9.4  8.9 - 10.3 mg/dL Final  . Total Protein 03/15/2018 7.7  6.5 - 8.1 g/dL Final  . Albumin 03/15/2018 4.1  3.5 - 5.0 g/dL Final  . AST 03/15/2018 16  15 - 41 U/L Final  . ALT 03/15/2018 15  0 - 44 U/L Final  . Alkaline Phosphatase 03/15/2018 65  38 - 126 U/L Final  . Total Bilirubin 03/15/2018 1.0  0.3 - 1.2 mg/dL Final  . GFR calc non Af Amer 03/15/2018 48* >60 mL/min Final  . GFR calc Af Amer 03/15/2018 55* >60 mL/min Final  . Anion gap 03/15/2018 8  5 - 15 Final   Performed at [facility], [address] [place], [place] [address]  . WBC 03/15/2018 10.6* 4.0 - 10.5 K/uL Final  . RBC 03/15/2018 4.98  3.87 - 5.11 MIL/uL Final  . Hemoglobin 03/15/2018  12.3  12.0 - 15.0 g/dL Final  . HCT 03/15/2018 41.4  36.0 - 46.0 % Final  . MCV 03/15/2018 83.1  80.0 - 100.0 fL Final  . MCH 03/15/2018 24.7* 26.0 - 34.0 pg Final  . MCHC 03/15/2018 29.7* 30.0 - 36.0 g/dL Final  . RDW 03/15/2018 15.0  11.5 - 15.5 % Final  . Platelets 03/15/2018 249  150 - 400 K/uL Final  . nRBC 03/15/2018 0.0  0.0 - 0.2 % Final  . Neutrophils Relative % 03/15/2018 65  % Final  . Neutro Abs 03/15/2018 6.9  1.7 - 7.7 K/uL Final  . Lymphocytes Relative 03/15/2018 27  % Final  . Lymphs Abs 03/15/2018 2.8  0.7 - 4.0 K/uL Final  . Monocytes Relative 03/15/2018 6  % Final  . Monocytes Absolute 03/15/2018 0.7  0.1 - 1.0 K/uL Final  . Eosinophils Relative 03/15/2018 1  % Final  . Eosinophils Absolute 03/15/2018 0.1  0.0 - 0.5 K/uL Final  . Basophils Relative 03/15/2018 0  % Final  .  Basophils Absolute 03/15/2018 0.0  0.0 - 0.1 K/uL Final  . Immature Granulocytes 03/15/2018 1  % Final  . Abs Immature Granulocytes 03/15/2018 0.07  0.00 - 0.07 K/uL Final   Performed at [facility], [address] [place], [place] [address]    Assessment:  HALI BALGOBIN is a 68 y.o. female with Stage I breast cancer presents to follow up  1. Malignant neoplasm of lower-outer quadrant of right breast of female, estrogen receptor positive ([place])   2. Morbid obesity ([facility])   3. Leukocytosis, unspecified type    Labs reviewed and discussed with patient.  No clinical signs of recurrence. Most recent mammogram 01/18/2018 was independently reviewed by me and discussed. Stable exam. No mammographic evidence of malignancy in the bilateral breasts Annual mammogram due in November 2020.   Morbid obesity, life style modification discussed.  Recommend continue Calcium and vitamin D supplements.   Mild leukocytosis, monitor.   RTC in 6 months. Check CBC, CMP CA 27.29  Earlie Server, MD, PhD Hematology Oncology [facility] at [facility] Pager- [phone] 03/15/2018

## 2018-03-16 LAB — CANCER ANTIGEN 27.29: CA 27.29: 24.2 U/mL (ref 0.0–38.6)

## 2018-04-28 ENCOUNTER — Other Ambulatory Visit: Payer: Self-pay | Admitting: Hematology and Oncology

## 2018-04-28 DIAGNOSIS — C50911 Malignant neoplasm of unspecified site of right female breast: Secondary | ICD-10-CM

## 2018-05-04 ENCOUNTER — Telehealth: Payer: Self-pay | Admitting: *Deleted

## 2018-05-04 NOTE — Telephone Encounter (Signed)
Patient called reporting that her grant for Exemestane has expired and her insurance will not cover it without an $80 copay per month. It will cover Letrozole and Anastrozole for very little money. She requests to be changed to one of the other medications.Please advise

## 2018-05-05 ENCOUNTER — Other Ambulatory Visit: Payer: Self-pay | Admitting: Oncology

## 2018-05-05 MED ORDER — ANASTROZOLE 1 MG PO TABS: 1.0000 mg | ORAL_TABLET | Freq: Every day | ORAL | 3 refills | Status: DC

## 2018-05-05 NOTE — Telephone Encounter (Signed)
Ok to Stop Exemestane and switch to Arimidex, Rx sent to pharmacy.

## 2018-05-05 NOTE — Telephone Encounter (Signed)
Call returned to patient, left message on voice mail

## 2018-08-22 ENCOUNTER — Other Ambulatory Visit: Payer: Self-pay | Admitting: Internal Medicine

## 2018-08-22 DIAGNOSIS — M5440 Lumbago with sciatica, unspecified side: Secondary | ICD-10-CM

## 2018-08-22 DIAGNOSIS — G8929 Other chronic pain: Secondary | ICD-10-CM

## 2018-09-01 ENCOUNTER — Other Ambulatory Visit: Payer: Self-pay | Admitting: Oncology

## 2018-09-04 ENCOUNTER — Other Ambulatory Visit: Payer: Self-pay | Admitting: Internal Medicine

## 2018-09-04 DIAGNOSIS — M5440 Lumbago with sciatica, unspecified side: Secondary | ICD-10-CM

## 2018-09-13 ENCOUNTER — Inpatient Hospital Stay: Payer: Medicare PPO | Admitting: Oncology

## 2018-09-13 ENCOUNTER — Inpatient Hospital Stay: Payer: Medicare PPO

## 2018-09-19 ENCOUNTER — Inpatient Hospital Stay (HOSPITAL_BASED_OUTPATIENT_CLINIC_OR_DEPARTMENT_OTHER): Payer: Medicare PPO | Admitting: Oncology

## 2018-09-19 ENCOUNTER — Encounter: Payer: Self-pay | Admitting: Oncology

## 2018-09-19 ENCOUNTER — Other Ambulatory Visit: Payer: Self-pay

## 2018-09-19 ENCOUNTER — Inpatient Hospital Stay: Payer: Medicare PPO | Attending: Oncology

## 2018-09-19 VITALS — BP 152/83 | HR 68 | Temp 95.3°F | Wt 338.2 lb

## 2018-09-19 DIAGNOSIS — G4730 Sleep apnea, unspecified: Secondary | ICD-10-CM | POA: Insufficient documentation

## 2018-09-19 DIAGNOSIS — C50911 Malignant neoplasm of unspecified site of right female breast: Secondary | ICD-10-CM | POA: Insufficient documentation

## 2018-09-19 DIAGNOSIS — Z87891 Personal history of nicotine dependence: Secondary | ICD-10-CM | POA: Insufficient documentation

## 2018-09-19 DIAGNOSIS — Z79811 Long term (current) use of aromatase inhibitors: Secondary | ICD-10-CM | POA: Diagnosis not present

## 2018-09-19 DIAGNOSIS — K5900 Constipation, unspecified: Secondary | ICD-10-CM | POA: Insufficient documentation

## 2018-09-19 DIAGNOSIS — E039 Hypothyroidism, unspecified: Secondary | ICD-10-CM

## 2018-09-19 DIAGNOSIS — M129 Arthropathy, unspecified: Secondary | ICD-10-CM | POA: Insufficient documentation

## 2018-09-19 DIAGNOSIS — K219 Gastro-esophageal reflux disease without esophagitis: Secondary | ICD-10-CM | POA: Diagnosis not present

## 2018-09-19 DIAGNOSIS — J449 Chronic obstructive pulmonary disease, unspecified: Secondary | ICD-10-CM | POA: Diagnosis not present

## 2018-09-19 DIAGNOSIS — Z800 Family history of malignant neoplasm of digestive organs: Secondary | ICD-10-CM | POA: Insufficient documentation

## 2018-09-19 DIAGNOSIS — Z170 Estrogen receptor positive status [ER+]: Secondary | ICD-10-CM | POA: Insufficient documentation

## 2018-09-19 DIAGNOSIS — Z79899 Other long term (current) drug therapy: Secondary | ICD-10-CM

## 2018-09-19 DIAGNOSIS — R232 Flushing: Secondary | ICD-10-CM | POA: Insufficient documentation

## 2018-09-19 DIAGNOSIS — F419 Anxiety disorder, unspecified: Secondary | ICD-10-CM | POA: Diagnosis not present

## 2018-09-19 DIAGNOSIS — E119 Type 2 diabetes mellitus without complications: Secondary | ICD-10-CM | POA: Insufficient documentation

## 2018-09-19 DIAGNOSIS — Z801 Family history of malignant neoplasm of trachea, bronchus and lung: Secondary | ICD-10-CM

## 2018-09-19 DIAGNOSIS — D72829 Elevated white blood cell count, unspecified: Secondary | ICD-10-CM

## 2018-09-19 DIAGNOSIS — I509 Heart failure, unspecified: Secondary | ICD-10-CM | POA: Diagnosis not present

## 2018-09-19 DIAGNOSIS — I10 Essential (primary) hypertension: Secondary | ICD-10-CM | POA: Diagnosis not present

## 2018-09-19 DIAGNOSIS — Z803 Family history of malignant neoplasm of breast: Secondary | ICD-10-CM

## 2018-09-19 DIAGNOSIS — C50511 Malignant neoplasm of lower-outer quadrant of right female breast: Secondary | ICD-10-CM

## 2018-09-19 LAB — CBC WITH DIFFERENTIAL/PLATELET
Abs Immature Granulocytes: 0.07 10*3/uL (ref 0.00–0.07)
Basophils Absolute: 0 10*3/uL (ref 0.0–0.1)
Basophils Relative: 0 %
Eosinophils Absolute: 0.1 10*3/uL (ref 0.0–0.5)
Eosinophils Relative: 1 %
HCT: 39.8 % (ref 36.0–46.0)
Hemoglobin: 12.2 g/dL (ref 12.0–15.0)
Immature Granulocytes: 1 %
Lymphocytes Relative: 26 %
Lymphs Abs: 2.5 10*3/uL (ref 0.7–4.0)
MCH: 25.8 pg — ABNORMAL LOW (ref 26.0–34.0)
MCHC: 30.7 g/dL (ref 30.0–36.0)
MCV: 84.3 fL (ref 80.0–100.0)
Monocytes Absolute: 0.4 10*3/uL (ref 0.1–1.0)
Monocytes Relative: 4 %
Neutro Abs: 6.6 10*3/uL (ref 1.7–7.7)
Neutrophils Relative %: 68 %
Platelets: 245 10*3/uL (ref 150–400)
RBC: 4.72 MIL/uL (ref 3.87–5.11)
RDW: 14.1 % (ref 11.5–15.5)
WBC: 9.7 10*3/uL (ref 4.0–10.5)
nRBC: 0 % (ref 0.0–0.2)

## 2018-09-19 LAB — COMPREHENSIVE METABOLIC PANEL
ALT: 20 U/L (ref 0–44)
AST: 15 U/L (ref 15–41)
Albumin: 3.7 g/dL (ref 3.5–5.0)
Alkaline Phosphatase: 61 U/L (ref 38–126)
Anion gap: 10 (ref 5–15)
BUN: 14 mg/dL (ref 8–23)
CO2: 27 mmol/L (ref 22–32)
Calcium: 9.4 mg/dL (ref 8.9–10.3)
Chloride: 103 mmol/L (ref 98–111)
Creatinine, Ser: 1.07 mg/dL — ABNORMAL HIGH (ref 0.44–1.00)
GFR calc Af Amer: >60 mL/min (ref >60)
GFR calc non Af Amer: 54 mL/min — ABNORMAL LOW (ref >60)
Glucose, Bld: 122 mg/dL — ABNORMAL HIGH (ref 70–99)
Potassium: 3.6 mmol/L (ref 3.5–5.1)
Sodium: 140 mmol/L (ref 135–145)
Total Bilirubin: 0.9 mg/dL (ref 0.3–1.2)
Total Protein: 7.6 g/dL (ref 6.5–8.1)

## 2018-09-20 LAB — CANCER ANTIGEN 27.29: CA 27.29: 26.9 U/mL (ref 0.0–38.6)

## 2018-09-20 NOTE — Progress Notes (Signed)
[facility] day:  09/20/2018  Chief Complaint: Teresa Holland is a 68 y.o. female with stage I right breast cancer who is seen for 6 month assessment.  PERTINENT ONCOLOGY HISTORY Patient previously follows up with Dr.Corcoran and switch care to me on 03/15/2018.   # Patient has history of Stage I right breast cancer s/p  partial mastectomy on 09/20/2014.  Pathology revealed a 0.9 cm grade 1 invasive mammary carcinoma. There was no lymphovascular invasion.  Initial caudal margin was positive with invasive carcinoma. Re-resection was negative. One sentinel lymph node was negative.  Tumor was ER+ (> 90%), PR+ (>90%), and Her2/neu 1+.  Pathologic stage was T1bN0.  She did not receive radiation secondary to her morbid conditions. She could not lay flat for radiation.  She started Femara in 10/2014. She switched to Aromasin secondary to poor tolerance of Femara.  Bone density on 05/20/2011 was normal with a T-score of 0.4 in the left femur.   Bone density on 08/30/2016 was normal with a T-score of 0.1 in the left femur.  Bilateral diagnostic mammogram on 01/17/2017 revealed no evidence of malignancy in either breast. There were lumpectomy changes in the outer right breast. Bone scan on 01/01/2016 revealed multifocal degenerative changes without evidence of metastatic disease.  CA27.29 has been followed: 34.0 on 12/19/2015, 28.3 on 07/01/2016, 19.8 on 01/18/2017, and 20.6 on 08/02/2017,  03/15/2018 24.2.   INTERVAL HISTORY Teresa Holland is a 68 y.o. female who has above history reviewed by me today presents for follow up visit for management of history of Stage I right breast cancer. Problems and complaints are listed below: During the interval, her insurance no longer covers Aromasin and patient was switched to Arimidex '1mg'$  daily.  Patient reports increasing hot flash since the start of Arimidex, no exacerbating or alleviated factors.  Otherwise  doing well.  Denies any constitutional symptoms.  Weight has been stable. Appetite is fair.  . Review of Systems  Constitutional: Negative for appetite change, chills, fatigue and fever.  HENT:   Negative for hearing loss and voice change.   Eyes: Negative for eye problems.  Respiratory: Negative for chest tightness, cough and shortness of breath.   Cardiovascular: Negative for chest pain.  Gastrointestinal: Negative for abdominal distention, abdominal pain and blood in stool.  Endocrine: Negative for hot flashes.  Genitourinary: Negative for difficulty urinating and frequency.   Musculoskeletal: Negative for arthralgias.       Chronic back pain  Skin: Negative for itching and rash.  Neurological: Negative for extremity weakness.  Hematological: Negative for adenopathy.  Psychiatric/Behavioral: Negative for confusion.     Past Medical History:  Diagnosis Date  . Anxiety   . Arthritis   . Asthma   . Back pain   . Breast cancer ([place]) 2016   right breast  . Cancer ([place])    breast  . CHF (congestive heart failure) ([place])   . Claustrophobia    SEVERE  . Complication of anesthesia    heart issues 2004 during gb surgery had to be revived  . Constipation   . COPD (chronic obstructive pulmonary disease) ([place])   . Diabetes mellitus without complication ([place])   . Duodenitis   . Erosive gastritis   . GERD (gastroesophageal reflux disease)   . Gout   . Headache   . Hypertension   . Hypothyroidism   . Obesity   . Pain    chronic back  . Shortness of breath  dyspnea   . Sleep apnea    could not use cpap uses 1.5 l [place] at night    Past Surgical History:  Procedure Laterality Date  . abd pain    . ABDOMINAL HYSTERECTOMY    . ANKLE FRACTURE SURGERY    . BACK SURGERY     lumbar fusion  . BREAST BIOPSY Left 2004   negative  . BREAST BIOPSY Right 2016   positive- invasive mammary carcinoma  . BREAST LUMPECTOMY Right 2016  . CATARACT EXTRACTION W/PHACO Right 06/28/2017    Procedure: CATARACT EXTRACTION PHACO AND INTRAOCULAR LENS PLACEMENT (IOC);  Surgeon: Birder Robson, MD;  Location: [facility] ORS;  Service: Ophthalmology;  Laterality: Right;  [place] 02:20.6 AP% 20.6 CDE 28.90 Fluid Pack Lot # [id number]  . CHOLECYSTECTOMY    . COLONOSCOPY    . COLONOSCOPY WITH PROPOFOL N/A 09/07/2017   Procedure: COLONOSCOPY WITH PROPOFOL;  Surgeon: Toledo, Benay Pike, MD;  Location: [facility] ENDOSCOPY;  Service: Gastroenterology;  Laterality: N/A;  . ESOPHAGOGASTRODUODENOSCOPY (EGD) WITH PROPOFOL N/A 02/06/2016   Procedure: ESOPHAGOGASTRODUODENOSCOPY (EGD) WITH PROPOFOL;  Surgeon: Lollie Sails, MD;  Location: [facility] ENDOSCOPY;  Service: Endoscopy;  Laterality: N/A;  . FRACTURE SURGERY Right    ankle  . OOPHORECTOMY     one ovary remaining  . PARTIAL MASTECTOMY WITH NEEDLE LOCALIZATION Right 09/20/2014   Procedure: PARTIAL MASTECTOMY WITH NEEDLE LOCALIZATION;  Surgeon: Leonie Green, MD;  Location: [facility] ORS;  Service: General;  Laterality: Right;  . SENTINEL NODE BIOPSY Right 09/20/2014   Procedure: SENTINEL NODE BIOPSY;  Surgeon: Leonie Green, MD;  Location: [facility] ORS;  Service: General;  Laterality: Right;  . TONSILLECTOMY      Family History  Problem Relation Age of Onset  . Breast cancer Paternal Aunt        17's  . Breast cancer Maternal Aunt 68  . Breast cancer Maternal Aunt 67  . Colon cancer Mother   . Lung cancer Father   . Prostate cancer Neg Hx   . Kidney cancer Neg Hx   . Bladder Cancer Neg Hx    Social History   Socioeconomic History  . Marital status: Married    Spouse name: Not on file  . Number of children: Not on file  . Years of education: Not on file  . Highest education level: Not on file  Occupational History  . Not on file  Social Needs  . Financial resource strain: Not on file  . Food insecurity    Worry: Not on file    Inability: Not on file  . Transportation needs    Medical: Not on file    Non-medical: Not on file  Tobacco  Use  . Smoking status: Former Smoker    Types: E-cigarettes  . Smokeless tobacco: Never Used  . Tobacco comment: quit 06/14 uses e cigs occasionally  Substance and Sexual Activity  . Alcohol use: Not Currently  . Drug use: No  . Sexual activity: Not on file  Lifestyle  . Physical activity    Days per week: Not on file    Minutes per session: Not on file  . Stress: Not on file  Relationships  . Social Herbalist on phone: Not on file    Gets together: Not on file    Attends religious service: Not on file    Active member of club or organization: Not on file    Attends meetings of clubs or organizations: Not on file  Relationship status: Not on file  . Intimate partner violence    Fear of current or ex partner: Not on file    Emotionally abused: Not on file    Physically abused: Not on file    Forced sexual activity: Not on file  Other Topics Concern  . Not on file  Social History Narrative  . Not on file   Allergies:  Allergies  Allergen Reactions  . Aspirin Nausea Only and Other (See Comments)    Stomach burning  . Furosemide Palpitations  . Latex Itching, Rash and Other (See Comments)    SNEEZING  . Meloxicam Nausea Only  . Nsaids Nausea Only  . Tape Rash    Current Medications: Current Outpatient Medications  Medication Sig Dispense Refill  . albuterol (VENTOLIN HFA) 108 (90 Base) MCG/ACT inhaler Inhale 2 puffs into the lungs every 6 (six) hours as needed for wheezing or shortness of breath.     . anastrozole (ARIMIDEX) 1 MG tablet Take 1 tablet by mouth once daily 30 tablet 0  . calcium carbonate (CALCIUM 600) 600 MG TABS tablet Take 1 tablet (600 mg total) by mouth 2 (two) times daily with a meal. 60 tablet 6  . Cholecalciferol (VITAMIN D3) 2000 UNITS capsule Take 2,000 Units by mouth daily.     Marland Kitchen dicyclomine (BENTYL) 10 MG capsule Take 10 mg by mouth 4 (four) times daily as needed for spasms.    . Dulaglutide 0.75 MG/0.5ML SOPN Inject into the  skin.    Marland Kitchen enalapril (VASOTEC) 20 MG tablet Take 20 mg by mouth every morning.     . etodolac (LODINE) 400 MG tablet Take 400 mg by mouth 2 (two) times daily as needed.    . gabapentin (NEURONTIN) 300 MG capsule Take 300 mg by mouth 3 (three) times daily as needed (for back pain).     Marland Kitchen glimepiride (AMARYL) 4 MG tablet Take 4 mg by mouth daily with breakfast.     . guaiFENesin (MUCINEX) 600 MG 12 hr tablet Take 600 mg by mouth 2 (two) times daily as needed for cough or to loosen phlegm.     Marland Kitchen HYDROcodone-acetaminophen (NORCO/VICODIN) 5-325 MG per tablet Take 1 tablet by mouth every 6 (six) hours as needed for severe pain.     Marland Kitchen levothyroxine (SYNTHROID, LEVOTHROID) 75 MCG tablet Take 75 mcg by mouth daily before breakfast.     . linaclotide (LINZESS) 145 MCG CAPS capsule Take 145 mcg by mouth daily before breakfast.    . loratadine (CLARITIN) 10 MG tablet Take 10 mg by mouth at bedtime.     . magnesium oxide (MAG-OX) 400 MG tablet TAKE ONE TABLET BY MOUTH TWICE DAILY    . metoCLOPramide (REGLAN) 5 MG tablet Take 5 mg by mouth 4 (four) times daily.    . metoprolol (LOPRESSOR) 50 MG tablet Take 50 mg by mouth 2 (two) times daily.     Marland Kitchen OVER THE COUNTER MEDICATION Take 1 tablet by mouth at bedtime. Alteril Natural Sleep Aid    . pantoprazole (PROTONIX) 40 MG tablet Take 40 mg by mouth daily.    . polyethylene glycol (MIRALAX / GLYCOLAX) packet Take 17 g by mouth 4 (four) times daily as needed for moderate constipation.    . potassium chloride (KLOR-CON) 8 MEQ tablet Take 10 mEq by mouth daily.    . simvastatin (ZOCOR) 80 MG tablet Take 80 mg by mouth daily.      No current facility-administered medications for this visit.  Performance status (ECOG): 2 - Symptomatic, <50% confined to bed  Physical Exam: Blood pressure (!) 152/83, pulse 68, temperature (!) 95.3 F (35.2 C), temperature source Tympanic, weight (!) 338 lb 3.2 oz (153.4 kg). Physical Exam  Constitutional: She is oriented to  person, place, and time. No distress.  Morbidly obese  HENT:  Head: Normocephalic and atraumatic.  Mouth/Throat: No oropharyngeal exudate.  Eyes: Pupils are equal, round, and reactive to light. EOM are normal. No scleral icterus.  Neck: Normal range of motion. Neck supple.  Cardiovascular: Normal rate and regular rhythm.  No murmur heard. Pulmonary/Chest: Effort normal. No respiratory distress.  Abdominal: Soft. She exhibits no distension. There is no abdominal tenderness.  Musculoskeletal: Normal range of motion.        General: No edema.  Neurological: She is alert and oriented to person, place, and time. No cranial nerve deficit. She exhibits normal muscle tone. Coordination normal.  Skin: Skin is warm and dry. She is not diaphoretic. No erythema.  Psychiatric: Affect normal.  Breast exam was performed in seated and lying down position.  No palpable breast mass bilaterally. No evidence of axillary adenopathy bilaterally.     Appointment on 09/19/2018  Component Date Value Ref Range Status  . WBC 09/19/2018 9.7  4.0 - 10.5 K/uL Final  . RBC 09/19/2018 4.72  3.87 - 5.11 MIL/uL Final  . Hemoglobin 09/19/2018 12.2  12.0 - 15.0 g/dL Final  . HCT 09/19/2018 39.8  36.0 - 46.0 % Final  . MCV 09/19/2018 84.3  80.0 - 100.0 fL Final  . MCH 09/19/2018 25.8* 26.0 - 34.0 pg Final  . MCHC 09/19/2018 30.7  30.0 - 36.0 g/dL Final  . RDW 09/19/2018 14.1  11.5 - 15.5 % Final  . Platelets 09/19/2018 245  150 - 400 K/uL Final  . nRBC 09/19/2018 0.0  0.0 - 0.2 % Final  . Neutrophils Relative % 09/19/2018 68  % Final  . Neutro Abs 09/19/2018 6.6  1.7 - 7.7 K/uL Final  . Lymphocytes Relative 09/19/2018 26  % Final  . Lymphs Abs 09/19/2018 2.5  0.7 - 4.0 K/uL Final  . Monocytes Relative 09/19/2018 4  % Final  . Monocytes Absolute 09/19/2018 0.4  0.1 - 1.0 K/uL Final  . Eosinophils Relative 09/19/2018 1  % Final  . Eosinophils Absolute 09/19/2018 0.1  0.0 - 0.5 K/uL Final  . Basophils Relative  09/19/2018 0  % Final  . Basophils Absolute 09/19/2018 0.0  0.0 - 0.1 K/uL Final  . Immature Granulocytes 09/19/2018 1  % Final  . Abs Immature Granulocytes 09/19/2018 0.07  0.00 - 0.07 K/uL Final   Performed at [facility], [address] [place], [place] [address]  . Sodium 09/19/2018 140  135 - 145 mmol/L Final  . Potassium 09/19/2018 3.6  3.5 - 5.1 mmol/L Final  . Chloride 09/19/2018 103  98 - 111 mmol/L Final  . CO2 09/19/2018 27  22 - 32 mmol/L Final  . Glucose, Bld 09/19/2018 122* 70 - 99 mg/dL Final  . BUN 09/19/2018 14  8 - 23 mg/dL Final  . Creatinine, Ser 09/19/2018 1.07* 0.44 - 1.00 mg/dL Final  . Calcium 09/19/2018 9.4  8.9 - 10.3 mg/dL Final  . Total Protein 09/19/2018 7.6  6.5 - 8.1 g/dL Final  . Albumin 09/19/2018 3.7  3.5 - 5.0 g/dL Final  . AST 09/19/2018 15  15 - 41 U/L Final  . ALT 09/19/2018 20  0 - 44 U/L Final  . Alkaline  Phosphatase 09/19/2018 61  38 - 126 U/L Final  . Total Bilirubin 09/19/2018 0.9  0.3 - 1.2 mg/dL Final  . GFR calc non Af Amer 09/19/2018 54* >60 mL/min Final  . GFR calc Af Amer 09/19/2018 >60  >60 mL/min Final  . Anion gap 09/19/2018 10  5 - 15 Final   Performed at [facility], [address] [place], [place] [address]  . CA 27.29 09/19/2018 26.9  0.0 - 38.6 U/mL Final   Comment: (NOTE) Siemens Centaur Immunochemiluminometric Methodology [facility]) Values obtained with different assay methods or kits cannot be used interchangeably. Results cannot be interpreted as absolute evidence of the presence or absence of malignant disease. Performed At: [facility] [place], [place] [id number] Rush Farmer MD RC:[id number]     Assessment:  Teresa Holland is a 68 y.o. female with Stage I breast cancer presents to follow up  1. Malignant neoplasm of right breast in female, estrogen receptor positive, unspecified site of breast ([place])   2. Morbid obesity ([facility])   3. Leukocytosis, unspecified type   4. Hot  flashes   Labs are reviewed and discussed with patient. Most recently mammogram 01/18/2018, no evidence of malignance in bilateral breasts.  Annual mammogram due in November 2020, will obtain.  Recommend calcium and vitamin D supplementation.  Continue Arimidex She will be finishing 5 years of aromatase inhibitor treatments in #2021. We had a brief discussion about possible extending AI to 10 years.  Consider obtaining breast cancer index to protect her benefit for extended AI treatments.  Hot flash, vasovagal symptoms from Arimidex. Discussed about Effexor being an option if hot flash is not manageable.   Morbid obesity life style modification discussed.  Mild leukocytosis, resolved.   RTC in 6 months. Check CBC, CMP CA 27.29 Orders Placed This Encounter  Procedures  . MM DIAG BREAST TOMO BILATERAL    Standing Status:   Future    Standing Expiration Date:   09/19/2019    Order Specific Question:   Reason for Exam (SYMPTOM  OR DIAGNOSIS REQUIRED)    Answer:   right breast cancer    Order Specific Question:   Preferred imaging location?    Answer:   [facility]  . US Breast Limited Uni Left Inc Axilla    Standing Status:   Future    Standing Expiration Date:   11/20/2019    Order Specific Question:   Reason for Exam (SYMPTOM  OR DIAGNOSIS REQUIRED)    Answer:   right breast cancer    Order Specific Question:   Preferred imaging location?    Answer:   [facility]  . US Breast Limited Uni Right Inc Axilla    Standing Status:   Future    Standing Expiration Date:   11/20/2019    Order Specific Question:   Reason for Exam (SYMPTOM  OR DIAGNOSIS REQUIRED)    Answer:   right breast cancer    Order Specific Question:   Preferred imaging location?    Answer:   [facility]  . CBC with Differential/Platelet    Standing Status:   Future    Standing Expiration Date:   09/20/2019  . Comprehensive metabolic panel    Standing Status:   Future    Standing Expiration Date:    09/20/2019  . Cancer antigen 27.29    Standing Status:   Future    Standing Expiration Date:   09/20/2019    Earlie Server, MD, PhD Hematology Oncology  [place] at [facility] 09/20/2018

## 2018-10-04 ENCOUNTER — Other Ambulatory Visit: Payer: Self-pay | Admitting: Oncology

## 2019-01-05 ENCOUNTER — Other Ambulatory Visit: Payer: Self-pay

## 2019-01-05 DIAGNOSIS — Z20822 Contact with and (suspected) exposure to covid-19: Secondary | ICD-10-CM

## 2019-01-07 LAB — NOVEL CORONAVIRUS, NAA: SARS-CoV-2, NAA: NOT DETECTED

## 2019-01-22 ENCOUNTER — Ambulatory Visit
Admission: RE | Admit: 2019-01-22 | Discharge: 2019-01-22 | Disposition: A | Discharge status: Home / Self Care | Payer: Medicare PPO | Source: Ambulatory Visit | Attending: Oncology | Admitting: Oncology

## 2019-01-22 DIAGNOSIS — C50911 Malignant neoplasm of unspecified site of right female breast: Secondary | ICD-10-CM | POA: Diagnosis present

## 2019-01-22 DIAGNOSIS — Z170 Estrogen receptor positive status [ER+]: Secondary | ICD-10-CM | POA: Insufficient documentation

## 2019-01-29 ENCOUNTER — Inpatient Hospital Stay: Payer: Medicare PPO | Attending: Oncology | Admitting: Oncology

## 2019-01-29 ENCOUNTER — Other Ambulatory Visit: Payer: Self-pay

## 2019-01-29 ENCOUNTER — Encounter: Payer: Self-pay | Admitting: Oncology

## 2019-01-29 ENCOUNTER — Inpatient Hospital Stay: Payer: Medicare PPO | Admitting: *Deleted

## 2019-01-29 VITALS — BP 135/66 | HR 79 | Temp 96.0°F | Resp 18 | Wt 336.2 lb

## 2019-01-29 DIAGNOSIS — F419 Anxiety disorder, unspecified: Secondary | ICD-10-CM | POA: Insufficient documentation

## 2019-01-29 DIAGNOSIS — E119 Type 2 diabetes mellitus without complications: Secondary | ICD-10-CM | POA: Insufficient documentation

## 2019-01-29 DIAGNOSIS — Z90710 Acquired absence of both cervix and uterus: Secondary | ICD-10-CM | POA: Diagnosis not present

## 2019-01-29 DIAGNOSIS — Z803 Family history of malignant neoplasm of breast: Secondary | ICD-10-CM | POA: Insufficient documentation

## 2019-01-29 DIAGNOSIS — Z79811 Long term (current) use of aromatase inhibitors: Secondary | ICD-10-CM | POA: Insufficient documentation

## 2019-01-29 DIAGNOSIS — Z7984 Long term (current) use of oral hypoglycemic drugs: Secondary | ICD-10-CM | POA: Diagnosis not present

## 2019-01-29 DIAGNOSIS — Z801 Family history of malignant neoplasm of trachea, bronchus and lung: Secondary | ICD-10-CM | POA: Diagnosis not present

## 2019-01-29 DIAGNOSIS — Z79899 Other long term (current) drug therapy: Secondary | ICD-10-CM | POA: Diagnosis not present

## 2019-01-29 DIAGNOSIS — G4730 Sleep apnea, unspecified: Secondary | ICD-10-CM | POA: Insufficient documentation

## 2019-01-29 DIAGNOSIS — Z87891 Personal history of nicotine dependence: Secondary | ICD-10-CM | POA: Insufficient documentation

## 2019-01-29 DIAGNOSIS — Z170 Estrogen receptor positive status [ER+]: Secondary | ICD-10-CM | POA: Insufficient documentation

## 2019-01-29 DIAGNOSIS — C50911 Malignant neoplasm of unspecified site of right female breast: Secondary | ICD-10-CM | POA: Diagnosis present

## 2019-01-29 DIAGNOSIS — D72829 Elevated white blood cell count, unspecified: Secondary | ICD-10-CM

## 2019-01-29 DIAGNOSIS — E039 Hypothyroidism, unspecified: Secondary | ICD-10-CM | POA: Insufficient documentation

## 2019-01-29 DIAGNOSIS — N951 Menopausal and female climacteric states: Secondary | ICD-10-CM | POA: Insufficient documentation

## 2019-01-29 DIAGNOSIS — R232 Flushing: Secondary | ICD-10-CM

## 2019-01-29 DIAGNOSIS — Z800 Family history of malignant neoplasm of digestive organs: Secondary | ICD-10-CM | POA: Diagnosis not present

## 2019-01-29 DIAGNOSIS — I10 Essential (primary) hypertension: Secondary | ICD-10-CM | POA: Diagnosis present

## 2019-01-29 LAB — COMPREHENSIVE METABOLIC PANEL
ALT: 14 U/L (ref 0–44)
AST: 17 U/L (ref 15–41)
Albumin: 3.9 g/dL (ref 3.5–5.0)
Alkaline Phosphatase: 54 U/L (ref 38–126)
Anion gap: 10 (ref 5–15)
BUN: 20 mg/dL (ref 8–23)
CO2: 28 mmol/L (ref 22–32)
Calcium: 9.3 mg/dL (ref 8.9–10.3)
Chloride: 98 mmol/L (ref 98–111)
Creatinine, Ser: 1.31 mg/dL — ABNORMAL HIGH (ref 0.44–1.00)
GFR calc Af Amer: 49 mL/min — ABNORMAL LOW (ref >60)
GFR calc non Af Amer: 42 mL/min — ABNORMAL LOW (ref >60)
Glucose, Bld: 194 mg/dL — ABNORMAL HIGH (ref 70–99)
Potassium: 3.5 mmol/L (ref 3.5–5.1)
Sodium: 136 mmol/L (ref 135–145)
Total Bilirubin: 0.6 mg/dL (ref 0.3–1.2)
Total Protein: 8 g/dL (ref 6.5–8.1)

## 2019-01-29 LAB — CBC WITH DIFFERENTIAL/PLATELET
Abs Immature Granulocytes: 0.13 10*3/uL — ABNORMAL HIGH (ref 0.00–0.07)
Basophils Absolute: 0 10*3/uL (ref 0.0–0.1)
Basophils Relative: 0 %
Eosinophils Absolute: 0.1 10*3/uL (ref 0.0–0.5)
Eosinophils Relative: 1 %
HCT: 38.2 % (ref 36.0–46.0)
Hemoglobin: 11.3 g/dL — ABNORMAL LOW (ref 12.0–15.0)
Immature Granulocytes: 1 %
Lymphocytes Relative: 21 %
Lymphs Abs: 2.5 10*3/uL (ref 0.7–4.0)
MCH: 25 pg — ABNORMAL LOW (ref 26.0–34.0)
MCHC: 29.6 g/dL — ABNORMAL LOW (ref 30.0–36.0)
MCV: 84.5 fL (ref 80.0–100.0)
Monocytes Absolute: 0.5 10*3/uL (ref 0.1–1.0)
Monocytes Relative: 4 %
Neutro Abs: 8.3 10*3/uL — ABNORMAL HIGH (ref 1.7–7.7)
Neutrophils Relative %: 73 %
Platelets: 244 10*3/uL (ref 150–400)
RBC: 4.52 MIL/uL (ref 3.87–5.11)
RDW: 14.1 % (ref 11.5–15.5)
WBC: 11.5 10*3/uL — ABNORMAL HIGH (ref 4.0–10.5)
nRBC: 0 % (ref 0.0–0.2)

## 2019-01-29 MED ORDER — VENLAFAXINE HCL 37.5 MG PO TABS: 37.5000 mg | ORAL_TABLET | Freq: Every day | ORAL | 0 refills | Status: DC

## 2019-01-29 NOTE — Progress Notes (Signed)
[facility] day:  01/29/2019  Chief Complaint: Teresa Holland is a 68 y.o. female with stage I right breast cancer who is seen for 6 month assessment.  PERTINENT ONCOLOGY HISTORY Patient previously follows up with Dr.Corcoran and switch care to me on 03/15/2018.   # Patient has history of Stage I right breast cancer s/p  partial mastectomy on 09/20/2014.  Pathology revealed a 0.9 cm grade 1 invasive mammary carcinoma. There was no lymphovascular invasion.  Initial caudal margin was positive with invasive carcinoma. Re-resection was negative. One sentinel lymph node was negative.  Tumor was ER+ (> 90%), PR+ (>90%), and Her2/neu 1+.  Pathologic stage was T1bN0.  She did not receive radiation secondary to her morbid conditions. She could not lay flat for radiation.  She started Femara in 10/2014. She switched to Aromasin secondary to poor tolerance of Femara.  Bone density on 05/20/2011 was normal with a T-score of 0.4 in the left femur.   Bone density on 08/30/2016 was normal with a T-score of 0.1 in the left femur.  Bilateral diagnostic mammogram on 01/17/2017 revealed no evidence of malignancy in either breast. There were lumpectomy changes in the outer right breast. Bone scan on 01/01/2016 revealed multifocal degenerative changes without evidence of metastatic disease.  CA27.29 has been followed: 34.0 on 12/19/2015, 28.3 on 07/01/2016, 19.8 on 01/18/2017, and 20.6 on 08/02/2017,  03/15/2018 24.2.   INTERVAL HISTORY Teresa Holland is a 68 y.o. female who has above history reviewed by me today presents for follow up visit for management of history of Stage I right breast cancer. Problems and complaints are listed below: During the interval, her insurance no longer covers Aromasin and patient was switched to Arimidex '1mg'$  daily.  She continue to have hot flush, which interfere her daily life.  Also have chronic joint pain.  Worsening of ankle  edema, recently seen by primary care provider.  Started on torsemide 10 mg daily. Edema improves after leg elevation and worsened at the end of the day .  Denies any constitutional symptoms.  Denies any concerns of her breast today Patient had mammogram done during interval.   . Review of Systems  Constitutional: Negative for appetite change, chills, fatigue and fever.  HENT:   Negative for hearing loss and voice change.   Eyes: Negative for eye problems.  Respiratory: Negative for chest tightness, cough and shortness of breath.   Cardiovascular: Negative for chest pain.  Gastrointestinal: Negative for abdominal distention, abdominal pain and blood in stool.  Endocrine: Negative for hot flashes.  Genitourinary: Negative for difficulty urinating and frequency.   Musculoskeletal: Negative for arthralgias.       Chronic back pain  Skin: Negative for itching and rash.  Neurological: Negative for extremity weakness.  Hematological: Negative for adenopathy.  Psychiatric/Behavioral: Negative for confusion.     Past Medical History:  Diagnosis Date  . Anxiety   . Arthritis   . Asthma   . Back pain   . Breast cancer ([place]) 2016   right breast  . Cancer ([place])    breast  . CHF (congestive heart failure) ([place])   . Claustrophobia    SEVERE  . Complication of anesthesia    heart issues 2004 during gb surgery had to be revived  . Constipation   . COPD (chronic obstructive pulmonary disease) ([place])   . Diabetes mellitus without complication ([place])   . Duodenitis   . Erosive gastritis   . GERD (  gastroesophageal reflux disease)   . Gout   . Headache   . Hypertension   . Hypothyroidism   . Obesity   . Pain    chronic back  . Shortness of breath dyspnea   . Sleep apnea    could not use cpap uses 1.5 l [place] at night    Past Surgical History:  Procedure Laterality Date  . abd pain    . ABDOMINAL HYSTERECTOMY    . ANKLE FRACTURE SURGERY    . BACK SURGERY     lumbar fusion  .  BREAST BIOPSY Left 2004   negative  . BREAST BIOPSY Right 2016   positive- invasive mammary carcinoma  . BREAST LUMPECTOMY Right 2016  . CATARACT EXTRACTION W/PHACO Right 06/28/2017   Procedure: CATARACT EXTRACTION PHACO AND INTRAOCULAR LENS PLACEMENT (IOC);  Surgeon: Birder Robson, MD;  Location: [facility] ORS;  Service: Ophthalmology;  Laterality: Right;  [place] 02:20.6 AP% 20.6 CDE 28.90 Fluid Pack Lot # [id number]  . CHOLECYSTECTOMY    . COLONOSCOPY    . COLONOSCOPY WITH PROPOFOL N/A 09/07/2017   Procedure: COLONOSCOPY WITH PROPOFOL;  Surgeon: Toledo, Benay Pike, MD;  Location: [facility] ENDOSCOPY;  Service: Gastroenterology;  Laterality: N/A;  . ESOPHAGOGASTRODUODENOSCOPY (EGD) WITH PROPOFOL N/A 02/06/2016   Procedure: ESOPHAGOGASTRODUODENOSCOPY (EGD) WITH PROPOFOL;  Surgeon: Lollie Sails, MD;  Location: [facility] ENDOSCOPY;  Service: Endoscopy;  Laterality: N/A;  . FRACTURE SURGERY Right    ankle  . OOPHORECTOMY     one ovary remaining  . PARTIAL MASTECTOMY WITH NEEDLE LOCALIZATION Right 09/20/2014   Procedure: PARTIAL MASTECTOMY WITH NEEDLE LOCALIZATION;  Surgeon: Leonie Green, MD;  Location: [facility] ORS;  Service: General;  Laterality: Right;  . SENTINEL NODE BIOPSY Right 09/20/2014   Procedure: SENTINEL NODE BIOPSY;  Surgeon: Leonie Green, MD;  Location: [facility] ORS;  Service: General;  Laterality: Right;  . TONSILLECTOMY      Family History  Problem Relation Age of Onset  . Breast cancer Paternal Aunt        105's  . Breast cancer Maternal Aunt 68  . Breast cancer Maternal Aunt 67  . Colon cancer Mother   . Lung cancer Father   . Prostate cancer Neg Hx   . Kidney cancer Neg Hx   . Bladder Cancer Neg Hx    Social History   Socioeconomic History  . Marital status: Married    Spouse name: Not on file  . Number of children: Not on file  . Years of education: Not on file  . Highest education level: Not on file  Occupational History  . Not on file  Social Needs  . Financial  resource strain: Not on file  . Food insecurity    Worry: Not on file    Inability: Not on file  . Transportation needs    Medical: Not on file    Non-medical: Not on file  Tobacco Use  . Smoking status: Former Smoker    Types: E-cigarettes  . Smokeless tobacco: Never Used  . Tobacco comment: quit 06/14 uses e cigs occasionally  Substance and Sexual Activity  . Alcohol use: Not Currently  . Drug use: No  . Sexual activity: Not on file  Lifestyle  . Physical activity    Days per week: Not on file    Minutes per session: Not on file  . Stress: Not on file  Relationships  . Social Herbalist on phone: Not on file    Gets together:  Not on file    Attends religious service: Not on file    Active member of club or organization: Not on file    Attends meetings of clubs or organizations: Not on file    Relationship status: Not on file  . Intimate partner violence    Fear of current or ex partner: Not on file    Emotionally abused: Not on file    Physically abused: Not on file    Forced sexual activity: Not on file  Other Topics Concern  . Not on file  Social History Narrative  . Not on file   Allergies:  Allergies  Allergen Reactions  . Aspirin Nausea Only and Other (See Comments)    Stomach burning  . Furosemide Palpitations  . Latex Itching, Rash and Other (See Comments)    SNEEZING  . Meloxicam Nausea Only  . Nsaids Nausea Only  . Tape Rash    Current Medications: Current Outpatient Medications  Medication Sig Dispense Refill  . albuterol (VENTOLIN HFA) 108 (90 Base) MCG/ACT inhaler Inhale 2 puffs into the lungs every 6 (six) hours as needed for wheezing or shortness of breath.     . anastrozole (ARIMIDEX) 1 MG tablet Take 1 tablet by mouth once daily 30 tablet 5  . calcium carbonate (CALCIUM 600) 600 MG TABS tablet Take 1 tablet (600 mg total) by mouth 2 (two) times daily with a meal. 60 tablet 6  . Cholecalciferol (VITAMIN D3) 2000 UNITS capsule Take  2,000 Units by mouth daily.     Marland Kitchen dicyclomine (BENTYL) 10 MG capsule Take 10 mg by mouth 4 (four) times daily as needed for spasms.    . Dulaglutide 0.75 MG/0.5ML SOPN Inject into the skin.    Marland Kitchen enalapril (VASOTEC) 20 MG tablet Take 20 mg by mouth every morning.     . etodolac (LODINE) 400 MG tablet Take 400 mg by mouth 2 (two) times daily as needed.    . gabapentin (NEURONTIN) 300 MG capsule Take 300 mg by mouth 3 (three) times daily as needed (for back pain).     Marland Kitchen glimepiride (AMARYL) 4 MG tablet Take 4 mg by mouth daily with breakfast.     . guaiFENesin (MUCINEX) 600 MG 12 hr tablet Take 600 mg by mouth 2 (two) times daily as needed for cough or to loosen phlegm.     Marland Kitchen HYDROcodone-acetaminophen (NORCO/VICODIN) 5-325 MG per tablet Take 1 tablet by mouth every 6 (six) hours as needed for severe pain.     Marland Kitchen levothyroxine (SYNTHROID, LEVOTHROID) 75 MCG tablet Take 75 mcg by mouth daily before breakfast.     . linaclotide (LINZESS) 145 MCG CAPS capsule Take 145 mcg by mouth daily before breakfast.    . loratadine (CLARITIN) 10 MG tablet Take 10 mg by mouth at bedtime.     . magnesium oxide (MAG-OX) 400 MG tablet TAKE ONE TABLET BY MOUTH TWICE DAILY    . metoCLOPramide (REGLAN) 5 MG tablet Take 5 mg by mouth 4 (four) times daily.    . metoprolol (LOPRESSOR) 50 MG tablet Take 50 mg by mouth 2 (two) times daily.     Marland Kitchen OVER THE COUNTER MEDICATION Take 1 tablet by mouth at bedtime. Alteril Natural Sleep Aid    . pantoprazole (PROTONIX) 40 MG tablet Take 40 mg by mouth daily.    . potassium chloride (KLOR-CON) 8 MEQ tablet Take 10 mEq by mouth daily.    . simvastatin (ZOCOR) 80 MG tablet Take 80 mg by  mouth daily.     Marland Kitchen torsemide (DEMADEX) 10 MG tablet Take by mouth.    . polyethylene glycol (MIRALAX / GLYCOLAX) packet Take 17 g by mouth 4 (four) times daily as needed for moderate constipation.    Marland Kitchen venlafaxine (EFFEXOR) 37.5 MG tablet Take 1 tablet (37.5 mg total) by mouth daily. 30 tablet 0   No  current facility-administered medications for this visit.    Performance status (ECOG): 2 - Symptomatic, <50% confined to bed  Physical Exam: Blood pressure 135/66, pulse 79, temperature (!) 96 F (35.6 C), resp. rate 18, weight (!) 336 lb 3.2 oz (152.5 kg). Physical Exam  Constitutional: She is oriented to person, place, and time. No distress.  HENT:  Head: Normocephalic and atraumatic.  Mouth/Throat: No oropharyngeal exudate.  Eyes: Pupils are equal, round, and reactive to light. EOM are normal. No scleral icterus.  Neck: Normal range of motion. Neck supple.  Cardiovascular: Normal rate and regular rhythm.  No murmur heard. Pulmonary/Chest: Effort normal. No respiratory distress. She has no rales. She exhibits no tenderness.  Abdominal: Soft. She exhibits no distension. There is no abdominal tenderness.  Musculoskeletal: Normal range of motion.        General: No edema.     Comments: Ankle edema +1, bilaterally  Neurological: She is alert and oriented to person, place, and time. No cranial nerve deficit. She exhibits normal muscle tone. Coordination normal.  Skin: Skin is warm and dry. She is not diaphoretic. No erythema.  Psychiatric: Affect normal.  .     Clinical Support on 01/29/2019  Component Date Value Ref Range Status  . WBC 01/29/2019 11.5* 4.0 - 10.5 K/uL Final  . RBC 01/29/2019 4.52  3.87 - 5.11 MIL/uL Final  . Hemoglobin 01/29/2019 11.3* 12.0 - 15.0 g/dL Final  . HCT 01/29/2019 38.2  36.0 - 46.0 % Final  . MCV 01/29/2019 84.5  80.0 - 100.0 fL Final  . MCH 01/29/2019 25.0* 26.0 - 34.0 pg Final  . MCHC 01/29/2019 29.6* 30.0 - 36.0 g/dL Final  . RDW 01/29/2019 14.1  11.5 - 15.5 % Final  . Platelets 01/29/2019 244  150 - 400 K/uL Final  . nRBC 01/29/2019 0.0  0.0 - 0.2 % Final  . Neutrophils Relative % 01/29/2019 73  % Final  . Neutro Abs 01/29/2019 8.3* 1.7 - 7.7 K/uL Final  . Lymphocytes Relative 01/29/2019 21  % Final  . Lymphs Abs 01/29/2019 2.5  0.7 - 4.0  K/uL Final  . Monocytes Relative 01/29/2019 4  % Final  . Monocytes Absolute 01/29/2019 0.5  0.1 - 1.0 K/uL Final  . Eosinophils Relative 01/29/2019 1  % Final  . Eosinophils Absolute 01/29/2019 0.1  0.0 - 0.5 K/uL Final  . Basophils Relative 01/29/2019 0  % Final  . Basophils Absolute 01/29/2019 0.0  0.0 - 0.1 K/uL Final  . Immature Granulocytes 01/29/2019 1  % Final  . Abs Immature Granulocytes 01/29/2019 0.13* 0.00 - 0.07 K/uL Final   Performed at [facility], [address], [place], [place] [address]  . Sodium 01/29/2019 136  135 - 145 mmol/L Final  . Potassium 01/29/2019 3.5  3.5 - 5.1 mmol/L Final  . Chloride 01/29/2019 98  98 - 111 mmol/L Final  . CO2 01/29/2019 28  22 - 32 mmol/L Final  . Glucose, Bld 01/29/2019 194* 70 - 99 mg/dL Final  . BUN 01/29/2019 20  8 - 23 mg/dL Final  . Creatinine, Ser 01/29/2019 1.31* 0.44 - 1.00 mg/dL  Final  . Calcium 01/29/2019 9.3  8.9 - 10.3 mg/dL Final  . Total Protein 01/29/2019 8.0  6.5 - 8.1 g/dL Final  . Albumin 01/29/2019 3.9  3.5 - 5.0 g/dL Final  . AST 01/29/2019 17  15 - 41 U/L Final  . ALT 01/29/2019 14  0 - 44 U/L Final  . Alkaline Phosphatase 01/29/2019 54  38 - 126 U/L Final  . Total Bilirubin 01/29/2019 0.6  0.3 - 1.2 mg/dL Final  . GFR calc non Af Amer 01/29/2019 42* >60 mL/min Final  . GFR calc Af Amer 01/29/2019 49* >60 mL/min Final  . Anion gap 01/29/2019 10  5 - 15 Final   Performed at [facility], [address], [place], [place] [address]    Assessment:  WESLYN HOLSONBACK is a 68 y.o. female with Stage I breast cancer presents to follow up  1. Malignant neoplasm of right breast in female, estrogen receptor positive, unspecified site of breast ([place])   2. Hot flashes   3. Leukocytosis, unspecified type   Labs reviewed and discussed with patient.  #History of right breast cancer, stage I 01/22/2019 bilateral diagnostic mammogram was independent reviewed by me.  No evidence of new or recurrent breast  carcinoma.  Subtle benign postsurgical changes on the right. Recommend annual diagnostic mammogram in 1 year. Continue Arimidex 1 mg daily. Patient has had moderate difficulties tolerating Arimidex. Especially hot flashes. I discussed about adding Effexor 37.5 mg daily.  Rationale and side effects were discussed with patient. Prescription was sent to pharmacy. Patient will be finishing 5 years of aromatase inhibitor by mid 2021. I will check breast cancer index to predict her benefit for extended AI treatments.  Mild leukocytosis, chronic.  Continue to monitor.  RTC in 4 weeks. Virtual visit.    Earlie Server, MD, PhD Hematology Oncology [place] at [facility] 01/29/2019

## 2019-01-29 NOTE — Progress Notes (Signed)
Pt here for follow up. Pt continues to take arimidex but states she can no longer stand the hot flashes, which are getting worse. She also complains of bilat leg and ankle swelling for which she was prescribed torsemide. Medication was prescribed by Dr. Sabra Heck about 3 weeks ago. No new breast issues, only occasional pain to both breasts.

## 2019-01-30 ENCOUNTER — Telehealth: Payer: Self-pay

## 2019-01-30 LAB — CANCER ANTIGEN 27.29: CA 27.29: 26.6 U/mL (ref 0.0–38.6)

## 2019-01-30 NOTE — Telephone Encounter (Signed)
Test request for Breast cancer index sent on case: [id number]. Fax confirmation received from [facility].

## 2019-02-13 ENCOUNTER — Encounter: Payer: Self-pay | Admitting: Oncology

## 2019-02-15 ENCOUNTER — Encounter: Payer: Self-pay | Admitting: Oncology

## 2019-02-26 ENCOUNTER — Inpatient Hospital Stay: Payer: Medicare PPO | Attending: Oncology | Admitting: Oncology

## 2019-02-26 ENCOUNTER — Encounter: Payer: Self-pay | Admitting: Oncology

## 2019-02-27 ENCOUNTER — Other Ambulatory Visit: Payer: Self-pay | Admitting: *Deleted

## 2019-03-01 MED ORDER — VENLAFAXINE HCL 37.5 MG PO TABS: 37.5000 mg | ORAL_TABLET | Freq: Every day | ORAL | 0 refills | Status: DC

## 2019-03-09 ENCOUNTER — Encounter: Payer: Self-pay | Admitting: Oncology

## 2019-03-09 ENCOUNTER — Inpatient Hospital Stay: Payer: Medicare PPO | Attending: Oncology | Admitting: Oncology

## 2019-03-09 DIAGNOSIS — Z803 Family history of malignant neoplasm of breast: Secondary | ICD-10-CM | POA: Insufficient documentation

## 2019-03-09 DIAGNOSIS — Z79811 Long term (current) use of aromatase inhibitors: Secondary | ICD-10-CM | POA: Diagnosis not present

## 2019-03-09 DIAGNOSIS — I110 Hypertensive heart disease with heart failure: Secondary | ICD-10-CM | POA: Insufficient documentation

## 2019-03-09 DIAGNOSIS — Z7984 Long term (current) use of oral hypoglycemic drugs: Secondary | ICD-10-CM | POA: Insufficient documentation

## 2019-03-09 DIAGNOSIS — D72829 Elevated white blood cell count, unspecified: Secondary | ICD-10-CM

## 2019-03-09 DIAGNOSIS — R232 Flushing: Secondary | ICD-10-CM

## 2019-03-09 DIAGNOSIS — E119 Type 2 diabetes mellitus without complications: Secondary | ICD-10-CM | POA: Insufficient documentation

## 2019-03-09 DIAGNOSIS — E039 Hypothyroidism, unspecified: Secondary | ICD-10-CM | POA: Insufficient documentation

## 2019-03-09 DIAGNOSIS — Z79899 Other long term (current) drug therapy: Secondary | ICD-10-CM | POA: Insufficient documentation

## 2019-03-09 DIAGNOSIS — C50911 Malignant neoplasm of unspecified site of right female breast: Secondary | ICD-10-CM

## 2019-03-09 DIAGNOSIS — G4730 Sleep apnea, unspecified: Secondary | ICD-10-CM | POA: Insufficient documentation

## 2019-03-09 DIAGNOSIS — J449 Chronic obstructive pulmonary disease, unspecified: Secondary | ICD-10-CM | POA: Insufficient documentation

## 2019-03-09 DIAGNOSIS — F419 Anxiety disorder, unspecified: Secondary | ICD-10-CM | POA: Insufficient documentation

## 2019-03-09 DIAGNOSIS — N951 Menopausal and female climacteric states: Secondary | ICD-10-CM | POA: Insufficient documentation

## 2019-03-09 DIAGNOSIS — Z170 Estrogen receptor positive status [ER+]: Secondary | ICD-10-CM

## 2019-03-09 DIAGNOSIS — I509 Heart failure, unspecified: Secondary | ICD-10-CM | POA: Insufficient documentation

## 2019-03-09 DIAGNOSIS — Z87891 Personal history of nicotine dependence: Secondary | ICD-10-CM | POA: Insufficient documentation

## 2019-03-09 DIAGNOSIS — E669 Obesity, unspecified: Secondary | ICD-10-CM | POA: Insufficient documentation

## 2019-03-09 DIAGNOSIS — Z90710 Acquired absence of both cervix and uterus: Secondary | ICD-10-CM | POA: Insufficient documentation

## 2019-03-09 DIAGNOSIS — Z801 Family history of malignant neoplasm of trachea, bronchus and lung: Secondary | ICD-10-CM | POA: Insufficient documentation

## 2019-03-09 MED ORDER — VENLAFAXINE HCL 37.5 MG PO TABS: 37.5000 mg | ORAL_TABLET | Freq: Every day | ORAL | 2 refills | Status: DC

## 2019-03-09 MED ORDER — ANASTROZOLE 1 MG PO TABS: 1.0000 mg | ORAL_TABLET | Freq: Every day | ORAL | 2 refills | Status: DC

## 2019-03-09 NOTE — Progress Notes (Signed)
HEMATOLOGY-ONCOLOGY TeleHEALTH VISIT PROGRESS NOTE  I connected with Teresa Holland on [id number] at  8:45 AM EST by video enabled telemedicine visit and verified that I am speaking with the correct person using two identifiers. I discussed the limitations, risks, security and privacy concerns of performing an evaluation and management service by telemedicine and the availability of in-person appointments. I also discussed with the patient that there may be a patient responsible charge related to this service. The patient expressed understanding and agreed to proceed.   Other persons participating in the visit and their role in the encounter:  None  Patient's location: Home  Provider's location: office Chief Complaint: Follow-up for breast cancer   INTERVAL HISTORY Teresa Holland is a 69 y.o. female who has above history reviewed by me today presents for follow up visit for breast cancer Problems and complaints are listed below: She takes Arimidex. Patient reports that her hot flash has improved after taking Effexor.   Complains about hair thinning and decreased libido No other new complaints.  She denies any concerns of her breast.   Review of Systems  Constitutional: Negative for appetite change, chills, fatigue and fever.  HENT:   Negative for hearing loss and voice change.   Eyes: Negative for eye problems.  Respiratory: Negative for chest tightness and cough.   Cardiovascular: Negative for chest pain.  Gastrointestinal: Negative for abdominal distention, abdominal pain and blood in stool.  Endocrine: Negative for hot flashes.       Decreased libido  Genitourinary: Negative for difficulty urinating and frequency.   Musculoskeletal: Negative for arthralgias.  Skin: Negative for itching and rash.       Hair thinning  Neurological: Negative for extremity weakness.  Hematological: Negative for adenopathy.  Psychiatric/Behavioral: Negative for confusion.    Past Medical History:   Diagnosis Date  . Anxiety   . Arthritis   . Asthma   . Back pain   . Breast cancer ([place]) 2016   right breast  . Cancer ([place])    breast  . CHF (congestive heart failure) ([place])   . Claustrophobia    SEVERE  . Complication of anesthesia    heart issues 2004 during gb surgery had to be revived  . Constipation   . COPD (chronic obstructive pulmonary disease) ([place])   . Diabetes mellitus without complication ([place])   . Duodenitis   . Erosive gastritis   . GERD (gastroesophageal reflux disease)   . Gout   . Headache   . Hypertension   . Hypothyroidism   . Obesity   . Pain    chronic back  . Shortness of breath dyspnea   . Sleep apnea    could not use cpap uses 1.5 l [place] at night   Past Surgical History:  Procedure Laterality Date  . abd pain    . ABDOMINAL HYSTERECTOMY    . ANKLE FRACTURE SURGERY    . BACK SURGERY     lumbar fusion  . BREAST BIOPSY Left 2004   negative  . BREAST BIOPSY Right 2016   positive- invasive mammary carcinoma  . BREAST LUMPECTOMY Right 2016  . CATARACT EXTRACTION W/PHACO Right 06/28/2017   Procedure: CATARACT EXTRACTION PHACO AND INTRAOCULAR LENS PLACEMENT (IOC);  Surgeon: Birder Robson, MD;  Location: [facility] ORS;  Service: Ophthalmology;  Laterality: Right;  [place] 02:20.6 AP% 20.6 CDE 28.90 Fluid Pack Lot # [id number]  . CHOLECYSTECTOMY    . COLONOSCOPY    . COLONOSCOPY WITH PROPOFOL N/A 09/07/2017  Procedure: COLONOSCOPY WITH PROPOFOL;  Surgeon: Toledo, Benay Pike, MD;  Location: [facility] ENDOSCOPY;  Service: Gastroenterology;  Laterality: N/A;  . ESOPHAGOGASTRODUODENOSCOPY (EGD) WITH PROPOFOL N/A 02/06/2016   Procedure: ESOPHAGOGASTRODUODENOSCOPY (EGD) WITH PROPOFOL;  Surgeon: Lollie Sails, MD;  Location: [facility] ENDOSCOPY;  Service: Endoscopy;  Laterality: N/A;  . FRACTURE SURGERY Right    ankle  . OOPHORECTOMY     one ovary remaining  . PARTIAL MASTECTOMY WITH NEEDLE LOCALIZATION Right 09/20/2014   Procedure: PARTIAL MASTECTOMY WITH NEEDLE  LOCALIZATION;  Surgeon: Leonie Green, MD;  Location: [facility] ORS;  Service: General;  Laterality: Right;  . SENTINEL NODE BIOPSY Right 09/20/2014   Procedure: SENTINEL NODE BIOPSY;  Surgeon: Leonie Green, MD;  Location: [facility] ORS;  Service: General;  Laterality: Right;  . TONSILLECTOMY      Family History  Problem Relation Age of Onset  . Breast cancer Paternal Aunt        30's  . Breast cancer Maternal Aunt 68  . Breast cancer Maternal Aunt 67  . Colon cancer Mother   . Lung cancer Father   . Prostate cancer Neg Hx   . Kidney cancer Neg Hx   . Bladder Cancer Neg Hx     Social History   Socioeconomic History  . Marital status: Married    Spouse name: Not on file  . Number of children: Not on file  . Years of education: Not on file  . Highest education level: Not on file  Occupational History  . Not on file  Tobacco Use  . Smoking status: Former Smoker    Types: E-cigarettes  . Smokeless tobacco: Never Used  . Tobacco comment: quit 06/14 uses e cigs occasionally  Substance and Sexual Activity  . Alcohol use: Not Currently  . Drug use: No  . Sexual activity: Not on file  Other Topics Concern  . Not on file  Social History Narrative  . Not on file   Social Determinants of Health   Financial Resource Strain:   . Difficulty of Paying Living Expenses: Not on file  Food Insecurity:   . Worried About Charity fundraiser in the Last Year: Not on file  . Ran Out of Food in the Last Year: Not on file  Transportation Needs:   . Lack of Transportation (Medical): Not on file  . Lack of Transportation (Non-Medical): Not on file  Physical Activity:   . Days of Exercise per Week: Not on file  . Minutes of Exercise per Session: Not on file  Stress:   . Feeling of Stress : Not on file  Social Connections:   . Frequency of Communication with Friends and Family: Not on file  . Frequency of Social Gatherings with Friends and Family: Not on file  . Attends Religious  Services: Not on file  . Active Member of Clubs or Organizations: Not on file  . Attends Archivist Meetings: Not on file  . Marital Status: Not on file  Intimate Partner Violence:   . Fear of Current or Ex-Partner: Not on file  . Emotionally Abused: Not on file  . Physically Abused: Not on file  . Sexually Abused: Not on file    Current Outpatient Medications on File Prior to Visit  Medication Sig Dispense Refill  . albuterol (VENTOLIN HFA) 108 (90 Base) MCG/ACT inhaler Inhale 2 puffs into the lungs every 6 (six) hours as needed for wheezing or shortness of breath.     . calcium carbonate (  CALCIUM 600) 600 MG TABS tablet Take 1 tablet (600 mg total) by mouth 2 (two) times daily with a meal. 60 tablet 6  . Cholecalciferol (VITAMIN D3) 2000 UNITS capsule Take 2,000 Units by mouth daily.     Marland Kitchen dicyclomine (BENTYL) 10 MG capsule Take 10 mg by mouth 4 (four) times daily as needed for spasms.    . Dulaglutide 0.75 MG/0.5ML SOPN Inject into the skin.    Marland Kitchen enalapril (VASOTEC) 20 MG tablet Take 20 mg by mouth every morning.     . etodolac (LODINE) 400 MG tablet Take 400 mg by mouth 2 (two) times daily as needed.    . gabapentin (NEURONTIN) 300 MG capsule Take 300 mg by mouth 3 (three) times daily as needed (for back pain).     Marland Kitchen glimepiride (AMARYL) 4 MG tablet Take 4 mg by mouth daily with breakfast.     . guaiFENesin (MUCINEX) 600 MG 12 hr tablet Take 600 mg by mouth 2 (two) times daily as needed for cough or to loosen phlegm.     Marland Kitchen HYDROcodone-acetaminophen (NORCO/VICODIN) 5-325 MG per tablet Take 1 tablet by mouth every 6 (six) hours as needed for severe pain.     Marland Kitchen levothyroxine (SYNTHROID, LEVOTHROID) 75 MCG tablet Take 75 mcg by mouth daily before breakfast.     . linaclotide (LINZESS) 145 MCG CAPS capsule Take 145 mcg by mouth daily before breakfast.    . loratadine (CLARITIN) 10 MG tablet Take 10 mg by mouth at bedtime.     . magnesium oxide (MAG-OX) 400 MG tablet TAKE ONE TABLET  BY MOUTH TWICE DAILY    . metoCLOPramide (REGLAN) 5 MG tablet Take 5 mg by mouth 4 (four) times daily.    . metoprolol (LOPRESSOR) 50 MG tablet Take 50 mg by mouth 2 (two) times daily.     Marland Kitchen OVER THE COUNTER MEDICATION Take 1 tablet by mouth at bedtime. Alteril Natural Sleep Aid    . pantoprazole (PROTONIX) 40 MG tablet Take 40 mg by mouth daily.    . polyethylene glycol (MIRALAX / GLYCOLAX) packet Take 17 g by mouth 4 (four) times daily as needed for moderate constipation.    . potassium chloride (KLOR-CON) 10 MEQ tablet     . potassium chloride (KLOR-CON) 8 MEQ tablet Take 10 mEq by mouth daily.    . simvastatin (ZOCOR) 80 MG tablet Take 80 mg by mouth daily.     Marland Kitchen torsemide (DEMADEX) 10 MG tablet Take by mouth.    . triamterene-hydrochlorothiazide (DYAZIDE) 37.5-25 MG capsule      No current facility-administered medications on file prior to visit.    Allergies  Allergen Reactions  . Aspirin Nausea Only and Other (See Comments)    Stomach burning  . Furosemide Palpitations  . Latex Itching, Rash and Other (See Comments)    SNEEZING  . Meloxicam Nausea Only  . Nsaids Nausea Only  . Tape Rash       Observations/Objective: Today's Vitals   03/09/19 0853  PainSc: 0-No pain   There is no height or weight on file to calculate BMI.  Physical Exam  Constitutional: No distress.  Neurological: She is alert.  Psychiatric: Mood normal.    CBC    Component Value Date/Time   WBC 11.5 (H) 01/29/2019 1306   RBC 4.52 01/29/2019 1306   HGB 11.3 (L) 01/29/2019 1306   HGB 11.1 (L) 08/29/2012 0519   HCT 38.2 01/29/2019 1306   HCT 33.8 (L) 08/29/2012 [id number]  PLT 244 01/29/2019 1306   PLT 221 08/29/2012 0519   MCV 84.5 01/29/2019 1306   MCV 80 08/29/2012 0519   MCH 25.0 (L) 01/29/2019 1306   MCHC 29.6 (L) 01/29/2019 1306   RDW 14.1 01/29/2019 1306   RDW 14.4 08/29/2012 0519   LYMPHSABS 2.5 01/29/2019 1306   LYMPHSABS 3.7 (H) 08/29/2012 0519   MONOABS 0.5 01/29/2019 1306    MONOABS 1.0 (H) 08/29/2012 0519   EOSABS 0.1 01/29/2019 1306   EOSABS 0.1 08/29/2012 0519   BASOSABS 0.0 01/29/2019 1306   BASOSABS 0.1 08/29/2012 0519    CMP     Component Value Date/Time   NA 136 01/29/2019 1306   NA 142 08/29/2012 0519   K 3.5 01/29/2019 1306   K 3.3 (L) 08/29/2012 0519   CL 98 01/29/2019 1306   CL 104 08/29/2012 0519   CO2 28 01/29/2019 1306   CO2 32 08/29/2012 0519   GLUCOSE 194 (H) 01/29/2019 1306   GLUCOSE 156 (H) 08/29/2012 0519   BUN 20 01/29/2019 1306   BUN 13 08/29/2012 0519   CREATININE 1.31 (H) 01/29/2019 1306   CREATININE 1.23 08/29/2012 0519   CALCIUM 9.3 01/29/2019 1306   CALCIUM 8.4 (L) 08/29/2012 0519   PROT 8.0 01/29/2019 1306   PROT 6.4 08/29/2012 0519   ALBUMIN 3.9 01/29/2019 1306   ALBUMIN 3.1 (L) 08/29/2012 0519   AST 17 01/29/2019 1306   AST 26 08/29/2012 0519   ALT 14 01/29/2019 1306   ALT 21 08/29/2012 0519   ALKPHOS 54 01/29/2019 1306   ALKPHOS 62 08/29/2012 0519   BILITOT 0.6 01/29/2019 1306   BILITOT 0.7 08/29/2012 0519   GFRNONAA 42 (L) 01/29/2019 1306   GFRNONAA 48 (L) 08/29/2012 0519   GFRAA 49 (L) 01/29/2019 1306   GFRAA 56 (L) 08/29/2012 0519     Assessment and Plan: 1. Malignant neoplasm of right breast in female, estrogen receptor positive, unspecified site of breast ([place])   2. Hot flashes   3. Leukocytosis, unspecified type   4. Aromatase inhibitor use     #History of right breast cancer, stage I Continue annual bilateral diagnostic mammogram.  Most recent one was done November 2020.  Results has been reviewed with patient. Continue Arimidex 1 mg daily. During last visit, she reports having moderate difficulties tolerating Arimidex especially hot flash. Effexor 37.5 mg was started and she feels her hot flash symptom has improved. Continue Effexor.  Prescription was sent to pharmacy. We discussed about duration of aromatase inhibitor. She will be finishing 5 years of aromatase inhibitor by September  2021. Breast cancer index showed no benefit of extended endocrine.  risk of late distant recurrence 1.4% Results were discussed with patient. If she continues to have difficulties tolerating Arimidex, can consider stopping after 5 years of AI.  Chronic mild leukocytosis, counts are stable.  Continue to monitor.  #Chronic aromatase inhibitor use, recommend patient to take calcium and vitamin D supplementation. bone density was normal patient she is due for repeat bone density check.  Will obtain.     Follow Up Instructions: 4 months  Orders Placed This Encounter  Procedures  . DG Bone Density    Standing Status:   Future    Standing Expiration Date:   05/06/2020    Order Specific Question:   Reason for Exam (SYMPTOM  OR DIAGNOSIS REQUIRED)    Answer:   follow up, on AI    Order Specific Question:   Preferred imaging location?    Answer:  [facility]  . DG Bone Density    Standing Status:   Future    Standing Expiration Date:   03/08/2020    Order Specific Question:   Reason for Exam (SYMPTOM  OR DIAGNOSIS REQUIRED)    Answer:   aromatase inhibitor use, history of breast cancer    Order Specific Question:   Preferred imaging location?    Answer:   [facility]  . CBC with Differential    Standing Status:   Future    Standing Expiration Date:   03/08/2020  . Comprehensive metabolic panel    Standing Status:   Future    Standing Expiration Date:   03/08/2020    I discussed the assessment and treatment plan with the patient. The patient was provided an opportunity to ask questions and all were answered. The patient agreed with the plan and demonstrated an understanding of the instructions.  The patient was advised to call back or seek an in-person evaluation if the symptoms worsen or if the condition fails to improve as anticipated.    Earlie Server, MD 03/09/2019 10:08 PM

## 2019-03-09 NOTE — Progress Notes (Signed)
Patient contacted for Mychart visit. Pt reports that Venlafaxine has been taking her sex drive away and also hair is falling off. Pt is unsure if its the medication is causing this, but this is the only medication change she has had.

## 2019-03-29 ENCOUNTER — Encounter: Payer: Self-pay | Admitting: Oncology

## 2019-06-06 ENCOUNTER — Ambulatory Visit
Admission: RE | Admit: 2019-06-06 | Discharge: 2019-06-06 | Disposition: A | Discharge status: Home / Self Care | Payer: Medicare PPO | Source: Ambulatory Visit | Attending: Oncology | Admitting: Oncology

## 2019-06-06 DIAGNOSIS — Z170 Estrogen receptor positive status [ER+]: Secondary | ICD-10-CM | POA: Insufficient documentation

## 2019-06-06 DIAGNOSIS — C50911 Malignant neoplasm of unspecified site of right female breast: Secondary | ICD-10-CM | POA: Diagnosis not present

## 2019-06-06 DIAGNOSIS — Z79811 Long term (current) use of aromatase inhibitors: Secondary | ICD-10-CM | POA: Diagnosis not present

## 2019-07-05 ENCOUNTER — Other Ambulatory Visit: Payer: Self-pay

## 2019-07-05 ENCOUNTER — Inpatient Hospital Stay: Payer: Medicare PPO | Attending: Radiation Oncology

## 2019-07-05 DIAGNOSIS — Z90710 Acquired absence of both cervix and uterus: Secondary | ICD-10-CM | POA: Insufficient documentation

## 2019-07-05 DIAGNOSIS — Z79811 Long term (current) use of aromatase inhibitors: Secondary | ICD-10-CM | POA: Insufficient documentation

## 2019-07-05 DIAGNOSIS — Z90721 Acquired absence of ovaries, unilateral: Secondary | ICD-10-CM | POA: Insufficient documentation

## 2019-07-05 DIAGNOSIS — E669 Obesity, unspecified: Secondary | ICD-10-CM | POA: Insufficient documentation

## 2019-07-05 DIAGNOSIS — Z170 Estrogen receptor positive status [ER+]: Secondary | ICD-10-CM | POA: Insufficient documentation

## 2019-07-05 DIAGNOSIS — E039 Hypothyroidism, unspecified: Secondary | ICD-10-CM | POA: Insufficient documentation

## 2019-07-05 DIAGNOSIS — C50911 Malignant neoplasm of unspecified site of right female breast: Secondary | ICD-10-CM | POA: Insufficient documentation

## 2019-07-05 DIAGNOSIS — G4730 Sleep apnea, unspecified: Secondary | ICD-10-CM | POA: Insufficient documentation

## 2019-07-05 DIAGNOSIS — Z87891 Personal history of nicotine dependence: Secondary | ICD-10-CM | POA: Diagnosis not present

## 2019-07-05 DIAGNOSIS — Z7984 Long term (current) use of oral hypoglycemic drugs: Secondary | ICD-10-CM | POA: Insufficient documentation

## 2019-07-05 DIAGNOSIS — D72829 Elevated white blood cell count, unspecified: Secondary | ICD-10-CM | POA: Diagnosis not present

## 2019-07-05 DIAGNOSIS — Z79899 Other long term (current) drug therapy: Secondary | ICD-10-CM | POA: Insufficient documentation

## 2019-07-05 DIAGNOSIS — E119 Type 2 diabetes mellitus without complications: Secondary | ICD-10-CM | POA: Diagnosis not present

## 2019-07-05 DIAGNOSIS — J449 Chronic obstructive pulmonary disease, unspecified: Secondary | ICD-10-CM | POA: Diagnosis not present

## 2019-07-05 DIAGNOSIS — I110 Hypertensive heart disease with heart failure: Secondary | ICD-10-CM | POA: Insufficient documentation

## 2019-07-05 DIAGNOSIS — Z7951 Long term (current) use of inhaled steroids: Secondary | ICD-10-CM | POA: Insufficient documentation

## 2019-07-05 DIAGNOSIS — Z803 Family history of malignant neoplasm of breast: Secondary | ICD-10-CM | POA: Insufficient documentation

## 2019-07-05 DIAGNOSIS — Z801 Family history of malignant neoplasm of trachea, bronchus and lung: Secondary | ICD-10-CM | POA: Insufficient documentation

## 2019-07-05 DIAGNOSIS — F419 Anxiety disorder, unspecified: Secondary | ICD-10-CM | POA: Diagnosis not present

## 2019-07-05 DIAGNOSIS — Z800 Family history of malignant neoplasm of digestive organs: Secondary | ICD-10-CM | POA: Diagnosis not present

## 2019-07-05 DIAGNOSIS — I509 Heart failure, unspecified: Secondary | ICD-10-CM | POA: Diagnosis not present

## 2019-07-05 LAB — CBC WITH DIFFERENTIAL/PLATELET
Abs Immature Granulocytes: 0.08 10*3/uL — ABNORMAL HIGH (ref 0.00–0.07)
Basophils Absolute: 0 10*3/uL (ref 0.0–0.1)
Basophils Relative: 0 %
Eosinophils Absolute: 0.1 10*3/uL (ref 0.0–0.5)
Eosinophils Relative: 0 %
HCT: 40.6 % (ref 36.0–46.0)
Hemoglobin: 12.6 g/dL (ref 12.0–15.0)
Immature Granulocytes: 1 %
Lymphocytes Relative: 19 %
Lymphs Abs: 2.3 10*3/uL (ref 0.7–4.0)
MCH: 25.7 pg — ABNORMAL LOW (ref 26.0–34.0)
MCHC: 31 g/dL (ref 30.0–36.0)
MCV: 82.7 fL (ref 80.0–100.0)
Monocytes Absolute: 0.6 10*3/uL (ref 0.1–1.0)
Monocytes Relative: 5 %
Neutro Abs: 8.9 10*3/uL — ABNORMAL HIGH (ref 1.7–7.7)
Neutrophils Relative %: 75 %
Platelets: 291 10*3/uL (ref 150–400)
RBC: 4.91 MIL/uL (ref 3.87–5.11)
RDW: 14.9 % (ref 11.5–15.5)
WBC: 11.9 10*3/uL — ABNORMAL HIGH (ref 4.0–10.5)
nRBC: 0 % (ref 0.0–0.2)

## 2019-07-05 LAB — COMPREHENSIVE METABOLIC PANEL
ALT: 24 U/L (ref 0–44)
AST: 21 U/L (ref 15–41)
Albumin: 3.8 g/dL (ref 3.5–5.0)
Alkaline Phosphatase: 60 U/L (ref 38–126)
Anion gap: 10 (ref 5–15)
BUN: 18 mg/dL (ref 8–23)
CO2: 29 mmol/L (ref 22–32)
Calcium: 9.1 mg/dL (ref 8.9–10.3)
Chloride: 101 mmol/L (ref 98–111)
Creatinine, Ser: 1.17 mg/dL — ABNORMAL HIGH (ref 0.44–1.00)
GFR calc Af Amer: 56 mL/min — ABNORMAL LOW (ref >60)
GFR calc non Af Amer: 49 mL/min — ABNORMAL LOW (ref >60)
Glucose, Bld: 120 mg/dL — ABNORMAL HIGH (ref 70–99)
Potassium: 3.9 mmol/L (ref 3.5–5.1)
Sodium: 140 mmol/L (ref 135–145)
Total Bilirubin: 1 mg/dL (ref 0.3–1.2)
Total Protein: 7.6 g/dL (ref 6.5–8.1)

## 2019-07-06 ENCOUNTER — Inpatient Hospital Stay (HOSPITAL_BASED_OUTPATIENT_CLINIC_OR_DEPARTMENT_OTHER): Payer: Medicare PPO | Admitting: Oncology

## 2019-07-06 ENCOUNTER — Encounter: Payer: Self-pay | Admitting: Oncology

## 2019-07-06 DIAGNOSIS — C50911 Malignant neoplasm of unspecified site of right female breast: Secondary | ICD-10-CM | POA: Diagnosis not present

## 2019-07-06 DIAGNOSIS — D72829 Elevated white blood cell count, unspecified: Secondary | ICD-10-CM | POA: Diagnosis not present

## 2019-07-06 DIAGNOSIS — Z170 Estrogen receptor positive status [ER+]: Secondary | ICD-10-CM | POA: Diagnosis not present

## 2019-07-06 DIAGNOSIS — Z79811 Long term (current) use of aromatase inhibitors: Secondary | ICD-10-CM | POA: Diagnosis not present

## 2019-07-06 LAB — CANCER ANTIGEN 27.29: CA 27.29: 28.7 U/mL (ref 0.0–38.6)

## 2019-07-06 NOTE — Progress Notes (Signed)
Patient verified using two identifiers for virtual visit via telephone today.  Patient would like to discuss the elevated WBC on lab results.  The Venlafaxine is helping with hot flashes.

## 2019-07-06 NOTE — Progress Notes (Signed)
HEMATOLOGY-ONCOLOGY TeleHEALTH VISIT PROGRESS NOTE  I connected with Teresa Holland on [id number] at  2:00 PM EDT by video enabled telemedicine visit and verified that I am speaking with the correct person using two identifiers. I discussed the limitations, risks, security and privacy concerns of performing an evaluation and management service by telemedicine and the availability of in-person appointments. I also discussed with the patient that there may be a patient responsible charge related to this service. The patient expressed understanding and agreed to proceed.   Other persons participating in the visit and their role in the encounter:  None  Patient's location: Home  Provider's location: office Chief Complaint: breast cancer  INTERVAL HISTORY Teresa Holland is a 69 y.o. female who has above history reviewed by me today presents for follow up visit for breast cancer Problems and complaints are listed below:  She has no new concerns of her breasts. She takes Arimidex daily.  Hot flash has improved after taking Effexor.  Denies fever, chills, nausea, vomiting, diarrhea, chest pain, shortness of breath, abdominal pain, urinary symptoms, lower extremity swelling.     Review of Systems  Constitutional: Negative for appetite change, chills, fatigue and fever.  HENT:   Negative for hearing loss and voice change.   Eyes: Negative for eye problems.  Respiratory: Negative for chest tightness and cough.   Cardiovascular: Negative for chest pain.  Gastrointestinal: Negative for abdominal distention, abdominal pain and blood in stool.  Endocrine: Negative for hot flashes.  Genitourinary: Negative for difficulty urinating and frequency.   Musculoskeletal: Negative for arthralgias.  Skin: Negative for itching and rash.  Neurological: Negative for extremity weakness.  Hematological: Negative for adenopathy.  Psychiatric/Behavioral: Negative for confusion.    Past Medical History:   Diagnosis Date  . Anxiety   . Arthritis   . Asthma   . Back pain   . Breast cancer ([place]) 2016   right breast  . Cancer ([place])    breast  . CHF (congestive heart failure) ([place])   . Claustrophobia    SEVERE  . Complication of anesthesia    heart issues 2004 during gb surgery had to be revived  . Constipation   . COPD (chronic obstructive pulmonary disease) ([place])   . Diabetes mellitus without complication ([place])   . Duodenitis   . Erosive gastritis   . GERD (gastroesophageal reflux disease)   . Gout   . Headache   . Hypertension   . Hypothyroidism   . Obesity   . Pain    chronic back  . Shortness of breath dyspnea   . Sleep apnea    could not use cpap uses 1.5 l [place] at night   Past Surgical History:  Procedure Laterality Date  . abd pain    . ABDOMINAL HYSTERECTOMY    . ANKLE FRACTURE SURGERY    . BACK SURGERY     lumbar fusion  . BREAST BIOPSY Left 2004   negative  . BREAST BIOPSY Right 2016   positive- invasive mammary carcinoma  . BREAST LUMPECTOMY Right 2016  . CATARACT EXTRACTION W/PHACO Right 06/28/2017   Procedure: CATARACT EXTRACTION PHACO AND INTRAOCULAR LENS PLACEMENT (IOC);  Surgeon: Birder Robson, MD;  Location: [facility] ORS;  Service: Ophthalmology;  Laterality: Right;  [place] 02:20.6 AP% 20.6 CDE 28.90 Fluid Pack Lot # [id number]  . CHOLECYSTECTOMY    . COLONOSCOPY    . COLONOSCOPY WITH PROPOFOL N/A 09/07/2017   Procedure: COLONOSCOPY WITH PROPOFOL;  Surgeon: Alice Reichert, Benay Pike, MD;  Location: [facility] ENDOSCOPY;  Service: Gastroenterology;  Laterality: N/A;  . ESOPHAGOGASTRODUODENOSCOPY (EGD) WITH PROPOFOL N/A 02/06/2016   Procedure: ESOPHAGOGASTRODUODENOSCOPY (EGD) WITH PROPOFOL;  Surgeon: Lollie Sails, MD;  Location[facility];  Service: Endoscopy;  Laterality: N/A;  . FRACTURE SURGERY Right    ankle  . OOPHORECTOMY     one ovary remaining  . PARTIAL MASTECTOMY WITH NEEDLE LOCALIZATION Right 09/20/2014   Procedure: PARTIAL MASTECTOMY WITH NEEDLE  LOCALIZATION;  Surgeon: Leonie Green, MD;  Location: [facility] ORS;  Service: General;  Laterality: Right;  . SENTINEL NODE BIOPSY Right 09/20/2014   Procedure: SENTINEL NODE BIOPSY;  Surgeon: Leonie Green, MD;  Location: [facility] ORS;  Service: General;  Laterality: Right;  . TONSILLECTOMY      Family History  Problem Relation Age of Onset  . Breast cancer Paternal Aunt        48's  . Breast cancer Maternal Aunt 68  . Breast cancer Maternal Aunt 67  . Colon cancer Mother   . Lung cancer Father   . Prostate cancer Neg Hx   . Kidney cancer Neg Hx   . Bladder Cancer Neg Hx     Social History   Socioeconomic History  . Marital status: Married    Spouse name: Not on file  . Number of children: Not on file  . Years of education: Not on file  . Highest education level: Not on file  Occupational History  . Not on file  Tobacco Use  . Smoking status: Former Smoker    Types: E-cigarettes  . Smokeless tobacco: Never Used  . Tobacco comment: quit 06/14 uses e cigs occasionally  Substance and Sexual Activity  . Alcohol use: Not Currently  . Drug use: No  . Sexual activity: Not on file  Other Topics Concern  . Not on file  Social History Narrative  . Not on file   Social Determinants of Health   Financial Resource Strain:   . Difficulty of Paying Living Expenses:   Food Insecurity:   . Worried About Charity fundraiser in the Last Year:   . Arboriculturist in the Last Year:   Transportation Needs:   . Film/video editor (Medical):   Marland Kitchen Lack of Transportation (Non-Medical):   Physical Activity:   . Days of Exercise per Week:   . Minutes of Exercise per Session:   Stress:   . Feeling of Stress :   Social Connections:   . Frequency of Communication with Friends and Family:   . Frequency of Social Gatherings with Friends and Family:   . Attends Religious Services:   . Active Member of Clubs or Organizations:   . Attends Archivist Meetings:   Marland Kitchen Marital  Status:   Intimate Partner Violence:   . Fear of Current or Ex-Partner:   . Emotionally Abused:   Marland Kitchen Physically Abused:   . Sexually Abused:     Current Outpatient Medications on File Prior to Visit  Medication Sig Dispense Refill  . anastrozole (ARIMIDEX) 1 MG tablet Take 1 tablet (1 mg total) by mouth daily. 90 tablet 2  . calcium carbonate (CALCIUM 600) 600 MG TABS tablet Take 1 tablet (600 mg total) by mouth 2 (two) times daily with a meal. 60 tablet 6  . Cholecalciferol (VITAMIN D3) 2000 UNITS capsule Take 2,000 Units by mouth daily.     Marland Kitchen dicyclomine (BENTYL) 10 MG capsule Take 10 mg by mouth 4 (four) times daily as needed  for spasms.    . Dulaglutide 0.75 MG/0.5ML SOPN Inject into the skin.    Marland Kitchen enalapril (VASOTEC) 20 MG tablet Take 20 mg by mouth every morning.     . etodolac (LODINE) 400 MG tablet Take 400 mg by mouth 2 (two) times daily as needed.    . fluticasone (FLOVENT HFA) 110 MCG/ACT inhaler Inhale into the lungs 2 (two) times daily.    Marland Kitchen gabapentin (NEURONTIN) 300 MG capsule Take 300 mg by mouth 3 (three) times daily as needed (for back pain).     Marland Kitchen glimepiride (AMARYL) 4 MG tablet Take 4 mg by mouth daily with breakfast.     . guaiFENesin (MUCINEX) 600 MG 12 hr tablet Take 600 mg by mouth 2 (two) times daily as needed for cough or to loosen phlegm.     Marland Kitchen HYDROcodone-acetaminophen (NORCO/VICODIN) 5-325 MG per tablet Take 1 tablet by mouth every 6 (six) hours as needed for severe pain.     Marland Kitchen levothyroxine (SYNTHROID, LEVOTHROID) 75 MCG tablet Take 75 mcg by mouth daily before breakfast.     . linaclotide (LINZESS) 145 MCG CAPS capsule Take 145 mcg by mouth daily before breakfast.    . loratadine (CLARITIN) 10 MG tablet Take 10 mg by mouth at bedtime.     . magnesium oxide (MAG-OX) 400 MG tablet TAKE ONE TABLET BY MOUTH TWICE DAILY    . metoCLOPramide (REGLAN) 5 MG tablet Take 5 mg by mouth 3 (three) times daily before meals. 2-3 qd    . metoprolol (LOPRESSOR) 50 MG tablet  Take 50 mg by mouth 2 (two) times daily.     Marland Kitchen OVER THE COUNTER MEDICATION Take 1 tablet by mouth at bedtime. Alteril Natural Sleep Aid    . pantoprazole (PROTONIX) 40 MG tablet Take 40 mg by mouth daily as needed.     . potassium chloride (KLOR-CON) 10 MEQ tablet 10 mEq. PRN with Torsemide    . simvastatin (ZOCOR) 80 MG tablet Take 80 mg by mouth daily.     Marland Kitchen torsemide (DEMADEX) 10 MG tablet Take 10 mg by mouth. Prn swellling    . triamterene-hydrochlorothiazide (DYAZIDE) 37.5-25 MG capsule     . venlafaxine (EFFEXOR) 37.5 MG tablet Take 1 tablet (37.5 mg total) by mouth daily. 90 tablet 2  . albuterol (VENTOLIN HFA) 108 (90 Base) MCG/ACT inhaler Inhale 2 puffs into the lungs every 6 (six) hours as needed for wheezing or shortness of breath.     . polyethylene glycol (MIRALAX / GLYCOLAX) packet Take 17 g by mouth 4 (four) times daily as needed for moderate constipation.     No current facility-administered medications on file prior to visit.    Allergies  Allergen Reactions  . Aspirin Nausea Only and Other (See Comments)    Stomach burning  . Furosemide Palpitations  . Latex Itching, Rash and Other (See Comments)    SNEEZING  . Meloxicam Nausea Only  . Nsaids Nausea Only  . Tape Rash       Observations/Objective: Today's Vitals   07/06/19 1401  PainSc: 0-No pain   There is no height or weight on file to calculate BMI.  Physical Exam  Constitutional: No distress.  Neurological: She is alert.    CBC    Component Value Date/Time   WBC 11.9 (H) 07/05/2019 1504   RBC 4.91 07/05/2019 1504   HGB 12.6 07/05/2019 1504   HGB 11.1 (L) 08/29/2012 0519   HCT 40.6 07/05/2019 1504   HCT 33.8 (L)  08/29/2012 0519   PLT 291 07/05/2019 1504   PLT 221 08/29/2012 0519   MCV 82.7 07/05/2019 1504   MCV 80 08/29/2012 0519   MCH 25.7 (L) 07/05/2019 1504   MCHC 31.0 07/05/2019 1504   RDW 14.9 07/05/2019 1504   RDW 14.4 08/29/2012 0519   LYMPHSABS 2.3 07/05/2019 1504   LYMPHSABS 3.7 (H)  08/29/2012 0519   MONOABS 0.6 07/05/2019 1504   MONOABS 1.0 (H) 08/29/2012 0519   EOSABS 0.1 07/05/2019 1504   EOSABS 0.1 08/29/2012 0519   BASOSABS 0.0 07/05/2019 1504   BASOSABS 0.1 08/29/2012 0519    CMP     Component Value Date/Time   NA 140 07/05/2019 1504   NA 142 08/29/2012 0519   K 3.9 07/05/2019 1504   K 3.3 (L) 08/29/2012 0519   CL 101 07/05/2019 1504   CL 104 08/29/2012 0519   CO2 29 07/05/2019 1504   CO2 32 08/29/2012 0519   GLUCOSE 120 (H) 07/05/2019 1504   GLUCOSE 156 (H) 08/29/2012 0519   BUN 18 07/05/2019 1504   BUN 13 08/29/2012 0519   CREATININE 1.17 (H) 07/05/2019 1504   CREATININE 1.23 08/29/2012 0519   CALCIUM 9.1 07/05/2019 1504   CALCIUM 8.4 (L) 08/29/2012 0519   PROT 7.6 07/05/2019 1504   PROT 6.4 08/29/2012 0519   ALBUMIN 3.8 07/05/2019 1504   ALBUMIN 3.1 (L) 08/29/2012 0519   AST 21 07/05/2019 1504   AST 26 08/29/2012 0519   ALT 24 07/05/2019 1504   ALT 21 08/29/2012 0519   ALKPHOS 60 07/05/2019 1504   ALKPHOS 62 08/29/2012 0519   BILITOT 1.0 07/05/2019 1504   BILITOT 0.7 08/29/2012 0519   GFRNONAA 49 (L) 07/05/2019 1504   GFRNONAA 48 (L) 08/29/2012 0519   GFRAA 56 (L) 07/05/2019 1504   GFRAA 56 (L) 08/29/2012 0519     Assessment and Plan: 1. Malignant neoplasm of right breast in female, estrogen receptor positive, unspecified site of breast ([facility])   2. Leukocytosis, unspecified type   3. Aromatase inhibitor use     #History of right breast cancer stage I, Continue annual bilateral diagnostic mammogram.  Due in November 2021 Continue Arimidex 1 mg daily. Hot flash has improved after taking Effexor 37.5 mg daily.  Continue.  She will be finishing 5 years of aromatase inhibitor by September 2021.  Breast cancer index showed no benefit of extended endocrine therapy.  Risk of late distant recurrence 1.4%.  If she continues to have difficulties tolerating Arimidex, can consider stopping after she finishes 5 years of AI.  Patient has chronic  mild leukocytosis, predominantly neutrophilia. Likely reactive.  She does not have any constitutional symptoms. Check peripheral blood smear and cytometry at the next visit.  Chronic aromatase inhibitor use, recommend patient to continue taking calcium and vitamin D supplementation. 06/06/2019, bone density was normal.  Follow Up Instructions: 4-5 months.   I discussed the assessment and treatment plan with the patient. The patient was provided an opportunity to ask questions and all were answered. The patient agreed with the plan and demonstrated an understanding of the instructions.  The patient was advised to call back or seek an in-person evaluation if the symptoms worsen or if the condition fails to improve as anticipated.    Earlie Server, MD 07/06/2019 10:52 PM

## 2019-10-31 ENCOUNTER — Other Ambulatory Visit: Payer: Self-pay

## 2019-10-31 ENCOUNTER — Inpatient Hospital Stay: Payer: Medicare PPO | Attending: Radiation Oncology

## 2019-10-31 DIAGNOSIS — Z79811 Long term (current) use of aromatase inhibitors: Secondary | ICD-10-CM | POA: Insufficient documentation

## 2019-10-31 DIAGNOSIS — R232 Flushing: Secondary | ICD-10-CM | POA: Insufficient documentation

## 2019-10-31 DIAGNOSIS — E669 Obesity, unspecified: Secondary | ICD-10-CM | POA: Insufficient documentation

## 2019-10-31 DIAGNOSIS — G4730 Sleep apnea, unspecified: Secondary | ICD-10-CM | POA: Diagnosis not present

## 2019-10-31 DIAGNOSIS — Z87891 Personal history of nicotine dependence: Secondary | ICD-10-CM | POA: Diagnosis not present

## 2019-10-31 DIAGNOSIS — J45909 Unspecified asthma, uncomplicated: Secondary | ICD-10-CM | POA: Insufficient documentation

## 2019-10-31 DIAGNOSIS — Z9011 Acquired absence of right breast and nipple: Secondary | ICD-10-CM | POA: Insufficient documentation

## 2019-10-31 DIAGNOSIS — K219 Gastro-esophageal reflux disease without esophagitis: Secondary | ICD-10-CM | POA: Insufficient documentation

## 2019-10-31 DIAGNOSIS — C50911 Malignant neoplasm of unspecified site of right female breast: Secondary | ICD-10-CM

## 2019-10-31 DIAGNOSIS — J449 Chronic obstructive pulmonary disease, unspecified: Secondary | ICD-10-CM | POA: Insufficient documentation

## 2019-10-31 DIAGNOSIS — Z800 Family history of malignant neoplasm of digestive organs: Secondary | ICD-10-CM | POA: Insufficient documentation

## 2019-10-31 DIAGNOSIS — I110 Hypertensive heart disease with heart failure: Secondary | ICD-10-CM | POA: Diagnosis not present

## 2019-10-31 DIAGNOSIS — N951 Menopausal and female climacteric states: Secondary | ICD-10-CM | POA: Diagnosis not present

## 2019-10-31 DIAGNOSIS — Z170 Estrogen receptor positive status [ER+]: Secondary | ICD-10-CM | POA: Insufficient documentation

## 2019-10-31 DIAGNOSIS — I509 Heart failure, unspecified: Secondary | ICD-10-CM | POA: Insufficient documentation

## 2019-10-31 DIAGNOSIS — E119 Type 2 diabetes mellitus without complications: Secondary | ICD-10-CM | POA: Insufficient documentation

## 2019-10-31 DIAGNOSIS — Z801 Family history of malignant neoplasm of trachea, bronchus and lung: Secondary | ICD-10-CM | POA: Diagnosis not present

## 2019-10-31 DIAGNOSIS — D72829 Elevated white blood cell count, unspecified: Secondary | ICD-10-CM | POA: Insufficient documentation

## 2019-10-31 DIAGNOSIS — Z803 Family history of malignant neoplasm of breast: Secondary | ICD-10-CM | POA: Insufficient documentation

## 2019-10-31 DIAGNOSIS — E039 Hypothyroidism, unspecified: Secondary | ICD-10-CM | POA: Insufficient documentation

## 2019-10-31 DIAGNOSIS — F419 Anxiety disorder, unspecified: Secondary | ICD-10-CM | POA: Diagnosis not present

## 2019-10-31 DIAGNOSIS — Z90721 Acquired absence of ovaries, unilateral: Secondary | ICD-10-CM | POA: Insufficient documentation

## 2019-10-31 DIAGNOSIS — Z90710 Acquired absence of both cervix and uterus: Secondary | ICD-10-CM | POA: Insufficient documentation

## 2019-10-31 DIAGNOSIS — Z7951 Long term (current) use of inhaled steroids: Secondary | ICD-10-CM | POA: Insufficient documentation

## 2019-10-31 DIAGNOSIS — Z79899 Other long term (current) drug therapy: Secondary | ICD-10-CM | POA: Diagnosis not present

## 2019-10-31 DIAGNOSIS — Z7984 Long term (current) use of oral hypoglycemic drugs: Secondary | ICD-10-CM | POA: Insufficient documentation

## 2019-10-31 LAB — COMPREHENSIVE METABOLIC PANEL
ALT: 11 U/L (ref 0–44)
AST: 14 U/L — ABNORMAL LOW (ref 15–41)
Albumin: 4.2 g/dL (ref 3.5–5.0)
Alkaline Phosphatase: 54 U/L (ref 38–126)
Anion gap: 10 (ref 5–15)
BUN: 14 mg/dL (ref 8–23)
CO2: 28 mmol/L (ref 22–32)
Calcium: 9.2 mg/dL (ref 8.9–10.3)
Chloride: 101 mmol/L (ref 98–111)
Creatinine, Ser: 1.06 mg/dL — ABNORMAL HIGH (ref 0.44–1.00)
GFR calc Af Amer: >60 mL/min (ref >60)
GFR calc non Af Amer: 54 mL/min — ABNORMAL LOW (ref >60)
Glucose, Bld: 128 mg/dL — ABNORMAL HIGH (ref 70–99)
Potassium: 3.6 mmol/L (ref 3.5–5.1)
Sodium: 139 mmol/L (ref 135–145)
Total Bilirubin: 0.7 mg/dL (ref 0.3–1.2)
Total Protein: 8 g/dL (ref 6.5–8.1)

## 2019-10-31 LAB — CBC WITH DIFFERENTIAL/PLATELET
Abs Immature Granulocytes: 0.18 10*3/uL — ABNORMAL HIGH (ref 0.00–0.07)
Basophils Absolute: 0 10*3/uL (ref 0.0–0.1)
Basophils Relative: 0 %
Eosinophils Absolute: 0 10*3/uL (ref 0.0–0.5)
Eosinophils Relative: 0 %
HCT: 39.5 % (ref 36.0–46.0)
Hemoglobin: 12.1 g/dL (ref 12.0–15.0)
Immature Granulocytes: 1 %
Lymphocytes Relative: 18 %
Lymphs Abs: 2.3 10*3/uL (ref 0.7–4.0)
MCH: 25.6 pg — ABNORMAL LOW (ref 26.0–34.0)
MCHC: 30.6 g/dL (ref 30.0–36.0)
MCV: 83.7 fL (ref 80.0–100.0)
Monocytes Absolute: 0.6 10*3/uL (ref 0.1–1.0)
Monocytes Relative: 5 %
Neutro Abs: 9.6 10*3/uL — ABNORMAL HIGH (ref 1.7–7.7)
Neutrophils Relative %: 76 %
Platelets: 280 10*3/uL (ref 150–400)
RBC: 4.72 MIL/uL (ref 3.87–5.11)
RDW: 14.1 % (ref 11.5–15.5)
WBC: 12.8 10*3/uL — ABNORMAL HIGH (ref 4.0–10.5)
nRBC: 0 % (ref 0.0–0.2)

## 2019-10-31 LAB — LACTATE DEHYDROGENASE: LDH: 130 U/L (ref 98–192)

## 2019-11-01 LAB — CANCER ANTIGEN 27.29: CA 27.29: 24.7 U/mL (ref 0.0–38.6)

## 2019-11-06 LAB — COMP PANEL: LEUKEMIA/LYMPHOMA

## 2019-11-07 ENCOUNTER — Encounter: Payer: Self-pay | Admitting: Oncology

## 2019-11-07 ENCOUNTER — Other Ambulatory Visit: Payer: Self-pay

## 2019-11-07 ENCOUNTER — Inpatient Hospital Stay (HOSPITAL_BASED_OUTPATIENT_CLINIC_OR_DEPARTMENT_OTHER): Payer: Medicare PPO | Admitting: Oncology

## 2019-11-07 VITALS — BP 155/83 | HR 68 | Temp 95.8°F | Resp 16

## 2019-11-07 DIAGNOSIS — C50911 Malignant neoplasm of unspecified site of right female breast: Secondary | ICD-10-CM | POA: Diagnosis not present

## 2019-11-07 DIAGNOSIS — D72829 Elevated white blood cell count, unspecified: Secondary | ICD-10-CM

## 2019-11-07 DIAGNOSIS — Z170 Estrogen receptor positive status [ER+]: Secondary | ICD-10-CM

## 2019-11-07 DIAGNOSIS — Z79811 Long term (current) use of aromatase inhibitors: Secondary | ICD-10-CM

## 2019-11-07 DIAGNOSIS — R232 Flushing: Secondary | ICD-10-CM

## 2019-11-07 NOTE — Progress Notes (Signed)
Patient has new left elbow pain that was evaluated by PCP and diagnosed with arthritis pain.

## 2019-11-07 NOTE — Progress Notes (Signed)
[facility] day:  11/07/2019  Chief Complaint: Teresa [place] is a 69 y.o. female with stage I right breast cancer who is seen for 6 month assessment.  PERTINENT ONCOLOGY HISTORY Patient previously follows up with Dr.Corcoran and switch care to me on 03/15/2018.   # Patient has history of Stage I right breast cancer s/p  partial mastectomy on 09/20/2014.  Pathology revealed a 0.9 cm grade 1 invasive mammary carcinoma. There was no lymphovascular invasion.  Initial caudal margin was positive with invasive carcinoma. Re-resection was negative. One sentinel lymph node was negative.  Tumor was ER+ (> 90%), PR+ (>90%), and Her2/neu 1+.  Pathologic stage was T1bN0.  She did not receive radiation secondary to her morbid conditions. She could not lay flat for radiation.  She started Femara in 10/2014. She switched to Aromasin secondary to poor tolerance of Femara. 04/2018 switched to Arimidex due to high copay with Aromasin.   Bone density on 05/20/2011 was normal with a T-score of 0.4 in the left femur.   Bone density on 08/30/2016 was normal with a T-score of 0.1 in the left femur.  Bilateral diagnostic mammogram on 01/17/2017 revealed no evidence of malignancy in either breast. There were lumpectomy changes in the outer right breast. Bone scan on 01/01/2016 revealed multifocal degenerative changes without evidence of metastatic disease.  CA27.29 has been followed: 34.0 on 12/19/2015, 28.3 on 07/01/2016, 19.8 on 01/18/2017, and 20.6 on 08/02/2017,  03/15/2018 24.2.   INTERVAL HISTORY Teresa [place] is a 69 y.o. female who has above history reviewed by me today presents for follow up visit for management of history of Stage I right breast cancer. Problems and complaints are listed below: Patient has been on Arimidex 1 mg daily.  Patient is also on Effexor for hot flash.  Hot flash has been manageable and symptom has significantly improved. She  continues to have chronic back pain and joint pain. He has a new left elbow pain which was evaluated by primary care provider and was considered to be arthritis/nerve pain.   . Review of Systems  Constitutional: Negative for appetite change, chills, fatigue and fever.  HENT:   Negative for hearing loss and voice change.   Eyes: Negative for eye problems.  Respiratory: Negative for chest tightness, cough and shortness of breath.   Cardiovascular: Negative for chest pain.  Gastrointestinal: Negative for abdominal distention, abdominal pain and blood in stool.  Endocrine: Negative for hot flashes.  Genitourinary: Negative for difficulty urinating and frequency.   Musculoskeletal: Negative for arthralgias.       Chronic back pain  Skin: Negative for itching and rash.  Neurological: Negative for extremity weakness.  Hematological: Negative for adenopathy.  Psychiatric/Behavioral: Negative for confusion.     Past Medical History:  Diagnosis Date  . Anxiety   . Arthritis   . Asthma   . Back pain   . Breast cancer ([place]) 2016   right breast  . Cancer ([place])    breast  . CHF (congestive heart failure) ([place])   . Claustrophobia    SEVERE  . Complication of anesthesia    heart issues 2004 during gb surgery had to be revived  . Constipation   . COPD (chronic obstructive pulmonary disease) ([place])   . Diabetes mellitus without complication ([place])   . Duodenitis   . Erosive gastritis   . GERD (gastroesophageal reflux disease)   . Gout   . Headache   . Hypertension   .  Hypothyroidism   . Obesity   . Pain    chronic back  . Shortness of breath dyspnea   . Sleep apnea    could not use cpap uses 1.5 l [place] at night    Past Surgical History:  Procedure Laterality Date  . abd pain    . ABDOMINAL HYSTERECTOMY    . ANKLE FRACTURE SURGERY    . BACK SURGERY     lumbar fusion  . BREAST BIOPSY Left 2004   negative  . BREAST BIOPSY Right 2016   positive- invasive mammary carcinoma  .  BREAST LUMPECTOMY Right 2016  . CATARACT EXTRACTION W/PHACO Right 06/28/2017   Procedure: CATARACT EXTRACTION PHACO AND INTRAOCULAR LENS PLACEMENT (IOC);  Surgeon: Birder Robson, MD;  Location: [facility] ORS;  Service: Ophthalmology;  Laterality: Right;  [place] 02:20.6 AP% 20.6 CDE 28.90 Fluid Pack Lot # [id number]  . CHOLECYSTECTOMY    . COLONOSCOPY    . COLONOSCOPY WITH PROPOFOL N/A 09/07/2017   Procedure: COLONOSCOPY WITH PROPOFOL;  Surgeon: Toledo, Benay Pike, MD;  Location: [facility] ENDOSCOPY;  Service: Gastroenterology;  Laterality: N/A;  . ESOPHAGOGASTRODUODENOSCOPY (EGD) WITH PROPOFOL N/A 02/06/2016   Procedure: ESOPHAGOGASTRODUODENOSCOPY (EGD) WITH PROPOFOL;  Surgeon: Lollie Sails, MD;  Location: [facility] ENDOSCOPY;  Service: Endoscopy;  Laterality: N/A;  . FRACTURE SURGERY Right    ankle  . OOPHORECTOMY     one ovary remaining  . PARTIAL MASTECTOMY WITH NEEDLE LOCALIZATION Right 09/20/2014   Procedure: PARTIAL MASTECTOMY WITH NEEDLE LOCALIZATION;  Surgeon: Leonie Green, MD;  Location: [facility] ORS;  Service: General;  Laterality: Right;  . SENTINEL NODE BIOPSY Right 09/20/2014   Procedure: SENTINEL NODE BIOPSY;  Surgeon: Leonie Green, MD;  Location: [facility] ORS;  Service: General;  Laterality: Right;  . TONSILLECTOMY      Family History  Problem Relation Age of Onset  . Breast cancer Paternal Aunt        66's  . Breast cancer Maternal Aunt 68  . Breast cancer Maternal Aunt 67  . Colon cancer Mother   . Lung cancer Father   . Prostate cancer Neg Hx   . Kidney cancer Neg Hx   . Bladder Cancer Neg Hx    Social History   Socioeconomic History  . Marital status: Married    Spouse name: Not on file  . Number of children: Not on file  . Years of education: Not on file  . Highest education level: Not on file  Occupational History  . Not on file  Tobacco Use  . Smoking status: Former Smoker    Types: E-cigarettes  . Smokeless tobacco: Never Used  . Tobacco comment: quit 06/14  uses e cigs occasionally  Vaping Use  . Vaping Use: Some days  Substance and Sexual Activity  . Alcohol use: Not Currently  . Drug use: No  . Sexual activity: Not on file  Other Topics Concern  . Not on file  Social History Narrative  . Not on file   Social Determinants of Health   Financial Resource Strain:   . Difficulty of Paying Living Expenses: Not on file  Food Insecurity:   . Worried About Charity fundraiser in the Last Year: Not on file  . Ran Out of Food in the Last Year: Not on file  Transportation Needs:   . Lack of Transportation (Medical): Not on file  . Lack of Transportation (Non-Medical): Not on file  Physical Activity:   . Days of Exercise per Week: Not  on file  . Minutes of Exercise per Session: Not on file  Stress:   . Feeling of Stress : Not on file  Social Connections:   . Frequency of Communication with Friends and Family: Not on file  . Frequency of Social Gatherings with Friends and Family: Not on file  . Attends Religious Services: Not on file  . Active Member of Clubs or Organizations: Not on file  . Attends Archivist Meetings: Not on file  . Marital Status: Not on file  Intimate Partner Violence:   . Fear of Current or Ex-Partner: Not on file  . Emotionally Abused: Not on file  . Physically Abused: Not on file  . Sexually Abused: Not on file   Allergies:  Allergies  Allergen Reactions  . Aspirin Nausea Only and Other (See Comments)    Stomach burning  . Furosemide Palpitations  . Latex Itching, Rash and Other (See Comments)    SNEEZING  . Meloxicam Nausea Only  . Nsaids Nausea Only  . Tape Rash    Current Medications: Current Outpatient Medications  Medication Sig Dispense Refill  . albuterol (VENTOLIN HFA) 108 (90 Base) MCG/ACT inhaler Inhale 2 puffs into the lungs every 6 (six) hours as needed for wheezing or shortness of breath.     . anastrozole (ARIMIDEX) 1 MG tablet Take 1 tablet (1 mg total) by mouth daily. 90  tablet 2  . calcium carbonate (CALCIUM 600) 600 MG TABS tablet Take 1 tablet (600 mg total) by mouth 2 (two) times daily with a meal. 60 tablet 6  . Cholecalciferol (VITAMIN D3) 2000 UNITS capsule Take 2,000 Units by mouth daily.     Marland Kitchen dicyclomine (BENTYL) 10 MG capsule Take 10 mg by mouth 4 (four) times daily as needed for spasms.    . Dulaglutide 0.75 MG/0.5ML SOPN Inject into the skin.    Marland Kitchen enalapril (VASOTEC) 20 MG tablet Take 20 mg by mouth every morning.     . etodolac (LODINE) 400 MG tablet Take 400 mg by mouth 2 (two) times daily as needed.    . fluticasone (FLOVENT HFA) 110 MCG/ACT inhaler Inhale into the lungs 2 (two) times daily.    Marland Kitchen gabapentin (NEURONTIN) 300 MG capsule Take 300 mg by mouth 3 (three) times daily as needed (for back pain).     Marland Kitchen guaiFENesin (MUCINEX) 600 MG 12 hr tablet Take 600 mg by mouth 2 (two) times daily as needed for cough or to loosen phlegm.     Marland Kitchen HYDROcodone-acetaminophen (NORCO/VICODIN) 5-325 MG per tablet Take 1 tablet by mouth every 6 (six) hours as needed for severe pain.     Marland Kitchen levothyroxine (SYNTHROID, LEVOTHROID) 75 MCG tablet Take 75 mcg by mouth daily before breakfast.     . linaclotide (LINZESS) 145 MCG CAPS capsule Take 145 mcg by mouth daily before breakfast.    . loratadine (CLARITIN) 10 MG tablet Take 10 mg by mouth at bedtime.     . magnesium oxide (MAG-OX) 400 MG tablet TAKE ONE TABLET BY MOUTH TWICE DAILY    . metoCLOPramide (REGLAN) 5 MG tablet Take 5 mg by mouth 3 (three) times daily before meals. 2-3 qd    . metoprolol (LOPRESSOR) 50 MG tablet Take 50 mg by mouth 2 (two) times daily.     Marland Kitchen OVER THE COUNTER MEDICATION Take 1 tablet by mouth at bedtime. Alteril Natural Sleep Aid    . pantoprazole (PROTONIX) 40 MG tablet Take 40 mg by mouth daily as needed.     Marland Kitchen  polyethylene glycol (MIRALAX / GLYCOLAX) packet Take 17 g by mouth 4 (four) times daily as needed for moderate constipation.    . potassium chloride (KLOR-CON) 10 MEQ tablet 10 mEq.  PRN with Torsemide    . simvastatin (ZOCOR) 80 MG tablet Take 80 mg by mouth daily.     Marland Kitchen torsemide (DEMADEX) 10 MG tablet Take 10 mg by mouth. Prn swellling    . venlafaxine (EFFEXOR) 37.5 MG tablet Take 1 tablet (37.5 mg total) by mouth daily. 90 tablet 2  . glimepiride (AMARYL) 4 MG tablet Take 4 mg by mouth daily with breakfast.  (Patient not taking: Reported on 11/07/2019)    . triamterene-hydrochlorothiazide (DYAZIDE) 37.5-25 MG capsule  (Patient not taking: Reported on 11/07/2019)     No current facility-administered medications for this visit.   Performance status (ECOG): 2 - Symptomatic, <50% confined to bed  Physical Exam: Blood pressure (!) 155/83, pulse 68, temperature (!) 95.8 F (35.4 C), resp. rate 16. Physical Exam Constitutional:      General: She is not in acute distress.    Appearance: She is not diaphoretic.  HENT:     Head: Normocephalic and atraumatic.     Mouth/Throat:     Pharynx: No oropharyngeal exudate.  Eyes:     General: No scleral icterus.    Pupils: Pupils are equal, round, and reactive to light.  Cardiovascular:     Rate and Rhythm: Normal rate and regular rhythm.     Heart sounds: No murmur heard.   Pulmonary:     Effort: Pulmonary effort is normal. No respiratory distress.     Breath sounds: No rales.  Chest:     Chest wall: No tenderness.  Abdominal:     General: There is no distension.     Palpations: Abdomen is soft.     Tenderness: There is no abdominal tenderness.  Musculoskeletal:        General: Normal range of motion.     Cervical back: Normal range of motion and neck supple.     Comments: Ankle edema +1, bilaterally  Skin:    General: Skin is warm and dry.     Findings: No erythema.  Neurological:     Mental Status: She is alert and oriented to person, place, and time.     Cranial Nerves: No cranial nerve deficit.     Motor: No abnormal muscle tone.     Coordination: Coordination normal.  Psychiatric:        Mood and Affect:  Affect normal.   .     No visits with results within 3 Day(s) from this visit.  Latest known visit with results is:  Appointment on 10/31/2019  Component Date Value Ref Range Status  . PATH INTERP XXX-IMP 10/31/2019 Comment   Final   Comment: (NOTE) No significant diagnostic immunophenotypic abnormality detected. (See comment.)   . ANNOTATION COMMENT IMP 10/31/2019 Comment   Corrected   Recommend clinical correlation and follow up as appropriate.  Marland Kitchen CLINICAL INFO 10/31/2019 Comment   Corrected   Comment: (NOTE) Accompanying CBC dated 10/31/2019 shows: WBC count 12.8, Neu 9.6, Lym 2.3, Mon 0.6.   Marland Kitchen Specimen Type 10/31/2019 Comment   Final   Peripheral blood  . ASSESSMENT OF LEUKOCYTES 10/31/2019 Comment   Final   Comment: (NOTE) No monoclonal B cell population is detected. kappa:lambda ratio 1.9 There is no loss of, or aberrant expression of, the pan T cell antigens to suggest a neoplastic T cell process. CD4:CD8 ratio  2.7 No circulating blasts are detected. There is no immunophenotypic evidence of abnormal myeloid maturation. Rare neutrophils show slightly left-shifted maturation. Analysis of the leukocyte population shows: granulocytes 83%, monocytes 3%, lymphocytes 14%, blasts <0.1%, B cells 2%, T cells 11%, NK cells 1%.   . % Viable Cells 10/31/2019 Comment   Corrected   87%  . ANALYSIS AND GATING STRATEGY 10/31/2019 Comment   Final   8 color analysis with CD45/SSC gating  . IMMUNOPHENOTYPING STUDY 10/31/2019 Comment   Final   Comment: (NOTE) CD2       Normal         CD3       Normal CD4       Normal         CD5       Normal CD7       Normal         CD8       Normal CD10      Normal         CD11b     Normal CD13      Normal         CD14      Normal CD16      Normal         CD19      Normal CD20      Normal         CD33      Normal CD34      Normal         CD38      Normal CD45      Normal         CD56      Normal CD57      Normal         CD117      Normal HLA-DR    Normal         KAPPA     Normal LAMBDA    Normal         CD64      Normal   . PATHOLOGIST NAME 10/31/2019 Comment   Final   Cecilie Kicks, M. D.  . COMMENT: 10/31/2019 Comment   Corrected   Comment: (NOTE) Each antibody in this assay was utilized to assess for potential abnormalities of studied cell populations or to characterize identified abnormalities. This test was developed and its performance characteristics determined by [facility].  It has not been cleared or approved by the U.S. Food and Drug Administration. The FDA has determined that such clearance or approval is not necessary. This test is used for clinical purposes.  It should not be regarded as investigational or for research. Performed At: -[facility] [address] [place], [place] [id number] Katina Degree MDPhD [id number] Performed At: [facility] [address] [place], [place] [id number] Katina Degree MDPhD [id number]   . LDH 10/31/2019 130  98 - 192 U/L Final   Performed at [facility], [address] [place], [place] [address]  . CA 27.29 10/31/2019 24.7  0.0 - 38.6 U/mL Final   Comment: (NOTE) Siemens Centaur Immunochemiluminometric Methodology [facility]) Values obtained with different assay methods or kits cannot be used interchangeably. Results cannot be interpreted as absolute evidence of the presence or absence of malignant disease. Performed At: [facility] [place], [place] [id number] Rush Farmer MD [id number]   . Sodium 10/31/2019 139  135 - 145 mmol/L Final  . Potassium 10/31/2019 3.6  3.5 - 5.1 mmol/L Final  . Chloride 10/31/2019 101  98 - 111 mmol/L Final  . CO2 10/31/2019 28  22 - 32 mmol/L Final  . Glucose, Bld 10/31/2019 128* 70 - 99 mg/dL Final   Glucose reference range applies only to samples taken after fasting for at least 8 hours.  . BUN 10/31/2019 14  8 - 23 mg/dL Final  . Creatinine, Ser 10/31/2019 1.06* 0.44 - 1.00  mg/dL Final  . Calcium 10/31/2019 9.2  8.9 - 10.3 mg/dL Final  . Total Protein 10/31/2019 8.0  6.5 - 8.1 g/dL Final  . Albumin 10/31/2019 4.2  3.5 - 5.0 g/dL Final  . AST 10/31/2019 14* 15 - 41 U/L Final  . ALT 10/31/2019 11  0 - 44 U/L Final  . Alkaline Phosphatase 10/31/2019 54  38 - 126 U/L Final  . Total Bilirubin 10/31/2019 0.7  0.3 - 1.2 mg/dL Final  . GFR calc non Af Amer 10/31/2019 54* >60 mL/min Final  . GFR calc Af Amer 10/31/2019 >60  >60 mL/min Final  . Anion gap 10/31/2019 10  5 - 15 Final   Performed at [facility], [address] [place], [place] [address]  . WBC 10/31/2019 12.8* 4.0 - 10.5 K/uL Final  . RBC 10/31/2019 4.72  3.87 - 5.11 MIL/uL Final  . Hemoglobin 10/31/2019 12.1  12.0 - 15.0 g/dL Final  . HCT 10/31/2019 39.5  36 - 46 % Final  . MCV 10/31/2019 83.7  80.0 - 100.0 fL Final  . MCH 10/31/2019 25.6* 26.0 - 34.0 pg Final  . MCHC 10/31/2019 30.6  30.0 - 36.0 g/dL Final  . RDW 10/31/2019 14.1  11.5 - 15.5 % Final  . Platelets 10/31/2019 280  150 - 400 K/uL Final  . nRBC 10/31/2019 0.0  0.0 - 0.2 % Final  . Neutrophils Relative % 10/31/2019 76  % Final  . Neutro Abs 10/31/2019 9.6* 1.7 - 7.7 K/uL Final  . Lymphocytes Relative 10/31/2019 18  % Final  . Lymphs Abs 10/31/2019 2.3  0.7 - 4.0 K/uL Final  . Monocytes Relative 10/31/2019 5  % Final  . Monocytes Absolute 10/31/2019 0.6  0 - 1 K/uL Final  . Eosinophils Relative 10/31/2019 0  % Final  . Eosinophils Absolute 10/31/2019 0.0  0 - 0 K/uL Final  . Basophils Relative 10/31/2019 0  % Final  . Basophils Absolute 10/31/2019 0.0  0 - 0 K/uL Final  . Immature Granulocytes 10/31/2019 1  % Final  . Abs Immature Granulocytes 10/31/2019 0.18* 0.00 - 0.07 K/uL Final   Performed at [facility], [address] [place], [place] [address]    Assessment:  Teresa [place] is a 69 y.o. female with Stage I breast cancer presents to follow up  1. Malignant neoplasm of right breast in female, estrogen  receptor positive, unspecified site of breast ([place])   2. Leukocytosis, unspecified type   3. Aromatase inhibitor use   4. Hot flashes   Labs reviewed and discussed with patient.  #History of right breast cancer, stage I 01/22/2019 bilateral diagnostic mammogram was independent reviewed by me.  No evidence of new or recurrent breast carcinoma.  Subtle benign postsurgical changes on the right. Recommend patient to obtain annual bilateral diagnostic mammogram this November 2021. Continue Arimidex 1 mg daily. Patient has completed 5 years of aromatase inhibitor treatments. Previously breast cancer index was obtained as patient has a lot of difficulties tolerating aromatase inhibitor. Breast cancer index showed no benefit  of extended endocrine therapy, risk of late distant recurrence 1.4%.  I discussed this with patient that since there is no significant benefit of extending aromatase inhibitor beyond 5 years, given that she has had difficulties tolerating, I think is reasonable to stop treatments.  Patient is concerned about the small risk of future recurrence and prefers to continue aromatase inhibitor at this point as she feels the hot flash has improved and the joint pain is manageable.  We will continue Arimidex at this point. Patient has had bone density done which was normal.  Recommend patient to continue calcium and vitamin D supplementation.  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  No constitutional symptoms.  Flow cytometry was negative.  I will hold off additional work-up at this point.  RTC in 6 months   Earlie Server, MD, PhD Hematology Oncology [place] at [facility] 11/07/2019

## 2019-12-12 ENCOUNTER — Other Ambulatory Visit: Payer: Self-pay | Admitting: Oncology

## 2019-12-20 ENCOUNTER — Other Ambulatory Visit: Payer: Self-pay | Admitting: Oncology

## 2020-01-23 ENCOUNTER — Other Ambulatory Visit: Payer: Self-pay

## 2020-01-23 ENCOUNTER — Ambulatory Visit
Admission: RE | Admit: 2020-01-23 | Discharge: 2020-01-23 | Disposition: A | Discharge status: Home / Self Care | Payer: Medicare PPO | Source: Ambulatory Visit | Attending: Oncology | Admitting: Oncology

## 2020-01-23 DIAGNOSIS — C50911 Malignant neoplasm of unspecified site of right female breast: Secondary | ICD-10-CM

## 2020-01-23 DIAGNOSIS — Z853 Personal history of malignant neoplasm of breast: Secondary | ICD-10-CM | POA: Insufficient documentation

## 2020-01-23 DIAGNOSIS — Z170 Estrogen receptor positive status [ER+]: Secondary | ICD-10-CM

## 2020-02-27 ENCOUNTER — Encounter: Payer: Self-pay | Admitting: Oncology

## 2020-03-21 DIAGNOSIS — G4733 Obstructive sleep apnea (adult) (pediatric): Secondary | ICD-10-CM | POA: Diagnosis not present

## 2020-05-01 DIAGNOSIS — Z23 Encounter for immunization: Secondary | ICD-10-CM | POA: Diagnosis not present

## 2020-05-01 DIAGNOSIS — E782 Mixed hyperlipidemia: Secondary | ICD-10-CM | POA: Diagnosis not present

## 2020-05-01 DIAGNOSIS — M5412 Radiculopathy, cervical region: Secondary | ICD-10-CM | POA: Diagnosis not present

## 2020-05-01 DIAGNOSIS — E1169 Type 2 diabetes mellitus with other specified complication: Secondary | ICD-10-CM | POA: Diagnosis not present

## 2020-05-06 ENCOUNTER — Encounter: Payer: Self-pay | Admitting: Oncology

## 2020-05-06 ENCOUNTER — Inpatient Hospital Stay (HOSPITAL_BASED_OUTPATIENT_CLINIC_OR_DEPARTMENT_OTHER): Payer: Medicare HMO | Admitting: Oncology

## 2020-05-06 ENCOUNTER — Telehealth: Payer: Self-pay | Admitting: *Deleted

## 2020-05-06 ENCOUNTER — Inpatient Hospital Stay: Payer: Medicare HMO | Attending: Oncology

## 2020-05-06 VITALS — BP 155/93 | HR 76 | Temp 97.5°F | Resp 18 | Wt 350.9 lb

## 2020-05-06 DIAGNOSIS — I110 Hypertensive heart disease with heart failure: Secondary | ICD-10-CM | POA: Insufficient documentation

## 2020-05-06 DIAGNOSIS — I509 Heart failure, unspecified: Secondary | ICD-10-CM | POA: Diagnosis not present

## 2020-05-06 DIAGNOSIS — Z800 Family history of malignant neoplasm of digestive organs: Secondary | ICD-10-CM | POA: Insufficient documentation

## 2020-05-06 DIAGNOSIS — C50911 Malignant neoplasm of unspecified site of right female breast: Secondary | ICD-10-CM | POA: Insufficient documentation

## 2020-05-06 DIAGNOSIS — Z803 Family history of malignant neoplasm of breast: Secondary | ICD-10-CM | POA: Insufficient documentation

## 2020-05-06 DIAGNOSIS — D72829 Elevated white blood cell count, unspecified: Secondary | ICD-10-CM

## 2020-05-06 DIAGNOSIS — E039 Hypothyroidism, unspecified: Secondary | ICD-10-CM | POA: Insufficient documentation

## 2020-05-06 DIAGNOSIS — Z87891 Personal history of nicotine dependence: Secondary | ICD-10-CM | POA: Diagnosis not present

## 2020-05-06 DIAGNOSIS — Z7951 Long term (current) use of inhaled steroids: Secondary | ICD-10-CM | POA: Diagnosis not present

## 2020-05-06 DIAGNOSIS — Z79899 Other long term (current) drug therapy: Secondary | ICD-10-CM | POA: Insufficient documentation

## 2020-05-06 DIAGNOSIS — Z170 Estrogen receptor positive status [ER+]: Secondary | ICD-10-CM | POA: Diagnosis not present

## 2020-05-06 DIAGNOSIS — Z801 Family history of malignant neoplasm of trachea, bronchus and lung: Secondary | ICD-10-CM | POA: Insufficient documentation

## 2020-05-06 DIAGNOSIS — E119 Type 2 diabetes mellitus without complications: Secondary | ICD-10-CM | POA: Insufficient documentation

## 2020-05-06 DIAGNOSIS — Z79811 Long term (current) use of aromatase inhibitors: Secondary | ICD-10-CM | POA: Diagnosis not present

## 2020-05-06 DIAGNOSIS — Z90710 Acquired absence of both cervix and uterus: Secondary | ICD-10-CM | POA: Insufficient documentation

## 2020-05-06 LAB — CBC WITH DIFFERENTIAL/PLATELET
Abs Immature Granulocytes: 0.32 10*3/uL — ABNORMAL HIGH (ref 0.00–0.07)
Basophils Absolute: 0.1 10*3/uL (ref 0.0–0.1)
Basophils Relative: 1 %
Eosinophils Absolute: 0.1 10*3/uL (ref 0.0–0.5)
Eosinophils Relative: 0 %
HCT: 42.2 % (ref 36.0–46.0)
Hemoglobin: 13.1 g/dL (ref 12.0–15.0)
Immature Granulocytes: 2 %
Lymphocytes Relative: 29 %
Lymphs Abs: 5.1 10*3/uL — ABNORMAL HIGH (ref 0.7–4.0)
MCH: 25.7 pg — ABNORMAL LOW (ref 26.0–34.0)
MCHC: 31 g/dL (ref 30.0–36.0)
MCV: 82.9 fL (ref 80.0–100.0)
Monocytes Absolute: 1.3 10*3/uL — ABNORMAL HIGH (ref 0.1–1.0)
Monocytes Relative: 7 %
Neutro Abs: 10.8 10*3/uL — ABNORMAL HIGH (ref 1.7–7.7)
Neutrophils Relative %: 61 %
Platelets: 315 10*3/uL (ref 150–400)
RBC: 5.09 MIL/uL (ref 3.87–5.11)
RDW: 14.5 % (ref 11.5–15.5)
WBC: 17.6 10*3/uL — ABNORMAL HIGH (ref 4.0–10.5)
nRBC: 0 % (ref 0.0–0.2)

## 2020-05-06 LAB — COMPREHENSIVE METABOLIC PANEL
ALT: 14 U/L (ref 0–44)
AST: 16 U/L (ref 15–41)
Albumin: 4 g/dL (ref 3.5–5.0)
Alkaline Phosphatase: 51 U/L (ref 38–126)
Anion gap: 13 (ref 5–15)
BUN: 28 mg/dL — ABNORMAL HIGH (ref 8–23)
CO2: 30 mmol/L (ref 22–32)
Calcium: 9.4 mg/dL (ref 8.9–10.3)
Chloride: 96 mmol/L — ABNORMAL LOW (ref 98–111)
Creatinine, Ser: 1.25 mg/dL — ABNORMAL HIGH (ref 0.44–1.00)
GFR, Estimated: 47 mL/min — ABNORMAL LOW (ref >60)
Glucose, Bld: 103 mg/dL — ABNORMAL HIGH (ref 70–99)
Potassium: 3.6 mmol/L (ref 3.5–5.1)
Sodium: 139 mmol/L (ref 135–145)
Total Bilirubin: 0.8 mg/dL (ref 0.3–1.2)
Total Protein: 8.2 g/dL — ABNORMAL HIGH (ref 6.5–8.1)

## 2020-05-06 NOTE — Progress Notes (Signed)
Teresa Holland day:  05/06/2020  Chief Complaint: Teresa Holland is a 70 y.o. female with stage I right breast cancer who is seen for 6 month assessment.  PERTINENT ONCOLOGY HISTORY Patient previously follows up with Dr.Corcoran and switch care to me on 03/15/2018.   # Patient has history of Stage I right breast cancer s/p  partial mastectomy on 09/20/2014.  Pathology revealed a 0.9 cm grade 1 invasive mammary carcinoma. There was no lymphovascular invasion.  Initial caudal margin was positive with invasive carcinoma. Re-resection was negative. One sentinel lymph node was negative.  Tumor was ER+ (> 90%), PR+ (>90%), and Her2/neu 1+.  Pathologic stage was T1bN0.  She did not receive radiation secondary to her morbid conditions. She could not lay flat for radiation.  She started Femara in 10/2014. She switched to Aromasin secondary to poor tolerance of Femara. 04/2018 switched to Arimidex due to high copay with Aromasin.   Bone density on 05/20/2011 was normal with a T-score of 0.4 in the left femur.   Bone density on 08/30/2016 was normal with a T-score of 0.1 in the left femur.  Bilateral diagnostic mammogram on 01/17/2017 revealed no evidence of malignancy in either breast. There were lumpectomy changes in the outer right breast. Bone scan on 01/01/2016 revealed multifocal degenerative changes without evidence of metastatic disease.  CA27.29 has been followed: 34.0 on 12/19/2015, 28.3 on 07/01/2016, 19.8 on 01/18/2017, and 20.6 on 08/02/2017,  03/15/2018 24.2.   INTERVAL HISTORY Teresa Holland is a 70 y.o. female who has above history reviewed by me today presents for follow up visit for management of history of Stage I right breast cancer. Problems and complaints are listed below: Patient has been on Arimidex 1 mg daily.  She has been on Effexor which helped her hot flash. She reports feeling well. No new complaints. Chronic back pain and  arthralgia. .   . Review of Systems  Constitutional: Negative for appetite change, chills, fatigue and fever.  HENT:   Negative for hearing loss and voice change.   Eyes: Negative for eye problems.  Respiratory: Negative for chest tightness, cough and shortness of breath.   Cardiovascular: Negative for chest pain.  Gastrointestinal: Negative for abdominal distention, abdominal pain and blood in stool.  Endocrine: Negative for hot flashes.  Genitourinary: Negative for difficulty urinating and frequency.   Musculoskeletal: Negative for arthralgias.       Chronic back pain  Skin: Negative for itching and rash.  Neurological: Negative for extremity weakness.  Hematological: Negative for adenopathy.  Psychiatric/Behavioral: Negative for confusion.     Past Medical History:  Diagnosis Date  . Anxiety   . Arthritis   . Asthma   . Back pain   . Breast cancer ([place]) 2016   right breast  . Cancer ([place])    breast  . CHF (congestive heart failure) ([place])   . Claustrophobia    SEVERE  . Complication of anesthesia    heart issues 2004 during gb surgery had to be revived  . Constipation   . COPD (chronic obstructive pulmonary disease) ([place])   . Diabetes mellitus without complication ([place])   . Duodenitis   . Erosive gastritis   . GERD (gastroesophageal reflux disease)   . Gout   . Headache   . Hypertension   . Hypothyroidism   . Obesity   . Pain    chronic back  . Shortness of breath dyspnea   . Sleep apnea  could not use cpap uses 1.5 l [place] at night    Past Surgical History:  Procedure Laterality Date  . abd pain    . ABDOMINAL HYSTERECTOMY    . ANKLE FRACTURE SURGERY    . BACK SURGERY     lumbar fusion  . BREAST BIOPSY Left 2004   negative  . BREAST BIOPSY Right 2016   positive- invasive mammary carcinoma  . BREAST LUMPECTOMY Right 2016   [facility]  . CATARACT EXTRACTION W/PHACO Right 06/28/2017   Procedure: CATARACT EXTRACTION PHACO AND INTRAOCULAR LENS PLACEMENT  (IOC);  Surgeon: Birder Robson, MD;  Location: [facility] ORS;  Service: Ophthalmology;  Laterality: Right;  [place] 02:20.6 AP% 20.6 CDE 28.90 Fluid Pack Lot # [id number]  . CHOLECYSTECTOMY    . COLONOSCOPY    . COLONOSCOPY WITH PROPOFOL N/A 09/07/2017   Procedure: COLONOSCOPY WITH PROPOFOL;  Surgeon: Toledo, Benay Pike, MD;  Location: [facility] ENDOSCOPY;  Service: Gastroenterology;  Laterality: N/A;  . ESOPHAGOGASTRODUODENOSCOPY (EGD) WITH PROPOFOL N/A 02/06/2016   Procedure: ESOPHAGOGASTRODUODENOSCOPY (EGD) WITH PROPOFOL;  Surgeon: Lollie Sails, MD;  Location: [facility] ENDOSCOPY;  Service: Endoscopy;  Laterality: N/A;  . FRACTURE SURGERY Right    ankle  . OOPHORECTOMY     one ovary remaining  . PARTIAL MASTECTOMY WITH NEEDLE LOCALIZATION Right 09/20/2014   Procedure: PARTIAL MASTECTOMY WITH NEEDLE LOCALIZATION;  Surgeon: Leonie Green, MD;  Location: [facility] ORS;  Service: General;  Laterality: Right;  . SENTINEL NODE BIOPSY Right 09/20/2014   Procedure: SENTINEL NODE BIOPSY;  Surgeon: Leonie Green, MD;  Location: [facility] ORS;  Service: General;  Laterality: Right;  . TONSILLECTOMY      Family History  Problem Relation Age of Onset  . Breast cancer Paternal Aunt        97's  . Breast cancer Maternal Aunt 68  . Breast cancer Maternal Aunt 67  . Colon cancer Mother   . Lung cancer Father   . Prostate cancer Neg Hx   . Kidney cancer Neg Hx   . Bladder Cancer Neg Hx    Social History   Socioeconomic History  . Marital status: Married    Spouse name: Not on file  . Number of children: Not on file  . Years of education: Not on file  . Highest education level: Not on file  Occupational History  . Not on file  Tobacco Use  . Smoking status: Former Smoker    Types: E-cigarettes  . Smokeless tobacco: Never Used  . Tobacco comment: quit 06/14 uses e cigs occasionally  Vaping Use  . Vaping Use: Some days  Substance and Sexual Activity  . Alcohol use: Not Currently  . Drug use: No  .  Sexual activity: Not on file  Other Topics Concern  . Not on file  Social History Narrative  . Not on file   Social Determinants of Health   Financial Resource Strain: Not on file  Food Insecurity: Not on file  Transportation Needs: Not on file  Physical Activity: Not on file  Stress: Not on file  Social Connections: Not on file  Intimate Partner Violence: Not on file   Allergies:  Allergies  Allergen Reactions  . Aspirin Nausea Only and Other (See Comments)    Stomach burning  . Furosemide Palpitations  . Latex Itching, Rash and Other (See Comments)    SNEEZING  . Meloxicam Nausea Only  . Nsaids Nausea Only  . Tape Rash    Current Medications: Current Outpatient Medications  Medication  Sig Dispense Refill  . albuterol (VENTOLIN HFA) 108 (90 Base) MCG/ACT inhaler Inhale 2 puffs into the lungs every 6 (six) hours as needed for wheezing or shortness of breath.     . calcium carbonate (CALCIUM 600) 600 MG TABS tablet Take 1 tablet (600 mg total) by mouth 2 (two) times daily with a meal. 60 tablet 6  . Cholecalciferol (VITAMIN D3) 2000 UNITS capsule Take 2,000 Units by mouth daily.     Marland Kitchen dicyclomine (BENTYL) 10 MG capsule Take 10 mg by mouth 4 (four) times daily as needed for spasms.    . Dulaglutide 0.75 MG/0.5ML SOPN Inject into the skin.    Marland Kitchen enalapril (VASOTEC) 20 MG tablet Take 20 mg by mouth every morning.     . etodolac (LODINE) 400 MG tablet Take 400 mg by mouth 2 (two) times daily as needed.    . fluticasone (FLOVENT HFA) 110 MCG/ACT inhaler Inhale into the lungs 2 (two) times daily.    Marland Kitchen gabapentin (NEURONTIN) 300 MG capsule Take 300 mg by mouth 3 (three) times daily as needed (for back pain).     Marland Kitchen guaiFENesin (MUCINEX) 600 MG 12 hr tablet Take 600 mg by mouth 2 (two) times daily as needed for cough or to loosen phlegm.     Marland Kitchen HYDROcodone-acetaminophen (NORCO/VICODIN) 5-325 MG per tablet Take 1 tablet by mouth every 6 (six) hours as needed for severe pain.     Marland Kitchen  levothyroxine (SYNTHROID, LEVOTHROID) 75 MCG tablet Take 75 mcg by mouth daily before breakfast.     . linaclotide (LINZESS) 145 MCG CAPS capsule Take 145 mcg by mouth daily before breakfast.    . loratadine (CLARITIN) 10 MG tablet Take 10 mg by mouth at bedtime.     . magnesium oxide (MAG-OX) 400 MG tablet TAKE ONE TABLET BY MOUTH TWICE DAILY    . metoCLOPramide (REGLAN) 5 MG tablet Take 5 mg by mouth 3 (three) times daily before meals. 2-3 qd    . metoprolol (LOPRESSOR) 50 MG tablet Take 50 mg by mouth 2 (two) times daily.     Marland Kitchen omeprazole (PRILOSEC) 40 MG capsule Take by mouth.    Marland Kitchen OVER THE COUNTER MEDICATION Take 1 tablet by mouth at bedtime. Alteril Natural Sleep Aid    . polyethylene glycol (MIRALAX / GLYCOLAX) packet Take 17 g by mouth 4 (four) times daily as needed for moderate constipation.    . potassium chloride (KLOR-CON) 10 MEQ tablet 10 mEq. PRN with Torsemide    . simvastatin (ZOCOR) 80 MG tablet Take 80 mg by mouth daily.     Marland Kitchen venlafaxine (EFFEXOR) 37.5 MG tablet Take 1 tablet by mouth once daily 90 tablet 1  . triamterene-hydrochlorothiazide (DYAZIDE) 37.5-25 MG capsule  (Patient not taking: No sig reported)     No current facility-administered medications for this visit.   Performance status (ECOG): 2 - Symptomatic, <50% confined to bed  Physical Exam: Blood pressure (!) 155/93, pulse 76, temperature (!) 97.5 F (36.4 C), resp. rate 18, weight (!) 350 lb 14.4 oz (159.2 kg). Physical Exam Constitutional:      General: She is not in acute distress.    Appearance: She is not diaphoretic.     Comments: Patient sits in the wheelchair  HENT:     Head: Normocephalic and atraumatic.     Mouth/Throat:     Pharynx: No oropharyngeal exudate.  Eyes:     General: No scleral icterus.    Pupils: Pupils are equal, round, and  reactive to light.  Cardiovascular:     Rate and Rhythm: Normal rate and regular rhythm.     Heart sounds: No murmur heard.   Pulmonary:     Effort:  Pulmonary effort is normal. No respiratory distress.     Breath sounds: No rales.  Chest:     Chest wall: No tenderness.  Abdominal:     General: There is no distension.     Palpations: Abdomen is soft.     Tenderness: There is no abdominal tenderness.  Musculoskeletal:        General: Normal range of motion.     Cervical back: Normal range of motion and neck supple.     Comments: Ankle edema +1, bilaterally  Skin:    General: Skin is warm and dry.     Findings: No erythema.  Neurological:     Mental Status: She is alert and oriented to person, place, and time.     Cranial Nerves: No cranial nerve deficit.     Motor: No abnormal muscle tone.     Coordination: Coordination normal.  Psychiatric:        Mood and Affect: Affect normal.   .     Appointment on 05/06/2020  Component Date Value Ref Range Status  . WBC 05/06/2020 17.6* 4.0 - 10.5 K/uL Final  . RBC 05/06/2020 5.09  3.87 - 5.11 MIL/uL Final  . Hemoglobin 05/06/2020 13.1  12.0 - 15.0 g/dL Final  . HCT 05/06/2020 42.2  36.0 - 46.0 % Final  . MCV 05/06/2020 82.9  80.0 - 100.0 fL Final  . MCH 05/06/2020 25.7* 26.0 - 34.0 pg Final  . MCHC 05/06/2020 31.0  30.0 - 36.0 g/dL Final  . RDW 05/06/2020 14.5  11.5 - 15.5 % Final  . Platelets 05/06/2020 315  150 - 400 K/uL Final  . nRBC 05/06/2020 0.0  0.0 - 0.2 % Final  . Neutrophils Relative % 05/06/2020 61  % Final  . Neutro Abs 05/06/2020 10.8* 1.7 - 7.7 K/uL Final  . Lymphocytes Relative 05/06/2020 29  % Final  . Lymphs Abs 05/06/2020 5.1* 0.7 - 4.0 K/uL Final  . Monocytes Relative 05/06/2020 7  % Final  . Monocytes Absolute 05/06/2020 1.3* 0.1 - 1.0 K/uL Final  . Eosinophils Relative 05/06/2020 0  % Final  . Eosinophils Absolute 05/06/2020 0.1  0.0 - 0.5 K/uL Final  . Basophils Relative 05/06/2020 1  % Final  . Basophils Absolute 05/06/2020 0.1  0.0 - 0.1 K/uL Final  . Immature Granulocytes 05/06/2020 2  % Final  . Abs Immature Granulocytes 05/06/2020 0.32* 0.00 - 0.07  K/uL Final   Performed at [facility], [address], [place], [place] [address]  . Sodium 05/06/2020 139  135 - 145 mmol/L Final  . Potassium 05/06/2020 3.6  3.5 - 5.1 mmol/L Final  . Chloride 05/06/2020 96* 98 - 111 mmol/L Final  . CO2 05/06/2020 30  22 - 32 mmol/L Final  . Glucose, Bld 05/06/2020 103* 70 - 99 mg/dL Final   Glucose reference range applies only to samples taken after fasting for at least 8 hours.  . BUN 05/06/2020 28* 8 - 23 mg/dL Final  . Creatinine, Ser 05/06/2020 1.25* 0.44 - 1.00 mg/dL Final  . Calcium 05/06/2020 9.4  8.9 - 10.3 mg/dL Final  . Total Protein 05/06/2020 8.2* 6.5 - 8.1 g/dL Final  . Albumin 05/06/2020 4.0  3.5 - 5.0 g/dL Final  . AST 05/06/2020 16  15 - 41 U/L  Final  . ALT 05/06/2020 14  0 - 44 U/L Final  . Alkaline Phosphatase 05/06/2020 51  38 - 126 U/L Final  . Total Bilirubin 05/06/2020 0.8  0.3 - 1.2 mg/dL Final  . GFR, Estimated 05/06/2020 47* >60 mL/min Final   Comment: (NOTE) Calculated using the CKD-EPI Creatinine Equation (2021)   . Anion gap 05/06/2020 13  5 - 15 Final   Performed at [facility], [address] [place], [place] [address]    Assessment:  SHAVONTE ZHAO is a 70 y.o. female with Stage I breast cancer presents to follow up  1. Malignant neoplasm of right breast in female, estrogen receptor positive, unspecified site of breast ([place])   2. Leukocytosis, unspecified type    #History of right breast cancer, stage I Patient has completed 5+ years of aromatase inhibitor treatments. Previously breast cancer index was obtained as patient has a lot of difficulties tolerating aromatase inhibitor. Breast cancer index showed no benefit of extended endocrine therapy, risk of late distant recurrence 1.4%.   During last visit, we had a discussion about stopping treatments given that patient has experienced moderate difficulties while on aromatase inhibitor and benefit of extended endocrine therapy is low.  She wanted to  continue Arimidex.  Today patient informs me that she has decided not to stay on extended endocrine therapy. Stop Arimidex. Recommend patient to continue calcium and vitamin D supplementation. Her bone density was normal.  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  No constitutional symptoms.  Flow cytometry was negative.  I will hold off additional work-up at this point.  RTC: We will obtain screening mammogram in November 2022 and patient follow-up with me after mammogram.  Earlie Server, MD, PhD Hematology Oncology [place] at [facility] 05/06/2020

## 2020-05-06 NOTE — Progress Notes (Signed)
Patient here for follow up. No new concerns, no new breast problems.

## 2020-05-06 NOTE — Telephone Encounter (Signed)
FYI... per 05/06/20 pts needs a Screening mammo in NOV. Per Teresa Holland from [facility] stated that mammograms has to be sched within 13mths from due date. Pts Mammogram can't be sched until after Jul 22, 2020. Pt stated that she would contact [facility] in May and get it sched. I went ahead and got her sched to RTC for lab/MD a few days after her mammo per 05/06/20 los. 01/30/21, date my have to be changed depending on the sched mammogram date.

## 2020-05-13 DIAGNOSIS — M5412 Radiculopathy, cervical region: Secondary | ICD-10-CM | POA: Diagnosis not present

## 2020-05-13 DIAGNOSIS — R600 Localized edema: Secondary | ICD-10-CM | POA: Diagnosis not present

## 2020-05-13 DIAGNOSIS — E119 Type 2 diabetes mellitus without complications: Secondary | ICD-10-CM | POA: Diagnosis not present

## 2020-06-19 DIAGNOSIS — E1169 Type 2 diabetes mellitus with other specified complication: Secondary | ICD-10-CM | POA: Diagnosis not present

## 2020-06-19 DIAGNOSIS — E782 Mixed hyperlipidemia: Secondary | ICD-10-CM | POA: Diagnosis not present

## 2020-06-26 DIAGNOSIS — Z6841 Body Mass Index (BMI) 40.0 and over, adult: Secondary | ICD-10-CM | POA: Diagnosis not present

## 2020-06-26 DIAGNOSIS — E785 Hyperlipidemia, unspecified: Secondary | ICD-10-CM | POA: Diagnosis not present

## 2020-06-26 DIAGNOSIS — E119 Type 2 diabetes mellitus without complications: Secondary | ICD-10-CM | POA: Diagnosis not present

## 2020-07-03 ENCOUNTER — Encounter: Payer: Self-pay | Admitting: Oncology

## 2020-07-11 DIAGNOSIS — E119 Type 2 diabetes mellitus without complications: Secondary | ICD-10-CM | POA: Diagnosis not present

## 2020-08-05 DIAGNOSIS — I5022 Chronic systolic (congestive) heart failure: Secondary | ICD-10-CM | POA: Diagnosis not present

## 2020-08-05 DIAGNOSIS — E119 Type 2 diabetes mellitus without complications: Secondary | ICD-10-CM | POA: Diagnosis not present

## 2020-08-05 DIAGNOSIS — M542 Cervicalgia: Secondary | ICD-10-CM | POA: Diagnosis not present

## 2020-09-25 ENCOUNTER — Emergency Department
Admission: EM | Admit: 2020-09-25 | Discharge: 2020-09-25 | Disposition: A | Discharge status: Home / Self Care | Payer: Medicare HMO | Attending: Emergency Medicine | Admitting: Emergency Medicine

## 2020-09-25 ENCOUNTER — Other Ambulatory Visit: Payer: Self-pay

## 2020-09-25 ENCOUNTER — Emergency Department: Payer: Medicare HMO

## 2020-09-25 DIAGNOSIS — Z91040 Latex allergy status: Secondary | ICD-10-CM | POA: Diagnosis not present

## 2020-09-25 DIAGNOSIS — Z87891 Personal history of nicotine dependence: Secondary | ICD-10-CM | POA: Diagnosis not present

## 2020-09-25 DIAGNOSIS — Z79899 Other long term (current) drug therapy: Secondary | ICD-10-CM | POA: Insufficient documentation

## 2020-09-25 DIAGNOSIS — E039 Hypothyroidism, unspecified: Secondary | ICD-10-CM | POA: Insufficient documentation

## 2020-09-25 DIAGNOSIS — E1122 Type 2 diabetes mellitus with diabetic chronic kidney disease: Secondary | ICD-10-CM | POA: Insufficient documentation

## 2020-09-25 DIAGNOSIS — J449 Chronic obstructive pulmonary disease, unspecified: Secondary | ICD-10-CM | POA: Insufficient documentation

## 2020-09-25 DIAGNOSIS — R0602 Shortness of breath: Secondary | ICD-10-CM | POA: Diagnosis not present

## 2020-09-25 DIAGNOSIS — J45909 Unspecified asthma, uncomplicated: Secondary | ICD-10-CM | POA: Insufficient documentation

## 2020-09-25 DIAGNOSIS — R600 Localized edema: Secondary | ICD-10-CM | POA: Diagnosis not present

## 2020-09-25 DIAGNOSIS — Z794 Long term (current) use of insulin: Secondary | ICD-10-CM | POA: Diagnosis not present

## 2020-09-25 DIAGNOSIS — R0789 Other chest pain: Secondary | ICD-10-CM | POA: Diagnosis not present

## 2020-09-25 DIAGNOSIS — Z7951 Long term (current) use of inhaled steroids: Secondary | ICD-10-CM | POA: Diagnosis not present

## 2020-09-25 DIAGNOSIS — I130 Hypertensive heart and chronic kidney disease with heart failure and stage 1 through stage 4 chronic kidney disease, or unspecified chronic kidney disease: Secondary | ICD-10-CM | POA: Insufficient documentation

## 2020-09-25 DIAGNOSIS — R609 Edema, unspecified: Secondary | ICD-10-CM

## 2020-09-25 DIAGNOSIS — N189 Chronic kidney disease, unspecified: Secondary | ICD-10-CM | POA: Diagnosis not present

## 2020-09-25 DIAGNOSIS — I5032 Chronic diastolic (congestive) heart failure: Secondary | ICD-10-CM | POA: Diagnosis not present

## 2020-09-25 DIAGNOSIS — Z853 Personal history of malignant neoplasm of breast: Secondary | ICD-10-CM | POA: Diagnosis not present

## 2020-09-25 LAB — BASIC METABOLIC PANEL
Anion gap: 13 (ref 5–15)
BUN: 14 mg/dL (ref 8–23)
CO2: 29 mmol/L (ref 22–32)
Calcium: 9.8 mg/dL (ref 8.9–10.3)
Chloride: 101 mmol/L (ref 98–111)
Creatinine, Ser: 1.19 mg/dL — ABNORMAL HIGH (ref 0.44–1.00)
GFR, Estimated: 50 mL/min — ABNORMAL LOW (ref >60)
Glucose, Bld: 150 mg/dL — ABNORMAL HIGH (ref 70–99)
Potassium: 3.9 mmol/L (ref 3.5–5.1)
Sodium: 143 mmol/L (ref 135–145)

## 2020-09-25 LAB — CBC WITH DIFFERENTIAL/PLATELET
Abs Immature Granulocytes: 0.2 10*3/uL — ABNORMAL HIGH (ref 0.00–0.07)
Basophils Absolute: 0.1 10*3/uL (ref 0.0–0.1)
Basophils Relative: 0 %
Eosinophils Absolute: 0.1 10*3/uL (ref 0.0–0.5)
Eosinophils Relative: 0 %
HCT: 39.8 % (ref 36.0–46.0)
Hemoglobin: 12.2 g/dL (ref 12.0–15.0)
Immature Granulocytes: 2 %
Lymphocytes Relative: 18 %
Lymphs Abs: 2.4 10*3/uL (ref 0.7–4.0)
MCH: 26.3 pg (ref 26.0–34.0)
MCHC: 30.7 g/dL (ref 30.0–36.0)
MCV: 86 fL (ref 80.0–100.0)
Monocytes Absolute: 0.9 10*3/uL (ref 0.1–1.0)
Monocytes Relative: 7 %
Neutro Abs: 10.1 10*3/uL — ABNORMAL HIGH (ref 1.7–7.7)
Neutrophils Relative %: 73 %
Platelets: 274 10*3/uL (ref 150–400)
RBC: 4.63 MIL/uL (ref 3.87–5.11)
RDW: 14.2 % (ref 11.5–15.5)
WBC: 13.8 10*3/uL — ABNORMAL HIGH (ref 4.0–10.5)
nRBC: 0 % (ref 0.0–0.2)

## 2020-09-25 LAB — TROPONIN I (HIGH SENSITIVITY)
Troponin I (High Sensitivity): 7 ng/L (ref <18)
Troponin I (High Sensitivity): 8 ng/L (ref <18)

## 2020-09-25 LAB — BRAIN NATRIURETIC PEPTIDE: B Natriuretic Peptide: 25.6 pg/mL (ref 0.0–100.0)

## 2020-09-25 NOTE — ED Provider Notes (Signed)
Emergency Medicine Provider Triage Evaluation Note  Teresa Holland , a 70 y.o. female  was evaluated in triage.  Pt complains of BLE swelling and SOB. She was advised to report to the ED per her PCP, due to the excess fluid. She is on 2 fluid pills currently.   Review of Systems  Positive: SOB, DOE, BLE edema Negative: Cough, abdominal pain.   Physical Exam  BP (!) 182/103 (BP Location: Right Arm)   Pulse 95   Temp 99.2 F (37.3 C) (Oral)   Resp 18   Ht '5\' 8"'$  (1.727 m)   Wt (!) 160 kg   SpO2 100%   BMI 53.63 kg/m  Gen:   Awake, no distress  NAD Resp:  Normal effort CTA MSK:   Moves extremities without difficulty BLE pitting edema Other:    Medical Decision Making  Medically screening exam initiated at 4:01 PM.  Appropriate orders placed.  [name] was informed that the remainder of the evaluation will be completed by another provider, this initial triage assessment does not replace that evaluation, and the importance of remaining in the ED until their evaluation is complete.  Patient with ED evaluation of SOB, LE edema.   Melvenia Needles, PA-C 09/25/20 1725    Naaman Plummer, MD 09/26/20 [phone]

## 2020-09-25 NOTE — ED Notes (Signed)
ED Provider at bedside. 

## 2020-09-25 NOTE — ED Provider Notes (Signed)
Or  [facility] Emergency Department Provider Note   ____________________________________________   Event Date/Time   First MD Initiated Contact with Patient 09/25/20 2041     (approximate)  I have reviewed the triage vital signs and the nursing notes.   HISTORY  Chief Complaint Chest Pain and Leg Swelling    HPI Teresa Holland is a 70 y.o. female with a past medical history as listed below who presents for bilateral lower extremity edema  LOCATION: Bilateral lower extremities DURATION: 6 months TIMING: Worsening since onset SEVERITY: Severe QUALITY: Edema CONTEXT: Patient has been placed on multiple different diuretic medications with increasing dosages however patient continues to have worsening swelling in her bilateral lower extremities and was told by her physician to present to the emergency department for further evaluation MODIFYING FACTORS: States that getting up and walking worsens this edema and laying flat or with her feet raised partially closed with ASSOCIATED SYMPTOMS: Intermittent chest "heat"   Per medical record review, patient has history of morbid obesity, type 2 diabetes, CKD and CHF          Past Medical History:  Diagnosis Date   Anxiety    Arthritis    Asthma    Back pain    Breast cancer ([place]) 2016   right breast   Cancer ([place])    breast   CHF (congestive heart failure) ([place])    Claustrophobia    SEVERE   Complication of anesthesia    heart issues 2004 during gb surgery had to be revived   Constipation    COPD (chronic obstructive pulmonary disease) ([place])    Diabetes mellitus without complication ([place])    Duodenitis    Erosive gastritis    GERD (gastroesophageal reflux disease)    Gout    Headache    Hypertension    Hypothyroidism    Obesity    Pain    chronic back   Shortness of breath dyspnea    Sleep apnea    could not use cpap uses 1.5 l [place] at night    Patient Active Problem List   Diagnosis  Date Noted   Constipation due to opioid therapy 02/23/2018   DM type 2 with diabetic mixed hyperlipidemia ([place]) 02/23/2018   History of breast cancer 05/02/2017   Tubular adenoma 12/07/2016   Morbid (severe) obesity due to excess calories ([place]) 05/29/2015   Controlled type 2 diabetes mellitus without complication ([place]) [id number]   Chronic diastolic heart failure ([place]) 01/10/2015   Cancer of lower-outer quadrant of female breast ([place]) 10/03/2014   Obstructive apnea 10/03/2014   Malignant neoplasm of lower-outer quadrant of female breast ([place]) 10/03/2014   Cancer of right breast ([place]) 09/15/2014   Acquired hypothyroidism 02/20/2014   CAFL (chronic airflow limitation) ([place]) 07/12/2013   Diabetes ([place]) 07/12/2013   Gout 07/12/2013   BP (high blood pressure) 07/12/2013   Disc disorder 07/12/2013   Arthritis, degenerative 07/12/2013   Chronic obstructive pulmonary disease ([place]) 07/12/2013   Diabetes mellitus ([place]) 07/12/2013    Past Surgical History:  Procedure Laterality Date   abd pain     ABDOMINAL HYSTERECTOMY     ANKLE FRACTURE SURGERY     BACK SURGERY     lumbar fusion   BREAST BIOPSY Left 2004   negative   BREAST BIOPSY Right 2016   positive- invasive mammary carcinoma   BREAST LUMPECTOMY Right 2016   [facility]   CATARACT EXTRACTION W/PHACO Right 06/28/2017   Procedure: CATARACT EXTRACTION PHACO  AND INTRAOCULAR LENS PLACEMENT (IOC);  Surgeon: Birder Robson, MD;  Location: [facility] ORS;  Service: Ophthalmology;  Laterality: Right;  [place] 02:20.6 AP% 20.6 CDE 28.90 Fluid Pack Lot # [id number]   CHOLECYSTECTOMY     COLONOSCOPY     COLONOSCOPY WITH PROPOFOL N/A 09/07/2017   Procedure: COLONOSCOPY WITH PROPOFOL;  Surgeon: Tol[place], Benay Pike, MD;  Location: [facility] ENDOSCOPY;  Service: Gastroenterology;  Laterality: N/A;   ESOPHAGOGASTRODUODENOSCOPY (EGD) WITH PROPOFOL N/A 02/06/2016   Procedure: ESOPHAGOGASTRODUODENOSCOPY (EGD) WITH PROPOFOL;  Surgeon: Lollie Sails, MD;  Location: [facility]  ENDOSCOPY;  Service: Endoscopy;  Laterality: N/A;   FRACTURE SURGERY Right    ankle   OOPHORECTOMY     one ovary remaining   PARTIAL MASTECTOMY WITH NEEDLE LOCALIZATION Right 09/20/2014   Procedure: PARTIAL MASTECTOMY WITH NEEDLE LOCALIZATION;  Surgeon: Leonie Green, MD;  Location: [facility] ORS;  Service: General;  Laterality: Right;   SENTINEL NODE BIOPSY Right 09/20/2014   Procedure: SENTINEL NODE BIOPSY;  Surgeon: Leonie Green, MD;  Location: [facility] ORS;  Service: General;  Laterality: Right;   TONSILLECTOMY      Prior to Admission medications   Medication Sig Start Date End Date Taking? Authorizing Provider  albuterol (VENTOLIN HFA) 108 (90 Base) MCG/ACT inhaler Inhale 2 puffs into the lungs every 6 (six) hours as needed for wheezing or shortness of breath.  02/20/14   [provider]  calcium carbonate (CALCIUM 600) 600 MG TABS tablet Take 1 tablet (600 mg total) by mouth 2 (two) times daily with a meal. 10/02/15   Lequita Asal, MD  Cholecalciferol (VITAMIN D3) 2000 UNITS capsule Take 2,000 Units by mouth daily.     [provider]  dicyclomine (BENTYL) 10 MG capsule Take 10 mg by mouth 4 (four) times daily as needed for spasms.    [provider]  Dulaglutide 0.75 MG/0.5ML SOPN Inject into the skin. 11/02/17   [provider]  enalapril (VASOTEC) 20 MG tablet Take 20 mg by mouth every morning.  02/20/14   [provider]  etodolac (LODINE) 400 MG tablet Take 400 mg by mouth 2 (two) times daily as needed.    [provider]  fluticasone (FLOVENT HFA) 110 MCG/ACT inhaler Inhale into the lungs 2 (two) times daily.    [provider]  gabapentin (NEURONTIN) 300 MG capsule Take 300 mg by mouth 3 (three) times daily as needed (for back pain).     [provider]  guaiFENesin (MUCINEX) 600 MG 12 hr tablet Take 600 mg by mouth 2 (two) times daily as needed for cough or to loosen phlegm.     [provider]   HYDROcodone-acetaminophen (NORCO/VICODIN) 5-325 MG per tablet Take 1 tablet by mouth every 6 (six) hours as needed for severe pain.  09/16/14   [provider]  levothyroxine (SYNTHROID, LEVOTHROID) 75 MCG tablet Take 75 mcg by mouth daily before breakfast.  02/20/14   [provider]  linaclotide (LINZESS) 145 MCG CAPS capsule Take 145 mcg by mouth daily before breakfast.    [provider]  loratadine (CLARITIN) 10 MG tablet Take 10 mg by mouth at bedtime.     [provider]  magnesium oxide (MAG-OX) 400 MG tablet TAKE ONE TABLET BY MOUTH TWICE DAILY 09/12/14   [provider]  metoCLOPramide (REGLAN) 5 MG tablet Take 5 mg by mouth 3 (three) times daily before meals. 2-3 qd    [provider]  metoprolol (LOPRESSOR) 50 MG tablet Take 50  mg by mouth 2 (two) times daily.  02/20/14   [provider]  omeprazole (PRILOSEC) 40 MG capsule Take by mouth. 02/26/20 02/25/21  [provider]  OVER THE COUNTER MEDICATION Take 1 tablet by mouth at bedtime. Alteril Natural Sleep Aid    [provider]  polyethylene glycol (MIRALAX / GLYCOLAX) packet Take 17 g by mouth 4 (four) times daily as needed for moderate constipation.    [provider]  potassium chloride (KLOR-CON) 10 MEQ tablet 10 mEq. PRN with Torsemide 02/08/19   [provider]  simvastatin (ZOCOR) 80 MG tablet Take 80 mg by mouth daily.  02/20/14   [provider]  triamterene-hydrochlorothiazide Jacklynn Lewis) 37.5-25 MG capsule  02/08/19   [provider]  venlafaxine [facility]) 37.5 MG tablet Take 1 tablet by mouth once daily 12/20/19   Earlie Server, MD    Allergies Aspirin, Furosemide, Latex, Meloxicam, Nsaids, and Tape  Family History  Problem Relation Age of Onset   Breast cancer Paternal Aunt        52's   Breast cancer Maternal Aunt 68   Breast cancer Maternal Aunt 67   Colon cancer Mother    Lung cancer Father    Prostate  cancer Neg Hx    Kidney cancer Neg Hx    Bladder Cancer Neg Hx     Social History Social History   Tobacco Use   Smoking status: Former    Types: E-cigarettes   Smokeless tobacco: Never   Tobacco comments:    quit 06/14 uses e cigs occasionally  Vaping Use   Vaping Use: Some days  Substance Use Topics   Alcohol use: Not Currently   Drug use: No    Review of Systems Constitutional: No fever/chills Eyes: No visual changes. ENT: No sore throat. Cardiovascular: Endorses intermittent chest pain. Respiratory: Denies shortness of breath. Gastrointestinal: No abdominal pain.  No nausea, no vomiting.  No diarrhea. Genitourinary: Negative for dysuria. Musculoskeletal: Negative for acute arthralgias Skin: Negative for rash.  Endorses bilateral lower extremity edema Neurological: Negative for headaches, weakness/numbness/paresthesias in any extremity Psychiatric: Negative for suicidal ideation/homicidal ideation   ____________________________________________   PHYSICAL EXAM:  VITAL SIGNS: ED Triage Vitals  Enc Vitals Group     BP 09/25/20 1557 (!) 182/103     Pulse Rate 09/25/20 1557 95     Resp 09/25/20 1557 18     Temp 09/25/20 1557 99.2 F (37.3 C)     Temp Source 09/25/20 1557 Oral     SpO2 09/25/20 1557 100 %     Weight 09/25/20 1558 (!) 352 lb 11.8 oz (160 kg)     Height 09/25/20 1558 '5\' 8"'$  (1.727 m)     Head Circumference --      Peak Flow --      Pain Score 09/25/20 1558 4     Pain Loc --      Pain Edu? --      Excl. in [place]? --    Constitutional: Alert and oriented. Well appearing morbidly obese African-American elderly female in no acute distress. Eyes: Conjunctivae are normal. PERRL. Head: Atraumatic. Nose: No congestion/rhinnorhea. Mouth/Throat: Mucous membranes are moist. Neck: No stridor Cardiovascular: Grossly normal heart sounds.  Good peripheral circulation.  2+ pitting edema in bilateral lower extremities to the thigh Respiratory: Normal  respiratory effort.  No retractions. Gastrointestinal: Soft and nontender. No distention. Musculoskeletal: No obvious deformities Neurologic:  Normal speech and language. No gross focal neurologic deficits are appreciated. Skin:  Skin  is warm and dry. No rash noted. Psychiatric: Mood and affect are normal. Speech and behavior are normal.  ____________________________________________   LABS (all labs ordered are listed, but only abnormal results are displayed)  Labs Reviewed  BASIC METABOLIC PANEL - Abnormal; Notable for the following components:      Result Value   Glucose, Bld 150 (*)    Creatinine, Ser 1.19 (*)    GFR, Estimated 50 (*)    All other components within normal limits  CBC WITH DIFFERENTIAL/PLATELET - Abnormal; Notable for the following components:   WBC 13.8 (*)    Neutro Abs 10.1 (*)    Abs Immature Granulocytes 0.20 (*)    All other components within normal limits  BRAIN NATRIURETIC PEPTIDE  URINALYSIS, COMPLETE (UACMP) WITH MICROSCOPIC  TROPONIN I (HIGH SENSITIVITY)  TROPONIN I (HIGH SENSITIVITY)   ____________________________________________  EKG  ED ECG REPORT I, Naaman Plummer, the attending physician, personally viewed and interpreted this ECG.  Date: 09/25/2020 EKG Time: 1606 Rate: 93 Rhythm: normal sinus rhythm QRS Axis: normal Intervals: normal ST/T Wave abnormalities: normal Narrative Interpretation: no evidence of acute ischemia  ____________________________________________  RADIOLOGY  ED MD interpretation: 2 view chest x-ray shows no evidence of acute abnormalities including no pneumonia, pneumothorax, or widened mediastinum  Official radiology report(s): DG Chest 2 View  Result Date: 09/25/2020 CLINICAL DATA:  Shortness of breath. EXAM: CHEST - 2 VIEW COMPARISON:  January 01, 2016. FINDINGS: The heart size and mediastinal contours are within normal limits. Both lungs are clear. The visualized skeletal structures are unremarkable.  IMPRESSION: No active cardiopulmonary disease. Aortic Atherosclerosis (ICD10-I70.0). Electronically Signed   By: Marijo Conception M.D.   On: 09/25/2020 16:47    ____________________________________________   PROCEDURES  Procedure(s) performed (including Critical Care):  .1-3 Lead EKG Interpretation  Date/Time: 09/25/2020 9:11 PM Performed by: Naaman Plummer, MD Authorized by: Naaman Plummer, MD     Interpretation: normal     ECG rate:  92   ECG rate assessment: normal     Rhythm: sinus rhythm     Ectopy: none     Conduction: normal     ____________________________________________   INITIAL IMPRESSION / ASSESSMENT AND PLAN / ED COURSE  As part of my medical decision making, I reviewed the following data within the electronic medical record, if available:  Nursing notes reviewed and incorporated, Labs reviewed, EKG interpreted, Old chart reviewed, Radiograph reviewed and Notes from prior ED visits reviewed and incorporated      This 70yo female presents with leg swelling of unclear etiology. DDX includes chronic venous stasis changes, lymphedema, fracture or trauma, MSK pain, and other nonemergent causes of leg swelling. PE/DVT is low on the differential due to normal vital signs without symptoms. Low suspicion for constitutional infection or metabolic derangements.  Laboratory evaluation shows no signs of derangement.  Patient's creatinine at baseline.  Patient's EKG shows no evidence of acute abnormalities.  Patient's chest x-ray clear of any pleural effusions or pulmonary edema.  Patient counseled on the importance of fluid restriction in the setting of diuretic medications and encouraged to follow-up with her primary care physician in the next 1-3 days for further evaluation and management.  Dispo: Discharge home     ____________________________________________   FINAL CLINICAL IMPRESSION(S) / ED DIAGNOSES  Final diagnoses:  Peripheral edema  Atypical chest pain      ED Discharge Orders     None        Note:  This document  was prepared using Systems analyst and may include unintentional dictation errors.    Naaman Plummer, MD 09/25/20 2204

## 2020-09-25 NOTE — ED Triage Notes (Addendum)
Pt presents to the ED from home with c/o chest "pressure" and edema to bilateral lower extremities that began yesterday. Pt states that she had had her diuretic dosages increased by PCP several times but edema has not improved. Pt contacted PCP today who told her to report to ED for further evaluation.

## 2020-10-01 ENCOUNTER — Inpatient Hospital Stay: Payer: Medicare HMO | Admitting: Anesthesiology

## 2020-10-01 ENCOUNTER — Emergency Department: Payer: Medicare HMO

## 2020-10-01 ENCOUNTER — Other Ambulatory Visit: Payer: Self-pay

## 2020-10-01 ENCOUNTER — Inpatient Hospital Stay
Admission: EM | Admit: 2020-10-01 | Discharge: 2020-10-30 | DRG: 853 | Disposition: E | Payer: Medicare HMO | Attending: Pulmonary Disease | Admitting: Pulmonary Disease

## 2020-10-01 ENCOUNTER — Encounter: Admission: EM | Disposition: E | Payer: Self-pay | Source: Home / Self Care | Attending: Pulmonary Disease

## 2020-10-01 DIAGNOSIS — Z888 Allergy status to other drugs, medicaments and biological substances status: Secondary | ICD-10-CM

## 2020-10-01 DIAGNOSIS — F419 Anxiety disorder, unspecified: Secondary | ICD-10-CM | POA: Diagnosis present

## 2020-10-01 DIAGNOSIS — R4020 Unspecified coma: Secondary | ICD-10-CM | POA: Diagnosis not present

## 2020-10-01 DIAGNOSIS — E782 Mixed hyperlipidemia: Secondary | ICD-10-CM | POA: Diagnosis present

## 2020-10-01 DIAGNOSIS — I5032 Chronic diastolic (congestive) heart failure: Secondary | ICD-10-CM | POA: Diagnosis not present

## 2020-10-01 DIAGNOSIS — Z800 Family history of malignant neoplasm of digestive organs: Secondary | ICD-10-CM

## 2020-10-01 DIAGNOSIS — A419 Sepsis, unspecified organism: Principal | ICD-10-CM | POA: Diagnosis present

## 2020-10-01 DIAGNOSIS — Z6841 Body Mass Index (BMI) 40.0 and over, adult: Secondary | ICD-10-CM | POA: Diagnosis not present

## 2020-10-01 DIAGNOSIS — R6520 Severe sepsis without septic shock: Secondary | ICD-10-CM | POA: Diagnosis not present

## 2020-10-01 DIAGNOSIS — Z803 Family history of malignant neoplasm of breast: Secondary | ICD-10-CM

## 2020-10-01 DIAGNOSIS — Z20822 Contact with and (suspected) exposure to covid-19: Secondary | ICD-10-CM | POA: Diagnosis not present

## 2020-10-01 DIAGNOSIS — F17290 Nicotine dependence, other tobacco product, uncomplicated: Secondary | ICD-10-CM | POA: Diagnosis present

## 2020-10-01 DIAGNOSIS — Z9049 Acquired absence of other specified parts of digestive tract: Secondary | ICD-10-CM

## 2020-10-01 DIAGNOSIS — R0789 Other chest pain: Secondary | ICD-10-CM | POA: Diagnosis not present

## 2020-10-01 DIAGNOSIS — K219 Gastro-esophageal reflux disease without esophagitis: Secondary | ICD-10-CM | POA: Diagnosis present

## 2020-10-01 DIAGNOSIS — M109 Gout, unspecified: Secondary | ICD-10-CM | POA: Diagnosis present

## 2020-10-01 DIAGNOSIS — N739 Female pelvic inflammatory disease, unspecified: Secondary | ICD-10-CM | POA: Diagnosis present

## 2020-10-01 DIAGNOSIS — E871 Hypo-osmolality and hyponatremia: Secondary | ICD-10-CM | POA: Diagnosis present

## 2020-10-01 DIAGNOSIS — Z515 Encounter for palliative care: Secondary | ICD-10-CM

## 2020-10-01 DIAGNOSIS — J439 Emphysema, unspecified: Secondary | ICD-10-CM | POA: Diagnosis not present

## 2020-10-01 DIAGNOSIS — W19XXXA Unspecified fall, initial encounter: Secondary | ICD-10-CM | POA: Diagnosis present

## 2020-10-01 DIAGNOSIS — Z91040 Latex allergy status: Secondary | ICD-10-CM

## 2020-10-01 DIAGNOSIS — M726 Necrotizing fasciitis: Secondary | ICD-10-CM | POA: Diagnosis not present

## 2020-10-01 DIAGNOSIS — Z87891 Personal history of nicotine dependence: Secondary | ICD-10-CM

## 2020-10-01 DIAGNOSIS — K2980 Duodenitis without bleeding: Secondary | ICD-10-CM | POA: Diagnosis present

## 2020-10-01 DIAGNOSIS — Z4682 Encounter for fitting and adjustment of non-vascular catheter: Secondary | ICD-10-CM | POA: Diagnosis not present

## 2020-10-01 DIAGNOSIS — R6521 Severe sepsis with septic shock: Secondary | ICD-10-CM | POA: Diagnosis not present

## 2020-10-01 DIAGNOSIS — I700 Atherosclerosis of aorta: Secondary | ICD-10-CM | POA: Diagnosis not present

## 2020-10-01 DIAGNOSIS — H518 Other specified disorders of binocular movement: Secondary | ICD-10-CM | POA: Diagnosis present

## 2020-10-01 DIAGNOSIS — Z79890 Hormone replacement therapy (postmenopausal): Secondary | ICD-10-CM

## 2020-10-01 DIAGNOSIS — T797XXA Traumatic subcutaneous emphysema, initial encounter: Secondary | ICD-10-CM | POA: Diagnosis present

## 2020-10-01 DIAGNOSIS — Z90710 Acquired absence of both cervix and uterus: Secondary | ICD-10-CM

## 2020-10-01 DIAGNOSIS — R0902 Hypoxemia: Secondary | ICD-10-CM | POA: Diagnosis not present

## 2020-10-01 DIAGNOSIS — Z886 Allergy status to analgesic agent status: Secondary | ICD-10-CM

## 2020-10-01 DIAGNOSIS — Z452 Encounter for adjustment and management of vascular access device: Secondary | ICD-10-CM | POA: Diagnosis not present

## 2020-10-01 DIAGNOSIS — K429 Umbilical hernia without obstruction or gangrene: Secondary | ICD-10-CM | POA: Diagnosis not present

## 2020-10-01 DIAGNOSIS — F40240 Claustrophobia: Secondary | ICD-10-CM | POA: Diagnosis present

## 2020-10-01 DIAGNOSIS — I110 Hypertensive heart disease with heart failure: Secondary | ICD-10-CM | POA: Diagnosis not present

## 2020-10-01 DIAGNOSIS — R0602 Shortness of breath: Secondary | ICD-10-CM | POA: Diagnosis not present

## 2020-10-01 DIAGNOSIS — R569 Unspecified convulsions: Secondary | ICD-10-CM | POA: Diagnosis not present

## 2020-10-01 DIAGNOSIS — Z79899 Other long term (current) drug therapy: Secondary | ICD-10-CM | POA: Diagnosis not present

## 2020-10-01 DIAGNOSIS — E039 Hypothyroidism, unspecified: Secondary | ICD-10-CM | POA: Diagnosis present

## 2020-10-01 DIAGNOSIS — Z853 Personal history of malignant neoplasm of breast: Secondary | ICD-10-CM | POA: Diagnosis not present

## 2020-10-01 DIAGNOSIS — J9601 Acute respiratory failure with hypoxia: Secondary | ICD-10-CM

## 2020-10-01 DIAGNOSIS — K611 Rectal abscess: Secondary | ICD-10-CM | POA: Diagnosis present

## 2020-10-01 DIAGNOSIS — R079 Chest pain, unspecified: Secondary | ICD-10-CM | POA: Diagnosis not present

## 2020-10-01 DIAGNOSIS — J181 Lobar pneumonia, unspecified organism: Secondary | ICD-10-CM | POA: Diagnosis not present

## 2020-10-01 DIAGNOSIS — Z7189 Other specified counseling: Secondary | ICD-10-CM | POA: Diagnosis not present

## 2020-10-01 DIAGNOSIS — J984 Other disorders of lung: Secondary | ICD-10-CM | POA: Diagnosis not present

## 2020-10-01 DIAGNOSIS — Z801 Family history of malignant neoplasm of trachea, bronchus and lung: Secondary | ICD-10-CM

## 2020-10-01 HISTORY — PX: INCISION AND DRAINAGE PERIRECTAL ABSCESS: SHX1804

## 2020-10-01 LAB — URINALYSIS, COMPLETE (UACMP) WITH MICROSCOPIC
Bilirubin Urine: NEGATIVE
Glucose, UA: NEGATIVE mg/dL
Ketones, ur: 5 mg/dL — AB
Nitrite: NEGATIVE
Protein, ur: 30 mg/dL — AB
Specific Gravity, Urine: 1.023 (ref 1.005–1.030)
pH: 5 (ref 5.0–8.0)

## 2020-10-01 LAB — BLOOD GAS, ARTERIAL
Acid-base deficit: 4.4 mmol/L — ABNORMAL HIGH (ref 0.0–2.0)
Bicarbonate: 23.2 mmol/L (ref 20.0–28.0)
FIO2: 1
MECHVT: 450 mL
Mechanical Rate: 16
O2 Saturation: 98.3 %
PEEP: 5 cmH2O
Patient temperature: 37
pCO2 arterial: 53 mmHg — ABNORMAL HIGH (ref 32.0–48.0)
pH, Arterial: 7.25 — ABNORMAL LOW (ref 7.350–7.450)
pO2, Arterial: 125 mmHg — ABNORMAL HIGH (ref 83.0–108.0)

## 2020-10-01 LAB — CBC WITH DIFFERENTIAL/PLATELET
Abs Immature Granulocytes: 1.48 10*3/uL — ABNORMAL HIGH (ref 0.00–0.07)
Basophils Absolute: 0.2 10*3/uL — ABNORMAL HIGH (ref 0.0–0.1)
Basophils Relative: 1 %
Eosinophils Absolute: 0 10*3/uL (ref 0.0–0.5)
Eosinophils Relative: 0 %
HCT: 35.8 % — ABNORMAL LOW (ref 36.0–46.0)
Hemoglobin: 11.9 g/dL — ABNORMAL LOW (ref 12.0–15.0)
Immature Granulocytes: 5 %
Lymphocytes Relative: 3 %
Lymphs Abs: 1.1 10*3/uL (ref 0.7–4.0)
MCH: 27.1 pg (ref 26.0–34.0)
MCHC: 33.2 g/dL (ref 30.0–36.0)
MCV: 81.5 fL (ref 80.0–100.0)
Monocytes Absolute: 2.5 10*3/uL — ABNORMAL HIGH (ref 0.1–1.0)
Monocytes Relative: 8 %
Neutro Abs: 27.9 10*3/uL — ABNORMAL HIGH (ref 1.7–7.7)
Neutrophils Relative %: 83 %
Platelets: 329 10*3/uL (ref 150–400)
RBC: 4.39 MIL/uL (ref 3.87–5.11)
RDW: 14.1 % (ref 11.5–15.5)
Smear Review: NORMAL
WBC: 33.1 10*3/uL — ABNORMAL HIGH (ref 4.0–10.5)
nRBC: 0.1 % (ref 0.0–0.2)

## 2020-10-01 LAB — COMPREHENSIVE METABOLIC PANEL
ALT: 47 U/L — ABNORMAL HIGH (ref 0–44)
AST: 92 U/L — ABNORMAL HIGH (ref 15–41)
Albumin: 2.9 g/dL — ABNORMAL LOW (ref 3.5–5.0)
Alkaline Phosphatase: 86 U/L (ref 38–126)
Anion gap: 19 — ABNORMAL HIGH (ref 5–15)
BUN: 34 mg/dL — ABNORMAL HIGH (ref 8–23)
CO2: 19 mmol/L — ABNORMAL LOW (ref 22–32)
Calcium: 8.7 mg/dL — ABNORMAL LOW (ref 8.9–10.3)
Chloride: 90 mmol/L — ABNORMAL LOW (ref 98–111)
Creatinine, Ser: 2.93 mg/dL — ABNORMAL HIGH (ref 0.44–1.00)
GFR, Estimated: 17 mL/min — ABNORMAL LOW (ref >60)
Glucose, Bld: 263 mg/dL — ABNORMAL HIGH (ref 70–99)
Potassium: 4.8 mmol/L (ref 3.5–5.1)
Sodium: 128 mmol/L — ABNORMAL LOW (ref 135–145)
Total Bilirubin: 2.6 mg/dL — ABNORMAL HIGH (ref 0.3–1.2)
Total Protein: 7 g/dL (ref 6.5–8.1)

## 2020-10-01 LAB — PROCALCITONIN: Procalcitonin: 34.88 ng/mL

## 2020-10-01 LAB — LACTIC ACID, PLASMA: Lactic Acid, Venous: 7 mmol/L (ref 0.5–1.9)

## 2020-10-01 LAB — TROPONIN I (HIGH SENSITIVITY): Troponin I (High Sensitivity): 798 ng/L (ref <18)

## 2020-10-01 LAB — BRAIN NATRIURETIC PEPTIDE: B Natriuretic Peptide: 287.5 pg/mL — ABNORMAL HIGH (ref 0.0–100.0)

## 2020-10-01 LAB — CBG MONITORING, ED
Glucose-Capillary: 280 mg/dL — ABNORMAL HIGH (ref 70–99)
Glucose-Capillary: 257 mg/dL — ABNORMAL HIGH (ref 70–99)

## 2020-10-01 LAB — RESP PANEL BY RT-PCR (FLU A&B, COVID) ARPGX2
Influenza A by PCR: NEGATIVE
Influenza B by PCR: NEGATIVE
SARS Coronavirus 2 by RT PCR: NEGATIVE

## 2020-10-01 SURGERY — INCISION AND DRAINAGE, ABSCESS, PERIRECTAL
Anesthesia: General | Site: Groin

## 2020-10-01 MED ORDER — SODIUM CHLORIDE 0.9 % IV BOLUS
1000.0000 mL | Freq: Once | INTRAVENOUS | Status: AC
  Administered 2020-10-01: 1000 mL via INTRAVENOUS

## 2020-10-01 MED ORDER — VANCOMYCIN HCL IN DEXTROSE 1-5 GM/200ML-% IV SOLN
1000.0000 mg | Freq: Once | INTRAVENOUS | Status: DC
  Filled 2020-10-01: qty 200

## 2020-10-01 MED ORDER — POLYETHYLENE GLYCOL 3350 17 G PO PACK: 17.0000 g | PACK | Freq: Every day | ORAL | Status: DC | PRN

## 2020-10-01 MED ORDER — ROCURONIUM BROMIDE 100 MG/10ML IV SOLN
INTRAVENOUS | Status: DC | PRN
  Administered 2020-10-01: 50 mg via INTRAVENOUS

## 2020-10-01 MED ORDER — SODIUM CHLORIDE 0.9 % IV BOLUS
500.0000 mL | Freq: Once | INTRAVENOUS | Status: AC
  Administered 2020-10-01: 500 mL via INTRAVENOUS

## 2020-10-01 MED ORDER — LORAZEPAM 2 MG/ML IJ SOLN: 4.0000 mg | Freq: Once | INTRAMUSCULAR | Status: AC

## 2020-10-01 MED ORDER — SODIUM CHLORIDE 0.9 % IV SOLN
2000.0000 mg | Freq: Once | INTRAVENOUS | Status: AC
  Administered 2020-10-01: 2000 mg via INTRAVENOUS
  Filled 2020-10-01: qty 20

## 2020-10-01 MED ORDER — SODIUM CHLORIDE 0.9 % IV SOLN
2.0000 g | Freq: Once | INTRAVENOUS | Status: AC
  Administered 2020-10-01: 2 g via INTRAVENOUS
  Filled 2020-10-01: qty 2

## 2020-10-01 MED ORDER — LORAZEPAM 2 MG/ML IJ SOLN
INTRAMUSCULAR | Status: AC
  Administered 2020-10-01: 4 mg via INTRAVENOUS
  Filled 2020-10-01: qty 1

## 2020-10-01 MED ORDER — ETOMIDATE 2 MG/ML IV SOLN
INTRAVENOUS | Status: AC | PRN
  Administered 2020-10-01: 10 mg via INTRAVENOUS

## 2020-10-01 MED ORDER — MIDAZOLAM 50MG/50ML (1MG/ML) PREMIX INFUSION: 0.5000 mg/h | INTRAVENOUS | Status: DC

## 2020-10-01 MED ORDER — CLINDAMYCIN PHOSPHATE 900 MG/50ML IV SOLN
900.0000 mg | Freq: Once | INTRAVENOUS | Status: AC
  Administered 2020-10-01: 900 mg via INTRAVENOUS
  Filled 2020-10-01: qty 50

## 2020-10-01 MED ORDER — VANCOMYCIN HCL IN DEXTROSE 1-5 GM/200ML-% IV SOLN: 1000.0000 mg | Freq: Once | INTRAVENOUS | Status: DC

## 2020-10-01 MED ORDER — PANTOPRAZOLE SODIUM 40 MG IV SOLR
40.0000 mg | Freq: Every day | INTRAVENOUS | Status: DC
  Administered 2020-10-01: 40 mg via INTRAVENOUS
  Filled 2020-10-01: qty 40

## 2020-10-01 MED ORDER — PHENYLEPHRINE HCL (PRESSORS) 10 MG/ML IV SOLN
INTRAVENOUS | Status: AC
  Filled 2020-10-01: qty 1

## 2020-10-01 MED ORDER — ETOMIDATE 2 MG/ML IV SOLN
INTRAVENOUS | Status: AC
  Filled 2020-10-01: qty 10

## 2020-10-01 MED ORDER — VANCOMYCIN HCL 1500 MG/300ML IV SOLN: 1500.0000 mg | INTRAVENOUS | Status: DC

## 2020-10-01 MED ORDER — VASOPRESSIN 20 UNIT/ML IV SOLN
INTRAVENOUS | Status: DC | PRN
  Administered 2020-10-01 – 2020-10-02 (×3): 1 [IU] via INTRAVENOUS

## 2020-10-01 MED ORDER — LORAZEPAM 2 MG/ML IJ SOLN
INTRAMUSCULAR | Status: AC
  Administered 2020-10-01: 2 mg via INTRAVENOUS
  Filled 2020-10-01: qty 2

## 2020-10-01 MED ORDER — METRONIDAZOLE 500 MG/100ML IV SOLN
500.0000 mg | Freq: Once | INTRAVENOUS | Status: DC
  Administered 2020-10-01: 500 mg via INTRAVENOUS
  Filled 2020-10-01: qty 100

## 2020-10-01 MED ORDER — ACETAMINOPHEN 10 MG/ML IV SOLN
1000.0000 mg | Freq: Four times a day (QID) | INTRAVENOUS | Status: DC
  Administered 2020-10-01: 1000 mg via INTRAVENOUS
  Filled 2020-10-01 (×3): qty 100

## 2020-10-01 MED ORDER — PIPERACILLIN-TAZOBACTAM 3.375 G IVPB 30 MIN
3.3750 g | Freq: Once | INTRAVENOUS | Status: AC
  Administered 2020-10-01: 3.375 g via INTRAVENOUS
  Filled 2020-10-01: qty 50

## 2020-10-01 MED ORDER — ROCURONIUM BROMIDE 50 MG/5ML IV SOLN
INTRAVENOUS | Status: AC | PRN
  Administered 2020-10-01: 100 mg via INTRAVENOUS

## 2020-10-01 MED ORDER — SODIUM CHLORIDE 0.9 % IV SOLN
2.0000 g | Freq: Three times a day (TID) | INTRAVENOUS | Status: DC
  Administered 2020-10-02: 2 g via INTRAVENOUS
  Filled 2020-10-01 (×3): qty 2

## 2020-10-01 MED ORDER — ATROPINE SULFATE 0.4 MG/ML IJ SOLN
INTRAMUSCULAR | Status: AC
  Filled 2020-10-01: qty 1

## 2020-10-01 MED ORDER — LORAZEPAM 2 MG/ML IJ SOLN: 2.0000 mg | Freq: Once | INTRAMUSCULAR | Status: AC

## 2020-10-01 MED ORDER — SODIUM CHLORIDE 0.9 % IV SOLN: 250.0000 mL | INTRAVENOUS | Status: DC

## 2020-10-01 MED ORDER — BUPIVACAINE-EPINEPHRINE (PF) 0.25% -1:200000 IJ SOLN
INTRAMUSCULAR | Status: AC
  Filled 2020-10-01: qty 30

## 2020-10-01 MED ORDER — CLINDAMYCIN PHOSPHATE 900 MG/50ML IV SOLN
900.0000 mg | Freq: Three times a day (TID) | INTRAVENOUS | Status: DC
  Administered 2020-10-02: 900 mg via INTRAVENOUS
  Filled 2020-10-01 (×5): qty 50

## 2020-10-01 MED ORDER — VANCOMYCIN HCL 1500 MG/300ML IV SOLN
1500.0000 mg | Freq: Once | INTRAVENOUS | Status: AC
  Administered 2020-10-01: 1500 mg via INTRAVENOUS
  Filled 2020-10-01: qty 300

## 2020-10-01 MED ORDER — NALOXONE HCL 2 MG/2ML IJ SOSY
PREFILLED_SYRINGE | INTRAMUSCULAR | Status: AC
  Administered 2020-10-01: 2 ug via INTRAVENOUS
  Filled 2020-10-01: qty 2

## 2020-10-01 MED ORDER — FENTANYL 2500MCG IN NS 250ML (10MCG/ML) PREMIX INFUSION: 0.0000 ug/h | INTRAVENOUS | Status: DC

## 2020-10-01 MED ORDER — LACTATED RINGERS IV BOLUS (SEPSIS): 1000.0000 mL | Freq: Once | INTRAVENOUS | Status: DC

## 2020-10-01 MED ORDER — DOCUSATE SODIUM 100 MG PO CAPS: 100.0000 mg | ORAL_CAPSULE | Freq: Two times a day (BID) | ORAL | Status: DC | PRN

## 2020-10-01 MED ORDER — NOREPINEPHRINE 4 MG/250ML-% IV SOLN
5.0000 ug/min | INTRAVENOUS | Status: DC
  Administered 2020-10-01: 5 ug/min via INTRAVENOUS
  Administered 2020-10-02: 25 ug/min via INTRAVENOUS
  Filled 2020-10-01: qty 250

## 2020-10-01 MED ORDER — LACTATED RINGERS IV BOLUS
1000.0000 mL | Freq: Once | INTRAVENOUS | Status: AC
  Administered 2020-10-01: 1000 mL via INTRAVENOUS

## 2020-10-01 SURGICAL SUPPLY — 28 items
BNDG GAUZE ELAST 4 BULKY (GAUZE/BANDAGES/DRESSINGS) ×6 IMPLANT
DRAPE LAPAROTOMY 77X122 PED (DRAPES) ×2 IMPLANT
DRAPE LEGGINS SURG 28X43 STRL (DRAPES) ×2 IMPLANT
DRAPE UNDER BUTTOCK W/FLU (DRAPES) ×2 IMPLANT
ELECT CAUTERY BLADE 6.4 (BLADE) ×2 IMPLANT
ELECT REM PT RETURN 9FT ADLT (ELECTROSURGICAL) ×2
ELECTRODE REM PT RTRN 9FT ADLT (ELECTROSURGICAL) ×1 IMPLANT
GAUZE 4X4 16PLY ~~LOC~~+RFID DBL (SPONGE) ×2 IMPLANT
GAUZE PACK 2X3YD (PACKING) ×6 IMPLANT
GAUZE SPONGE 4X4 12PLY STRL (GAUZE/BANDAGES/DRESSINGS) ×2 IMPLANT
GOWN STRL REUS W/ TWL LRG LVL3 (GOWN DISPOSABLE) ×3 IMPLANT
GOWN STRL REUS W/TWL LRG LVL3 (GOWN DISPOSABLE) ×3
KIT TURNOVER KIT A (KITS) ×2 IMPLANT
MANIFOLD NEPTUNE II (INSTRUMENTS) ×2 IMPLANT
MAT GRAY ABSORB FLUID 28X50 (MISCELLANEOUS) ×2 IMPLANT
MAT PREVALON FULL STRYKER (MISCELLANEOUS) ×2 IMPLANT
NEEDLE HYPO 22GX1.5 SAFETY (NEEDLE) ×2 IMPLANT
NS IRRIG 1000ML POUR BTL (IV SOLUTION) ×2 IMPLANT
PACK BASIN MINOR ARMC (MISCELLANEOUS) ×2 IMPLANT
PULSAVAC PLUS IRRIG FAN TIP (DISPOSABLE) ×2
SOL PREP PVP 2OZ (MISCELLANEOUS) ×4
SOLUTION PREP PVP 2OZ (MISCELLANEOUS) ×2 IMPLANT
SPONGE T-LAP 18X18 ~~LOC~~+RFID (SPONGE) ×8 IMPLANT
SUT ETHILON 3-0 FS-10 30 BLK (SUTURE) ×2
SUTURE EHLN 3-0 FS-10 30 BLK (SUTURE) ×1 IMPLANT
SYR 10ML LL (SYRINGE) ×2 IMPLANT
SYR BULB IRRIG 60ML STRL (SYRINGE) ×2 IMPLANT
TIP FAN IRRIG PULSAVAC PLUS (DISPOSABLE) ×1 IMPLANT

## 2020-10-01 NOTE — Consult Note (Signed)
CODE SEPSIS - PHARMACY COMMUNICATION  **Broad Spectrum Antibiotics should be administered within 1 hour of Sepsis diagnosis**  Time Code Sepsis Called/Page Received: 2011  Antibiotics Ordered: 2011  Time of 1st antibiotic administration: 2059       Dorothe Pea ,PharmD, BCPS Clinical Pharmacist  10/15/2020  8:12 PM

## 2020-10-01 NOTE — Anesthesia Preprocedure Evaluation (Addendum)
Anesthesia Evaluation  Patient identified by MRN, date of birth, ID band Patient unresponsive    Reviewed: Allergy & Precautions, H&P , Patient's Chart, lab work & pertinent test resultsPreop documentation limited or incomplete due to emergent nature of procedure.  History of Anesthesia Complications (+) AWARENESS UNDER ANESTHESIA and history of anesthetic complications (Per chart- heart issues 2004 during gb surgery had to be revived)  Airway       Comment: ETT in place at 25 at [facility]   Pulmonary shortness of breath, asthma , sleep apnea and Continuous Positive Airway Pressure Ventilation , pneumonia (Seen on CT scan), COPD, Patient abstained from smoking., former smoker,  Counseled to use CPAP tonight   Pulmonary exam normal        Cardiovascular hypertension, +CHF   Rhythm:Regular Rate:Tachycardia + Peripheral Edema Troponemia  ECHO 2020: INTERPRETATION  MILD LV SYSTOLIC DYSFUNCTION (See above)  NORMAL RIGHT VENTRICULAR SYSTOLIC FUNCTION  MILD VALVULAR REGURGITATION (See above)  NO VALVULAR STENOSIS     Neuro/Psych  Headaches, Anxiety Seizure today treated with keppra    GI/Hepatic PUD, GERD  ,  Endo/Other  diabetes (Ketone + today), Type 2Hypothyroidism Morbid obesity  Renal/GU      Musculoskeletal  (+) Arthritis  (Gout),   Abdominal (+) + obese,   Peds  Hematology   Anesthesia Other Findings Unresponsive with no spontaneous movement  Perineal necrotizing fasciitis- Pt presented to ED today with SOB on CPAP and was evaluated for ACS, PNA, infection and found to have a lactic acidosis. She was intubated for respiratory failure and emperic antibiotics were started with fluid and norepinephrine gtt to treat hemodynamic instability.   CT Head/Chest/Adb/Pelvis: IMPRESSION: 1. No acute intracranial abnormality. 2. Right lower lobe infection/inflammation. Followup PA and lateral chest X-ray is recommended  in 3-4 weeks following therapy to ensure resolution and exclude underlying malignancy. 3. Marked perirectal emphysema tracking along the extraperitoneal right pelvis. Associated partially visualized perineal/perirectal subcutaneus soft tissue emphysema. Markedly limited evaluation on this noncontrast study. Concern for infection such as a necrotizing fasciitis. 4. Tiny fat containing umbilical hernia. 5. Aortic Atherosclerosis (ICD10-I70.0) - severe. 6. Endotracheal tube terminates 1.5 cm above the carina. 7. Enteric tube in good position.  ED ECG REPORT  Sinus tachycardia rate of 110, no ST elevation, no T wave inversions, normal intervals.  A lot of artifact noted ____________________________________________  RADIOLOGY Robert Bellow, personally viewed and evaluated these images (plain radiographs) as part of my medical decision making, as well as reviewing the written report by the radiologist.   Official radiology report(s): DG Chest Portable 1 View  Result Date: 10/18/2020 CLINICAL DATA:  Short of breath, lower extremity edema EXAM: PORTABLE CHEST 1 VIEW COMPARISON:  09/25/2020 FINDINGS: Single frontal view of the chest demonstrates persistent enlargement the cardiac silhouette. There is increased central vascular congestion, without airspace disease, effusion, or pneumothorax. No acute bony abnormalities. IMPRESSION: 1. Increased vascular congestion without overt edema. Electronically Signed   By: Randa Ngo M.D.   On: 10/09/2020 19:54  Past Medical History: No date: Anxiety No date: Arthritis No date: Asthma No date: Back pain 2016: Breast cancer ([place])     Comment:  right breast No date: [facility])     Comment:  breast No date: CHF (congestive heart failure) (HCC) No date: Claustrophobia     Comment:  SEVERE No date: Complication of anesthesia     Comment:  heart issues 2004 during gb surgery had to be revived No date: Constipation No date:  COPD (chronic  obstructive pulmonary disease) (H[facility]) No date: Diabetes mellitus without complication (H[facility]) No date: Duodenitis No date: Erosive gastritis No date: GERD (gastroesophageal reflux disease) No date: Gout No date: Headache No date: Hypertension No date: Hypothyroidism No date: Obesity No date: Pain     Comment:  chronic back No date: Shortness of breath dyspnea No date: Sleep apnea     Comment:  could not use cpap uses 1.5 l [place] at night  Past Surgical History: No date: abd pain No date: ABDOMINAL HYSTERECTOMY No date: ANKLE FRACTURE SURGERY No date: BACK SURGERY     Comment:  lumbar fusion 2004: BREAST BIOPSY; Left     Comment:  negative 06/28/2017: CATARACT EXTRACTION W/PHACO; Right     Comment:  Procedure: CATARACT EXTRACTION PHACO AND INTRAOCULAR               LENS PLACEMENT (IOC);  Surgeon: Birder Robson, MD;                Location: [facility] ORS;  Service: Ophthalmology;  Laterality:              Right;  [place] 02:20.6 AP% 20.6 CDE 28.90 Fluid Pack Lot #              [id number] No date: CHOLECYSTECTOMY No date: COLONOSCOPY 02/06/2016: ESOPHAGOGASTRODUODENOSCOPY (EGD) WITH PROPOFOL; N/A     Comment:  Procedure: ESOPHAGOGASTRODUODENOSCOPY (EGD) WITH               PROPOFOL;  Surgeon: Lollie Sails, MD;  Location:               [facility] ENDOSCOPY;  Service: Endoscopy;  Laterality: N/A; No date: FRACTURE SURGERY; Right     Comment:  ankle 2016: MASTECTOMY; Right No date: OOPHORECTOMY     Comment:  one ovary remaining 09/20/2014: PARTIAL MASTECTOMY WITH NEEDLE LOCALIZATION; Right     Comment:  Procedure: PARTIAL MASTECTOMY WITH NEEDLE LOCALIZATION;               Surgeon: Leonie Green, MD;  Location: [facility] ORS;                Service: General;  Laterality: Right; 09/20/2014: SENTINEL NODE BIOPSY; Right     Comment:  Procedure: SENTINEL NODE BIOPSY;  Surgeon: Leonie Green, MD;  Location: [facility] ORS;  Service: General;                Laterality: Right; No  date: TONSILLECTOMY     Reproductive/Obstetrics                            Anesthesia Physical  Anesthesia Plan  ASA: 5 and emergent  Anesthesia Plan: General   Post-op Pain Management:    Induction: Intravenous, Cricoid pressure planned and Rapid sequence  PONV Risk Score and Plan: 3 and Treatment may vary due to age or medical condition, Ondansetron, Dexamethasone and Midazolam  Airway Management Planned: Oral ETT  Additional Equipment: Arterial line  Intra-op Plan:   Post-operative Plan: Post-operative intubation/ventilation  Informed Consent: I have reviewed the patients History and Physical, chart, labs and discussed the procedure including the risks, benefits and alternatives for the proposed anesthesia with the patient or authorized representative who has indicated his/her understanding and acceptance.     Consent reviewed with POA  Plan Discussed with: CRNA and Anesthesiologist  Anesthesia Plan Comments:  Anesthesia Quick Evaluation  

## 2020-10-01 NOTE — ED Notes (Signed)
Pt taken to or, gave report to icu nurse

## 2020-10-01 NOTE — ED Provider Notes (Signed)
[facility] Emergency Department Provider Note  ____________________________________________   Event Date/Time   First MD Initiated Contact with Patient 10/14/2020 1922     (approximate)  I have reviewed the triage vital signs and the nursing notes.   HISTORY  Chief Complaint Shortness of Breath    HPI Teresa Holland is a 70 y.o. female with CHF, hypertension who comes in with concerns for shortness of breath.  Patient reports having a fall today.  Patient is adamant that she did not hit her head.  States that she is just feeling more short of breath throughout the day.  She reports having worsening swelling in her legs and not taking her medications at home for the past few days and has not had a urinary output.  Her shortness of breath is severe, constant, worse with exertion, better at rest.  Denies any abdominal pain.  After leaving the room patient had an episode of unresponsiveness witnessed by the nurse.  Eyes were deviated to the right.  When I entered the room patient was still spontaneously breathing but had eye deviation.  Patient was given 2 mg of Ativan.  IV deviation continued and patient was given 4 mg of Ativan.  Eye deviation broke but patient was not protecting her airway and on the BiPAP for the respiratory issues therefore patient will need to be intubated.           Past Medical History:  Diagnosis Date   Anxiety    Arthritis    Asthma    Back pain    Breast cancer ([place]) 2016   right breast   Cancer ([place])    breast   CHF (congestive heart failure) ([place])    Claustrophobia    SEVERE   Complication of anesthesia    heart issues 2004 during gb surgery had to be revived   Constipation    COPD (chronic obstructive pulmonary disease) (HCC)    Diabetes mellitus without complication (HCC)    Duodenitis    Erosive gastritis    GERD (gastroesophageal reflux disease)    Gout    Headache    Hypertension    Hypothyroidism     Obesity    Pain    chronic back   Shortness of breath dyspnea    Sleep apnea    could not use cpap uses 1.5 l [place] at night    Patient Active Problem List   Diagnosis Date Noted   Constipation due to opioid therapy 02/23/2018   DM type 2 with diabetic mixed hyperlipidemia ([place]) 02/23/2018   History of breast cancer 05/02/2017   Tubular adenoma 12/07/2016   Morbid (severe) obesity due to excess calories ([place]) 05/29/2015   Controlled type 2 diabetes mellitus without complication ([place]) [id number]   Chronic diastolic heart failure ([place]) 01/10/2015   Cancer of lower-outer quadrant of female breast ([place]) 10/03/2014   Obstructive apnea 10/03/2014   Malignant neoplasm of lower-outer quadrant of female breast ([place]) 10/03/2014   Cancer of right breast ([place]) 09/15/2014   Acquired hypothyroidism 02/20/2014   CAFL (chronic airflow limitation) ([place]) 07/12/2013   Diabetes ([place]) 07/12/2013   Gout 07/12/2013   BP (high blood pressure) 07/12/2013   Disc disorder 07/12/2013   Arthritis, degenerative 07/12/2013   Chronic obstructive pulmonary disease ([place]) 07/12/2013   Diabetes mellitus ([place]) 07/12/2013    Past Surgical History:  Procedure Laterality Date   abd pain     ABDOMINAL HYSTERECTOMY     ANKLE FRACTURE SURGERY  BACK SURGERY     lumbar fusion   BREAST BIOPSY Left 2004   negative   BREAST BIOPSY Right 2016   positive- invasive mammary carcinoma   BREAST LUMPECTOMY Right 2016   [facility]   CATARACT EXTRACTION W/PHACO Right 06/28/2017   Procedure: CATARACT EXTRACTION PHACO AND INTRAOCULAR LENS PLACEMENT (IOC);  Surgeon: Birder Robson, MD;  Location: [facility] ORS;  Service: Ophthalmology;  Laterality: Right;  [place] 02:20.6 AP% 20.6 CDE 28.90 Fluid Pack Lot # [id number]   CHOLECYSTECTOMY     COLONOSCOPY     COLONOSCOPY WITH PROPOFOL N/A 09/07/2017   Procedure: COLONOSCOPY WITH PROPOFOL;  Surgeon: Tol[place], Benay Pike, MD;  Location: [facility] ENDOSCOPY;  Service: Gastroenterology;  Laterality: N/A;    ESOPHAGOGASTRODUODENOSCOPY (EGD) WITH PROPOFOL N/A 02/06/2016   Procedure: ESOPHAGOGASTRODUODENOSCOPY (EGD) WITH PROPOFOL;  Surgeon: Lollie Sails, MD;  Location: [facility] ENDOSCOPY;  Service: Endoscopy;  Laterality: N/A;   FRACTURE SURGERY Right    ankle   OOPHORECTOMY     one ovary remaining   PARTIAL MASTECTOMY WITH NEEDLE LOCALIZATION Right 09/20/2014   Procedure: PARTIAL MASTECTOMY WITH NEEDLE LOCALIZATION;  Surgeon: Leonie Green, MD;  Location: [facility] ORS;  Service: General;  Laterality: Right;   SENTINEL NODE BIOPSY Right 09/20/2014   Procedure: SENTINEL NODE BIOPSY;  Surgeon: Leonie Green, MD;  Location: [facility] ORS;  Service: General;  Laterality: Right;   TONSILLECTOMY      Prior to Admission medications   Medication Sig Start Date End Date Taking? Authorizing Provider  albuterol (VENTOLIN HFA) 108 (90 Base) MCG/ACT inhaler Inhale 2 puffs into the lungs every 6 (six) hours as needed for wheezing or shortness of breath.  02/20/14   [provider]  calcium carbonate (CALCIUM 600) 600 MG TABS tablet Take 1 tablet (600 mg total) by mouth 2 (two) times daily with a meal. 10/02/15   Lequita Asal, MD  Cholecalciferol (VITAMIN D3) 2000 UNITS capsule Take 2,000 Units by mouth daily.     [provider]  dicyclomine (BENTYL) 10 MG capsule Take 10 mg by mouth 4 (four) times daily as needed for spasms.    [provider]  Dulaglutide 0.75 MG/0.5ML SOPN Inject into the skin. 11/02/17   [provider]  enalapril (VASOTEC) 20 MG tablet Take 20 mg by mouth every morning.  02/20/14   [provider]  etodolac (LODINE) 400 MG tablet Take 400 mg by mouth 2 (two) times daily as needed.    [provider]  fluticasone (FLOVENT HFA) 110 MCG/ACT inhaler Inhale into the lungs 2 (two) times daily.    [provider]  gabapentin (NEURONTIN) 300 MG capsule Take 300 mg by mouth 3 (three) times daily as needed (for back pain).      [provider]  guaiFENesin (MUCINEX) 600 MG 12 hr tablet Take 600 mg by mouth 2 (two) times daily as needed for cough or to loosen phlegm.     [provider]  HYDROcodone-acetaminophen (NORCO/VICODIN) 5-325 MG per tablet Take 1 tablet by mouth every 6 (six) hours as needed for severe pain.  09/16/14   [provider]  levothyroxine (SYNTHROID, LEVOTHROID) 75 MCG tablet Take 75 mcg by mouth daily before breakfast.  02/20/14   [provider]  linaclotide (LINZESS) 145 MCG CAPS capsule Take 145 mcg by mouth daily before breakfast.    [provider]  loratadine (CLARITIN) 10 MG tablet Take 10 mg by mouth at bedtime.     [provider]  magnesium  oxide (MAG-OX) 400 MG tablet TAKE ONE TABLET BY MOUTH TWICE DAILY 09/12/14   [provider]  metoCLOPramide (REGLAN) 5 MG tablet Take 5 mg by mouth 3 (three) times daily before meals. 2-3 qd    [provider]  metoprolol (LOPRESSOR) 50 MG tablet Take 50 mg by mouth 2 (two) times daily.  02/20/14   [provider]  omeprazole (PRILOSEC) 40 MG capsule Take by mouth. 02/26/20 02/25/21  [provider]  OVER THE COUNTER MEDICATION Take 1 tablet by mouth at bedtime. Alteril Natural Sleep Aid    [provider]  polyethylene glycol (MIRALAX / GLYCOLAX) packet Take 17 g by mouth 4 (four) times daily as needed for moderate constipation.    [provider]  potassium chloride (KLOR-CON) 10 MEQ tablet 10 mEq. PRN with Torsemide 02/08/19   [provider]  simvastatin (ZOCOR) 80 MG tablet Take 80 mg by mouth daily.  02/20/14   [provider]  triamterene-hydrochlorothiazide Jacklynn Lewis) 37.5-25 MG capsule  02/08/19   [provider]  venlafaxine [facility]) 37.5 MG tablet Take 1 tablet by mouth once daily 12/20/19   Earlie Server, MD    Allergies Aspirin, Furosemide, Latex, Meloxicam, Nsaids, and Tape  Family History  Problem Relation Age  of Onset   Breast cancer Paternal Aunt        35's   Breast cancer Maternal Aunt 68   Breast cancer Maternal Aunt 67   Colon cancer Mother    Lung cancer Father    Prostate cancer Neg Hx    Kidney cancer Neg Hx    Bladder Cancer Neg Hx     Social History Social History   Tobacco Use   Smoking status: Former    Types: E-cigarettes   Smokeless tobacco: Never   Tobacco comments:    quit 06/14 uses e cigs occasionally  Vaping Use   Vaping Use: Some days  Substance Use Topics   Alcohol use: Not Currently   Drug use: No      Review of Systems Constitutional: No fever/chills Eyes: No visual changes. ENT: No sore throat. Cardiovascular: No chest pain Respiratory: Positive for SOB Gastrointestinal: No abdominal pain.  No nausea, no vomiting.  No diarrhea.  No constipation. Genitourinary: Negative for dysuria. Musculoskeletal: Negative for back pain. Skin: Negative for rash. Neurological: Negative for headaches, focal weakness or numbness. All other ROS negative ____________________________________________   PHYSICAL EXAM:  VITAL SIGNS: Blood pressure (!) 63/34, pulse (!) 113, temperature (!) 102.2 F (39 C), resp. rate 19, SpO2 96 %.   Constitutional: Alert and oriented.  Patient is on the BiPAP mask but looking comfortable Eyes: Conjunctivae are normal. EOMI. Head: Atraumatic. Nose: No congestion/rhinnorhea. Mouth/Throat: Mucous membranes are moist.   Neck: No stridor. Trachea Midline. FROM Cardiovascular: Normal rate, regular rhythm. Grossly normal heart sounds.  Good peripheral circulation. Respiratory: Patient on BiPAP, lungs sound clear overall Gastrointestinal: Soft and nontender. No distention. No abdominal bruits.  Musculoskeletal: No lower extremity tenderness nor edema.  No joint effusions. Neurologic:  Normal speech and language. No gross focal neurologic deficits are appreciated.  Skin:  Skin is warm, dry and intact. No rash noted. Psychiatric: Mood  and affect are normal. Speech and behavior are normal. GU: Deferred   ____________________________________________   LABS (all labs ordered are listed, but only abnormal results are displayed)  Labs Reviewed  CBC WITH DIFFERENTIAL/PLATELET - Abnormal; Notable for the following components:      Result Value   WBC 33.1 (*)  Hemoglobin 11.9 (*)    HCT 35.8 (*)    Neutro Abs 27.9 (*)    Monocytes Absolute 2.5 (*)    Basophils Absolute 0.2 (*)    Abs Immature Granulocytes 1.48 (*)    All other components within normal limits  COMPREHENSIVE METABOLIC PANEL - Abnormal; Notable for the following components:   Sodium 128 (*)    Chloride 90 (*)    CO2 19 (*)    Glucose, Bld 263 (*)    BUN 34 (*)    Creatinine, Ser 2.93 (*)    Calcium 8.7 (*)    Albumin 2.9 (*)    AST 92 (*)    ALT 47 (*)    Total Bilirubin 2.6 (*)    GFR, Estimated 17 (*)    Anion gap 19 (*)    All other components within normal limits  BRAIN NATRIURETIC PEPTIDE - Abnormal; Notable for the following components:   B Natriuretic Peptide 287.5 (*)    All other components within normal limits  URINALYSIS, COMPLETE (UACMP) WITH MICROSCOPIC - Abnormal; Notable for the following components:   Color, Urine AMBER (*)    APPearance CLOUDY (*)    Hgb urine dipstick SMALL (*)    Ketones, ur 5 (*)    Protein, ur 30 (*)    Leukocytes,Ua SMALL (*)    Bacteria, UA MANY (*)    All other components within normal limits  LACTIC ACID, PLASMA - Abnormal; Notable for the following components:   Lactic Acid, Venous 7.0 (*)    All other components within normal limits  CBG MONITORING, ED - Abnormal; Notable for the following components:   Glucose-Capillary 280 (*)    All other components within normal limits  CBG MONITORING, ED - Abnormal; Notable for the following components:   Glucose-Capillary 257 (*)    All other components within normal limits  TROPONIN I (HIGH SENSITIVITY) - Abnormal; Notable for the following  components:   Troponin I (High Sensitivity) 798 (*)    All other components within normal limits  RESP PANEL BY RT-PCR (FLU A&B, COVID) ARPGX2  BLOOD GAS, ARTERIAL  LACTIC ACID, PLASMA  PROCALCITONIN  PROCALCITONIN  TROPONIN I (HIGH SENSITIVITY)   ____________________________________________   ED ECG REPORT I, Vanessa [place], the attending physician, personally viewed and interpreted this ECG.  Sinus tachycardia rate of 110, no ST elevation, no T wave inversions, normal intervals.  A lot of artifact noted ____________________________________________  RADIOLOGY Robert [place], personally viewed and evaluated these images (plain radiographs) as part of my medical decision making, as well as reviewing the written report by the radiologist.  ED MD interpretation: Some increased vascular congestion noted  Official radiology report(s): DG Chest Portable 1 View  Result Date: 10/07/2020 CLINICAL DATA:  Short of breath, lower extremity edema EXAM: PORTABLE CHEST 1 VIEW COMPARISON:  09/25/2020 FINDINGS: Single frontal view of the chest demonstrates persistent enlargement the cardiac silhouette. There is increased central vascular congestion, without airspace disease, effusion, or pneumothorax. No acute bony abnormalities. IMPRESSION: 1. Increased vascular congestion without overt edema. Electronically Signed   By: Randa Ngo M.D.   On: 10/07/2020 19:54    ____________________________________________   PROCEDURES  Procedure(s) performed (including Critical Care):  .Critical Care  Date/Time: 10/13/2020 9:39 PM Performed by: Vanessa [place], MD Authorized by: Vanessa , MD   Critical care provider statement:    Critical care time (minutes):  75   Critical care was necessary to treat or prevent  imminent or life-threatening deterioration of the following conditions:  Sepsis and respiratory failure   Critical care was time spent personally by me on the following activities:   Discussions with consultants, evaluation of patient's response to treatment, examination of patient, ordering and performing treatments and interventions, ordering and review of laboratory studies, ordering and review of radiographic studies, pulse oximetry, re-evaluation of patient's condition, obtaining history from patient or surrogate and review of old charts .1-3 Lead EKG Interpretation  Date/Time: 10/05/2020 9:39 PM Performed by: Vanessa [place], MD Authorized by: Vanessa [place], MD     Interpretation: abnormal     ECG rate:  110   ECG rate assessment: tachycardic     Rhythm: sinus tachycardia     Ectopy: none     Conduction: normal   Procedure Name: Intubation Date/Time: 10/18/2020 11:22 PM Performed by: Vanessa [place], MD Pre-anesthesia Checklist: Patient identified Oxygen Delivery Method: Ambu bag Preoxygenation: Pre-oxygenation with 100% oxygen Induction Type: Rapid sequence Laryngoscope Size: Glidescope Tube size: 7.5 mm Number of attempts: 1      ____________________________________________   INITIAL IMPRESSION / ASSESSMENT AND PLAN / ED COURSE   Teresa Holland was evaluated in Emergency Department on 10/04/2020 for the symptoms described in the history of present illness. She was evaluated in the context of the global COVID-19 pandemic, which necessitated consideration that the patient might be at risk for infection with the SARS-CoV-2 virus that causes COVID-19. Institutional protocols and algorithms that pertain to the evaluation of patients at risk for COVID-19 are in a state of rapid change based on information released by regulatory bodies including the CDC and federal and state organizations. These policies and algorithms were followed during the patient's care in the ED.     Pt presents with SOB.  Concern for fluid overload given her a little bit of edema in her legs patient reports decreased urination.  We will keep patient on BiPAP although she does not look too  uncomfortable.  Patient denies hitting her head to suggest intracranial hemorrhage   differential includes: PNA-will get xray to evaluation Anemia-CBC to evaluate ACS- will get trops Arrhythmia-Will get EKG and keep on monitor.  COVID- will get testing per algorithm. PE-lower suspicion given no risk factors and other cause more likely   After leaving the room patient had an episode of unresponsiveness witnessed by the nurse.  Eyes were deviated to the right.  When I entered the room patient was still spontaneously breathing but had eye deviation.  Patient was given 2 mg of Ativan.  IV deviation continued and patient was given 4 mg of Ativan.  Eye deviation broke but patient was not protecting her airway and on the BiPAP for the respiratory issues therefore patient will need to be intubated.  Chest x-ray shows ET tube in place.  Patient's white count is elevated so sepsis alert was called due to the hypotension.  Broad-spectrum antibiotics were started.  No significant edema noted on her chest x-ray.  Her BNP is slightly elevated we will trial 500 cc of fluid due to a little bit of hypotension.  Her lactate is 7.  Patient's urine looks consistent with UTI.  Unable to do CTs with contrast due to elevated kidney function.  Patient had a temp Foley placed that now shows that she is febrile.  I am now concerned about potential septic shock.  We will give some additional fluid while monitoring her breathing on the vent  9:54 PM CT scan concerning  for possible nec fasc versus Fournier's gangrene.  I did roll patient and she is got crepitus on her bottom.  Husband Denies any recent colonoscopy.  He reports that she stated that she felt like she had a boil in her bottom area for the past couple days and was putting boil cream on it.  Will call urology emergently-recommend talking to surgery   Discussed with surgery Dr. Christian Mate who will take patient to surgery.  Discussed with ICU team so patient will be  admitted to the ICU afterwards.  On my reevaluation her eyes are looking for any nothing she is continuing to seize I feel like getting her into surgery is more important than trying to transfer somewhere with has continuous EEG.  Suspect the seizure was secondary to her febrile and severe illness.  Patient was given 3.5 L of fluid for her ideal body weight.  I did not want to give additional fluid given she already has a lot of leg swelling.  After fluid resuscitation her blood pressure is reassuring.  Waiting to go to the OR               ____________________________________________   FINAL CLINICAL IMPRESSION(S) / ED DIAGNOSES   Final diagnoses:  SOB (shortness of breath)  Necrotizing fasciitis ([place])  Acute respiratory failure with hypoxia (HCC)  Sepsis, due to unspecified organism, unspecified whether acute organ dysfunction present [facility])     MEDICATIONS GIVEN DURING THIS VISIT:  Medications  midazolam (VERSED) 50 mg/50 mL (1 mg/mL) premix infusion (0 mg/hr Intravenous Hold 10/28/2020 2207)  fentaNYL 2560mg in NS 251m(1067mml) infusion-PREMIX (0 mcg/hr Intravenous Hold 10/11/2020 2208)  vancomycin (VANCOCIN) IVPB 1000 mg/200 mL premix (has no administration in time range)    Followed by  vancomycin (VANCOREADY) IVPB 1500 mg/300 mL (1,500 mg Intravenous New Bag/Given 10/13/2020 2250)  acetaminophen (OFIRMEV) IV 1,000 mg (1,000 mg Intravenous New Bag/Given 10/18/2020 2231)  docusate sodium (COLACE) capsule 100 mg (has no administration in time range)  polyethylene glycol (MIRALAX / GLYCOLAX) packet 17 g (has no administration in time range)  pantoprazole (PROTONIX) injection 40 mg (40 mg Intravenous Given 10/28/2020 2302)  0.9 %  sodium chloride infusion (has no administration in time range)  norepinephrine (LEVOPHED) '4mg'$  in 250m10memix infusion (5 mcg/min Intravenous New Bag/Given 10/25/2020 2308)  clindamycin (CLEOCIN) IVPB 900 mg (has no administration in time range)  ceFEPIme  (MAXIPIME) 2 g in sodium chloride 0.9 % 100 mL IVPB (has no administration in time range)  vancomycin (VANCOREADY) IVPB 1500 mg/300 mL (has no administration in time range)  naloxone ([facility] Center[facility]/2ML injection (2 mcg Intravenous Given 10/12/2020 1949)  LORazepam (ATIVAN) injection 2 mg (2 mg Intravenous Given 10/18/2020 1935)  LORazepam (ATIVAN) injection 4 mg (4 mg Intravenous Given 10/29/2020 1949)  etomidate (AMIDATE) injection (10 mg Intravenous Given 10/18/2020 1953)  rocuronium (ZEMURON) injection (100 mg Intravenous Given 10/19/2020 1953)  levETIRAcetam (KEPPRA) 2,000 mg in sodium chloride 0.9 % 250 mL IVPB (0 mg Intravenous Stopped 10/11/2020 2202)  ceFEPIme (MAXIPIME) 2 g in sodium chloride 0.9 % 100 mL IVPB (0 g Intravenous Stopped 10/06/2020 2146)  sodium chloride 0.9 % bolus 500 mL (500 mLs Intravenous New Bag/Given 10/22/2020 2200)  sodium chloride 0.9 % bolus 1,000 mL (1,000 mLs Intravenous New Bag/Given 10/03/2020 2201)  clindamycin (CLEOCIN) IVPB 900 mg (900 mg Intravenous New Bag/Given 10/17/2020 2245)  sodium chloride 0.9 % bolus 1,000 mL (1,000 mLs Intravenous New Bag/Given 09/30/2020 2217)  piperacillin-tazobactam (ZOSYN) IVPB 3.375 g (  0 g Intravenous Stopped 10/20/2020 2247)  lactated ringers bolus 1,000 mL (1,000 mLs Intravenous New Bag/Given 10/05/2020 2236)     ED Discharge Orders     None        Note:  This document was prepared using Dragon voice recognition software and may include unintentional dictation errors.   Vanessa [place], MD 10/04/2020 [phone]

## 2020-10-01 NOTE — ED Triage Notes (Signed)
Pt on cpap per ems, answering questions. Then had an episode of unresponsiveness

## 2020-10-01 NOTE — ED Notes (Signed)
Pt taken to ct and back tolerated well. bp low hanging a 554m bolus

## 2020-10-01 NOTE — H&P (Signed)
Patient ID: Teresa Holland, female   DOB: 1950-03-22, 70 y.o.   MRN: [id number]  Chief Complaint: Probable necrotizing fasciitis arising from perianal/perirectal abscess.  History of Present Illness Teresa Holland is a 70 y.o. female with a reported 2-day history from the patient's husband who was at bedside.  Apparently it was a firm tender area that she applied a potato skin poultice to.  They sought pharmacy consultation, were advised to utilize warm compresses.  She had some degree of altered level of consciousness. Complaints of shortness of breath, she had fallen, denying any head trauma.  In the ED she had witnessed unresponsiveness, and some reported seizure-like activity with some eye deviation.  The need for intubation was determined she was not protecting her airway and dependent on BiPAP. CT scan examination has revealed: The radiologist's printed report is still pending.  But on my evaluation there is clearly some perianal/perirectal gas in the subcutaneous tissues which extends along the perirectal planes of the right pelvis anteriorly.  She has a venous lactate of 7.0 A white blood cell count of 33.1 A procalcitonin of 34.88    Past Medical History Past Medical History:  Diagnosis Date   Anxiety    Arthritis    Asthma    Back pain    Breast cancer ([place]) 2016   right breast   Cancer ([place])    breast   CHF (congestive heart failure) ([place])    Claustrophobia    SEVERE   Complication of anesthesia    heart issues 2004 during gb surgery had to be revived   Constipation    COPD (chronic obstructive pulmonary disease) (HCC)    Diabetes mellitus without complication (HCC)    Duodenitis    Erosive gastritis    GERD (gastroesophageal reflux disease)    Gout    Headache    Hypertension    Hypothyroidism    Obesity    Pain    chronic back   Shortness of breath dyspnea    Sleep apnea    could not use cpap uses 1.5 l [place] at night      Past Surgical History:  Procedure  Laterality Date   abd pain     ABDOMINAL HYSTERECTOMY     ANKLE FRACTURE SURGERY     BACK SURGERY     lumbar fusion   BREAST BIOPSY Left 2004   negative   BREAST BIOPSY Right 2016   positive- invasive mammary carcinoma   BREAST LUMPECTOMY Right 2016   [facility]   CATARACT EXTRACTION W/PHACO Right 06/28/2017   Procedure: CATARACT EXTRACTION PHACO AND INTRAOCULAR LENS PLACEMENT ([place]);  Surgeon: Birder Robson, MD;  Location: [facility] ORS;  Service: Ophthalmology;  Laterality: Right;  [place] 02:20.6 AP% 20.6 CDE 28.90 Fluid Pack Lot # [id number]   CHOLECYSTECTOMY     COLONOSCOPY     COLONOSCOPY WITH PROPOFOL N/A 09/07/2017   Procedure: COLONOSCOPY WITH PROPOFOL;  Surgeon: Toledo, Benay Pike, MD;  Location: [facility] ENDOSCOPY;  Service: Gastroenterology;  Laterality: N/A;   ESOPHAGOGASTRODUODENOSCOPY (EGD) WITH PROPOFOL N/A 02/06/2016   Procedure: ESOPHAGOGASTRODUODENOSCOPY (EGD) WITH PROPOFOL;  Surgeon: Lollie Sails, MD;  Location: [facility] ENDOSCOPY;  Service: Endoscopy;  Laterality: N/A;   FRACTURE SURGERY Right    ankle   OOPHORECTOMY     one ovary remaining   PARTIAL MASTECTOMY WITH NEEDLE LOCALIZATION Right 09/20/2014   Procedure: PARTIAL MASTECTOMY WITH NEEDLE LOCALIZATION;  Surgeon: Leonie Green, MD;  Location: [facility] ORS;  Service: General;  Laterality: Right;  SENTINEL NODE BIOPSY Right 09/20/2014   Procedure: SENTINEL NODE BIOPSY;  Surgeon: Leonie Green, MD;  Location: [facility] ORS;  Service: General;  Laterality: Right;   TONSILLECTOMY      Allergies  Allergen Reactions   Aspirin Nausea Only and Other (See Comments)    Stomach burning   Furosemide Palpitations   Latex Itching, Rash and Other (See Comments)    SNEEZING   Meloxicam Nausea Only   Nsaids Nausea Only   Tape Rash    Current Facility-Administered Medications  Medication Dose Route Frequency Provider Last Rate Last Admin   [START ON 10/18/2020] acetaminophen (OFIRMEV) IV 1,000 mg  1,000 mg Intravenous Q6H Vanessa Tom Green, MD       clindamycin (CLEOCIN) IVPB 900 mg  900 mg Intravenous Once Vanessa Remington, MD       fentaNYL 2554mg in NS 2530m(1035mml) infusion-PREMIX  0-400 mcg/hr Intravenous Continuous FunVanessa DurhamD   Held at 10/28/2020 2208   midazolam (VERSED) 50 mg/50 mL (1 mg/mL) premix infusion  0.5-10 mg/hr Intravenous Continuous FunVanessa DurhamD   Held at 10/03/2020 2207   piperacillin-tazobactam (ZOSYN) IVPB 3.375 g  3.375 g Intravenous Once FunVanessa DurhamD       sodium chloride 0.9 % bolus 1,000 mL  1,000 mL Intravenous Once FunVanessa DurhamD       vancomycin (VANCOCIN) IVPB 1000 mg/200 mL premix  1,000 mg Intravenous Once DolDorothe PeaPH       Followed by   vancomycin (VANCOREADY) IVPB 1500 mg/300 mL  1,500 mg [facility]venous [facility]    Current Outpatient Medications  Medication Sig Dispense Refill   albuterol (VENTOLIN HFA) 108 (90 Base) MCG/ACT inhaler Inhale 2 puffs into the lungs every 6 (six) hours as needed for wheezing or shortness of breath.      calcium carbonate (CALCIUM 600) 600 MG TABS tablet Take 1 tablet (600 mg total) by mouth 2 (two) times daily with a meal. 60 tablet 6   Cholecalciferol (VITAMIN D3) 2000 UNITS capsule Take 2,000 Units by mouth daily.      dicyclomine (BENTYL) 10 MG capsule Take 10 mg by mouth 4 (four) times daily as needed for spasms.     Dulaglutide 0.75 MG/0.5ML SOPN Inject 1.5 mg into the skin once a week.     enalapril (VASOTEC) 20 MG tablet Take 20 mg by mouth every morning.      etodolac (LODINE) 400 MG tablet Take 400 mg by mouth 2 (two) times daily as needed.     fluticasone (FLOVENT HFA) 110 MCG/ACT inhaler Inhale into the lungs 2 (two) times daily.     gabapentin (NEURONTIN) 300 MG capsule Take 300 mg by mouth 3 (three) times daily as needed (for back pain).      guaiFENesin (MUCINEX) 600 MG 12 hr tablet Take 600 mg by mouth 2 (two) times daily as needed for cough or to loosen phlegm.      HYDROcodone-acetaminophen  (NORCO/VICODIN) 5-325 MG per tablet Take 1 tablet by mouth every 6 (six) hours as needed for severe pain.      levothyroxine (SYNTHROID, LEVOTHROID) 75 MCG tablet Take 75 mcg by mouth daily before breakfast.      linaclotide (LINZESS) 145 MCG CAPS capsule Take 145 mcg by mouth daily before breakfast.     loratadine (CLARITIN) 10 MG tablet Take 10 mg by mouth at bedtime.      magnesium oxide (  MAG-OX) 400 MG tablet TAKE ONE TABLET BY MOUTH TWICE DAILY     metoCLOPramide (REGLAN) 5 MG tablet Take 5 mg by mouth 3 (three) times daily before meals. 2-3 qd     metolazone (ZAROXOLYN) 2.5 MG tablet Take 2.5 mg by mouth every other day.     metoprolol (LOPRESSOR) 50 MG tablet Take 50 mg by mouth 2 (two) times daily.      omeprazole (PRILOSEC) 40 MG capsule Take 40 mg by mouth daily.     OVER THE COUNTER MEDICATION Take 1 tablet by mouth at bedtime. Alteril Natural Sleep Aid     polyethylene glycol (MIRALAX / GLYCOLAX) packet Take 17 g by mouth 4 (four) times daily as needed for moderate constipation.     potassium chloride (KLOR-CON) 10 MEQ tablet 10 mEq. PRN with Torsemide     simvastatin (ZOCOR) 80 MG tablet Take 80 mg by mouth daily.      torsemide (DEMADEX) 20 MG tablet Take 20 mg by mouth daily.     triamterene-hydrochlorothiazide (MAXZIDE) 75-50 MG tablet Take 1 tablet by mouth daily.     venlafaxine (EFFEXOR) 37.5 MG tablet Take 1 tablet by mouth once daily (Patient not taking: Reported on 10/17/2020) 90 tablet 1    Family History Family History  Problem Relation Age of Onset   Breast cancer Paternal Aunt        44's   Breast cancer Maternal Aunt 68   Breast cancer Maternal Aunt 67   Colon cancer Mother    Lung cancer Father    Prostate cancer Neg Hx    Kidney cancer Neg Hx    Bladder Cancer Neg Hx       Social History Social History   Tobacco Use   Smoking status: Former    Types: E-cigarettes   Smokeless tobacco: Never   Tobacco comments:    quit 06/14 uses e cigs occasionally   Vaping Use   Vaping Use: Some days  Substance Use Topics   Alcohol use: Not Currently   Drug use: No        Review of Systems  Unable to perform ROS: Critical illness    Physical Exam Blood pressure (!) 63/34, pulse (!) 113, temperature (!) 102.2 F (39 C), resp. rate 19, SpO2 96 %.   CONSTITUTIONAL: Morbidly obese African-American female currently intubated and sedated. EYES: Sclera non-icteric.   NECK: Trachea is midline, distension.  LYMPH NODES:  Lymph nodes in the neck are not appreciable.. RESPIRATORY: Mechanically ventilated with equal breath sounds bilaterally CARDIOVASCULAR: Heart is regular in rhythm. GI: The abdomen is obese, soft, nontender, and nondistended. There were no palpable masses. I did not appreciate hepatosplenomegaly. There were normal bowel sounds.  There is a midline infraumbilical scar. GU: There is a raised 2 cm diameter, draining site along the medial right buttock in the perianal region.  There is marked induration surrounding it and extending cephalad on the buttock and caudad along the perineum.  There is no gross skin discoloration.  It is difficult to appreciate crepitus.  There is fairly well-defined induration along the right perineum and asymmetrical swelling of the vulva. MUSCULOSKELETAL: No gross deformity or open wounds. SKIN: Focused exam on the GU region.   NEUROLOGIC: Unable to be assessed due to intubated status. PSYCH: Unable to assess at this time.  Data Reviewed I have personally reviewed what is currently available of the patient's imaging, recent labs and medical records.   Labs:  CBC Latest Ref Rng &  Units 10/24/2020 09/25/2020 05/06/2020  WBC 4.0 - 10.5 K/uL 33.1(H) 13.8(H) 17.6(H)  Hemoglobin 12.0 - 15.0 g/dL 11.9(L) 12.2 13.1  Hematocrit 36.0 - 46.0 % 35.8(L) 39.8 42.2  Platelets 150 - 400 K/uL 329 274 315   CMP Latest Ref Rng & Units 10/20/2020 09/25/2020 05/06/2020  Glucose 70 - 99 mg/dL 263(H) 150(H) 103(H)  BUN 8 - 23 mg/dL  34(H) 14 28(H)  Creatinine 0.44 - 1.00 mg/dL 2.93(H) 1.19(H) 1.25(H)  Sodium 135 - 145 mmol/L 128(L) 143 139  Potassium 3.5 - 5.1 mmol/L 4.8 3.9 3.6  Chloride 98 - 111 mmol/L 90(L) 101 96(L)  CO2 22 - 32 mmol/L 19(L) 29 30  Calcium 8.9 - 10.3 mg/dL 8.7(L) 9.8 9.4  Total Protein 6.5 - 8.1 g/dL 7.0 - 8.2(H)  Total Bilirubin 0.3 - 1.2 mg/dL 2.6(H) - 0.8  Alkaline Phos 38 - 126 U/L 86 - 51  AST 15 - 41 U/L 92(H) - 16  ALT 0 - 44 U/L 47(H) - 14      Imaging: Radiology review:  Within last 24 hrs: DG Chest Portable 1 View  Result Date: 10/20/2020 CLINICAL DATA:  Intubated EXAM: PORTABLE CHEST 1 VIEW COMPARISON:  10/06/2020 FINDINGS: Single frontal view of the chest demonstrates endotracheal tube overlying tracheal air column, tip midway between thoracic inlet and carina. Enteric catheter passes below diaphragm, tip and side port projecting over gastric body. Cardiac silhouette is enlarged but stable. Continued central vascular congestion, with decreased lung volumes and scattered hypoventilatory changes at the lung bases. No effusion or pneumothorax. IMPRESSION: 1. No complication after intubation. 2. Low lung volumes. 3. Stable vascular congestion. Electronically Signed   By: Randa Ngo M.D.   On: 10/15/2020 20:53   DG Chest Portable 1 View  Result Date: 09/30/2020 CLINICAL DATA:  Short of breath, lower extremity edema EXAM: PORTABLE CHEST 1 VIEW COMPARISON:  09/25/2020 FINDINGS: Single frontal view of the chest demonstrates persistent enlargement the cardiac silhouette. There is increased central vascular congestion, without airspace disease, effusion, or pneumothorax. No acute bony abnormalities. IMPRESSION: 1. Increased vascular congestion without overt edema. Electronically Signed   By: Randa Ngo M.D.   On: 09/30/2020 19:54   DG Abd Portable 1 View  Result Date: 10/21/2020 CLINICAL DATA:  Nasogastric tube placement EXAM: PORTABLE ABDOMEN - 1 VIEW COMPARISON:  None. FINDINGS:  Nasogastric tube tip overlies the proximal body of the stomach. The visualized abdominal gas pattern is unremarkable. Pelvis excluded from view. IMPRESSION: Orogastric tube tip overlying the proximal body of the stomach. Electronically Signed   By: Fidela Salisbury MD   On: 10/04/2020 20:50                Assessment Sepsis/septic shock secondary to likely necrotizing infection involving the right buttock perirectal perineum, extending with gas along the right lateral pelvic sidewall anteriorly. Patient Active Problem List   Diagnosis Date Noted   Constipation due to opioid therapy 02/23/2018   DM type 2 with diabetic mixed hyperlipidemia ([place]) 02/23/2018   History of breast cancer 05/02/2017   Tubular adenoma 12/07/2016   Morbid (severe) obesity due to excess calories ([place]) 05/29/2015   Controlled type 2 diabetes mellitus without complication ([place]) [id number]   Chronic diastolic heart failure ([place]) 01/10/2015   Cancer of lower-outer quadrant of female breast ([place]) 10/03/2014   Obstructive apnea 10/03/2014   Malignant neoplasm of lower-outer quadrant of female breast ([place]) 10/03/2014   Cancer of right breast ([place]) 09/15/2014   Acquired hypothyroidism 02/20/2014   CAFL (  chronic airflow limitation) ([place]) 07/12/2013   Diabetes ([place]) 07/12/2013   Gout 07/12/2013   BP (high blood pressure) 07/12/2013   Disc disorder 07/12/2013   Arthritis, degenerative 07/12/2013   Chronic obstructive pulmonary disease ([place]) 07/12/2013   Diabetes mellitus ([place]) 07/12/2013    Plan    Patient has already received antibiotics of Zosyn, clindamycin and vancomycin.  She is currently on vasopressor support with norepinephrine as well as ventilator support. I reviewed the CT scans and discussed the patient's current condition and the severity of this infection.  I have also discussed with the husband the potential extent of the excisional debridement necessary, the likely need for additional procedures and  the extreme morbidity of wound care, potential diverting colostomy etc. I have tried to indicate to him the seriousness of this patient's condition and I believe he understands that this is indeed life-threatening.  Face-to-face time spent with the patient and accompanying care providers(if present) was 60 minutes, with more than 50% of the time spent counseling, educating, and coordinating care of the patient.    These notes generated with voice recognition software. I apologize for typographical errors.  Ronny Bacon M.D., FACS 10/18/2020, 10:08 PM

## 2020-10-01 NOTE — Progress Notes (Signed)
Pharmacy Antibiotic Note  Teresa Holland is a 70 y.o. female admitted on 10/18/2020 with  necrotizing fasciitis .  Pharmacy has been consulted for Cefepime, Vancomycin dosing.  Plan: Vancomycin 2500 mg IV X 1 given in ED on 8/3 @ ~ 2300.  Vancomycin 1500 mg IV Q48H ordered to start on 8/5 @ 2300.   AUC = 473.8 Vanc trough = 12.2   Cefepime 2 gm IV X 1 given in ED on 8/3 @ 2100. Cefepime 2 gm IV Q8H ordered to continue on 8/4 @ 0500.      Temp (24hrs), Avg:102 F (38.9 C), Min:101.6 F (38.7 C), Max:102.6 F (39.2 C)  Recent Labs  Lab 09/25/20 1555 10/29/2020 1925 10/04/2020 2038  WBC 13.8* 33.1*  --   CREATININE 1.19* 2.93*  --   LATICACIDVEN  --   --  7.0*    Estimated Creatinine Clearance: 29.7 mL/min (A) (by C-G formula based on SCr of 2.93 mg/dL (H)).    Allergies  Allergen Reactions   Aspirin Nausea Only and Other (See Comments)    Stomach burning   Furosemide Palpitations   Latex Itching, Rash and Other (See Comments)    SNEEZING   Meloxicam Nausea Only   Nsaids Nausea Only   Tape Rash    Antimicrobials this admission:   >>    >>   Dose adjustments this admission:   Microbiology results:  BCx:   UCx:    Sputum:    MRSA PCR:   Thank you for allowing pharmacy to be a part of this patient's care.  Omni Dunsworth D 10/15/2020 10:47 PM

## 2020-10-01 NOTE — Consult Note (Signed)
PHARMACY -  BRIEF ANTIBIOTIC NOTE   Pharmacy has received consult(s) for Vancomycin and cefepime from an ED provider.  The patient's profile has been reviewed for ht/wt/allergies/indication/available labs.    One time order(s) placed for  Vancomycin 2500 mg Cefepime 2 gram  Further antibiotics/pharmacy consults should be ordered by admitting physician if indicated.                       Thank you, Dorothe Pea, PharmD, BCPS Clinical Pharmacist   10/17/2020  8:19 PM

## 2020-10-02 ENCOUNTER — Encounter: Payer: Self-pay | Admitting: Surgery

## 2020-10-02 ENCOUNTER — Inpatient Hospital Stay: Payer: Medicare HMO

## 2020-10-02 DIAGNOSIS — R6521 Severe sepsis with septic shock: Secondary | ICD-10-CM

## 2020-10-02 DIAGNOSIS — A419 Sepsis, unspecified organism: Secondary | ICD-10-CM | POA: Diagnosis not present

## 2020-10-02 DIAGNOSIS — Z515 Encounter for palliative care: Secondary | ICD-10-CM

## 2020-10-02 DIAGNOSIS — Z7189 Other specified counseling: Secondary | ICD-10-CM

## 2020-10-02 DIAGNOSIS — J984 Other disorders of lung: Secondary | ICD-10-CM | POA: Diagnosis not present

## 2020-10-02 DIAGNOSIS — Z4682 Encounter for fitting and adjustment of non-vascular catheter: Secondary | ICD-10-CM | POA: Diagnosis not present

## 2020-10-02 DIAGNOSIS — I700 Atherosclerosis of aorta: Secondary | ICD-10-CM | POA: Diagnosis not present

## 2020-10-02 DIAGNOSIS — Z452 Encounter for adjustment and management of vascular access device: Secondary | ICD-10-CM | POA: Diagnosis not present

## 2020-10-02 LAB — BLOOD GAS, ARTERIAL
Acid-base deficit: 4.9 mmol/L — ABNORMAL HIGH (ref 0.0–2.0)
Acid-base deficit: 8.1 mmol/L — ABNORMAL HIGH (ref 0.0–2.0)
Acid-base deficit: 6.5 mmol/L — ABNORMAL HIGH (ref 0.0–2.0)
Bicarbonate: 22 mmol/L (ref 20.0–28.0)
Bicarbonate: 17.8 mmol/L — ABNORMAL LOW (ref 20.0–28.0)
Bicarbonate: 19.7 mmol/L — ABNORMAL LOW (ref 20.0–28.0)
FIO2: 0.8
FIO2: 0.8
FIO2: 0.8
MECHVT: 480 mL
MECHVT: 480 mL
Mechanical Rate: 18
Mechanical Rate: 16
O2 Saturation: 97.2 %
O2 Saturation: 98.8 %
O2 Saturation: 98.7 %
PEEP: 8 cmH2O
PEEP: 8 cmH2O
Patient temperature: 37
Patient temperature: 37
Patient temperature: 37
pCO2 arterial: 48 mmHg (ref 32.0–48.0)
pCO2 arterial: 37 mmHg (ref 32.0–48.0)
pCO2 arterial: 41 mmHg (ref 32.0–48.0)
pH, Arterial: 7.27 — ABNORMAL LOW (ref 7.350–7.450)
pH, Arterial: 7.29 — ABNORMAL LOW (ref 7.350–7.450)
pH, Arterial: 7.29 — ABNORMAL LOW (ref 7.350–7.450)
pO2, Arterial: 105 mmHg (ref 83.0–108.0)
pO2, Arterial: 136 mmHg — ABNORMAL HIGH (ref 83.0–108.0)
pO2, Arterial: 132 mmHg — ABNORMAL HIGH (ref 83.0–108.0)

## 2020-10-02 LAB — CBC
HCT: 32.9 % — ABNORMAL LOW (ref 36.0–46.0)
Hemoglobin: 10.2 g/dL — ABNORMAL LOW (ref 12.0–15.0)
MCH: 26.3 pg (ref 26.0–34.0)
MCHC: 31 g/dL (ref 30.0–36.0)
MCV: 84.8 fL (ref 80.0–100.0)
Platelets: 307 10*3/uL (ref 150–400)
RBC: 3.88 MIL/uL (ref 3.87–5.11)
RDW: 14.3 % (ref 11.5–15.5)
WBC: 30.2 10*3/uL — ABNORMAL HIGH (ref 4.0–10.5)
nRBC: 0.1 % (ref 0.0–0.2)

## 2020-10-02 LAB — HEPATIC FUNCTION PANEL
ALT: 33 U/L (ref 0–44)
AST: 60 U/L — ABNORMAL HIGH (ref 15–41)
Albumin: 2 g/dL — ABNORMAL LOW (ref 3.5–5.0)
Alkaline Phosphatase: 71 U/L (ref 38–126)
Bilirubin, Direct: 0.9 mg/dL — ABNORMAL HIGH (ref 0.0–0.2)
Indirect Bilirubin: 1.1 mg/dL — ABNORMAL HIGH (ref 0.3–0.9)
Total Bilirubin: 2 mg/dL — ABNORMAL HIGH (ref 0.3–1.2)
Total Protein: 5.4 g/dL — ABNORMAL LOW (ref 6.5–8.1)

## 2020-10-02 LAB — BASIC METABOLIC PANEL
Anion gap: 12 (ref 5–15)
Anion gap: 13 (ref 5–15)
Anion gap: 12 (ref 5–15)
BUN: 36 mg/dL — ABNORMAL HIGH (ref 8–23)
BUN: 38 mg/dL — ABNORMAL HIGH (ref 8–23)
BUN: 37 mg/dL — ABNORMAL HIGH (ref 8–23)
CO2: 20 mmol/L — ABNORMAL LOW (ref 22–32)
CO2: 22 mmol/L (ref 22–32)
CO2: 24 mmol/L (ref 22–32)
Calcium: 7.4 mg/dL — ABNORMAL LOW (ref 8.9–10.3)
Calcium: 7.6 mg/dL — ABNORMAL LOW (ref 8.9–10.3)
Calcium: 7.2 mg/dL — ABNORMAL LOW (ref 8.9–10.3)
Chloride: 96 mmol/L — ABNORMAL LOW (ref 98–111)
Chloride: 96 mmol/L — ABNORMAL LOW (ref 98–111)
Chloride: 96 mmol/L — ABNORMAL LOW (ref 98–111)
Creatinine, Ser: 2.95 mg/dL — ABNORMAL HIGH (ref 0.44–1.00)
Creatinine, Ser: 2.97 mg/dL — ABNORMAL HIGH (ref 0.44–1.00)
Creatinine, Ser: 2.62 mg/dL — ABNORMAL HIGH (ref 0.44–1.00)
GFR, Estimated: 17 mL/min — ABNORMAL LOW (ref >60)
GFR, Estimated: 17 mL/min — ABNORMAL LOW (ref >60)
GFR, Estimated: 19 mL/min — ABNORMAL LOW (ref >60)
Glucose, Bld: 235 mg/dL — ABNORMAL HIGH (ref 70–99)
Glucose, Bld: 293 mg/dL — ABNORMAL HIGH (ref 70–99)
Glucose, Bld: 320 mg/dL — ABNORMAL HIGH (ref 70–99)
Potassium: 4.6 mmol/L (ref 3.5–5.1)
Potassium: 4.2 mmol/L (ref 3.5–5.1)
Potassium: 4.8 mmol/L (ref 3.5–5.1)
Sodium: 128 mmol/L — ABNORMAL LOW (ref 135–145)
Sodium: 131 mmol/L — ABNORMAL LOW (ref 135–145)
Sodium: 132 mmol/L — ABNORMAL LOW (ref 135–145)

## 2020-10-02 LAB — STREP PNEUMONIAE URINARY ANTIGEN: Strep Pneumo Urinary Antigen: NEGATIVE

## 2020-10-02 LAB — TYPE AND SCREEN
ABO/RH(D): B POS
Antibody Screen: NEGATIVE

## 2020-10-02 LAB — GLUCOSE, CAPILLARY
Glucose-Capillary: 211 mg/dL — ABNORMAL HIGH (ref 70–99)
Glucose-Capillary: 231 mg/dL — ABNORMAL HIGH (ref 70–99)

## 2020-10-02 LAB — BETA-HYDROXYBUTYRIC ACID
Beta-Hydroxybutyric Acid: 0.6 mmol/L — ABNORMAL HIGH (ref 0.05–0.27)
Beta-Hydroxybutyric Acid: 1.58 mmol/L — ABNORMAL HIGH (ref 0.05–0.27)

## 2020-10-02 LAB — MAGNESIUM: Magnesium: 1.2 mg/dL — ABNORMAL LOW (ref 1.7–2.4)

## 2020-10-02 LAB — HIV ANTIBODY (ROUTINE TESTING W REFLEX): HIV Screen 4th Generation wRfx: NONREACTIVE

## 2020-10-02 LAB — VANCOMYCIN, RANDOM: Vancomycin Rm: 14

## 2020-10-02 LAB — LACTIC ACID, PLASMA
Lactic Acid, Venous: 2.8 mmol/L (ref 0.5–1.9)
Lactic Acid, Venous: 3.9 mmol/L (ref 0.5–1.9)

## 2020-10-02 LAB — PROCALCITONIN: Procalcitonin: 40.85 ng/mL

## 2020-10-02 LAB — TROPONIN I (HIGH SENSITIVITY): Troponin I (High Sensitivity): 664 ng/L (ref <18)

## 2020-10-02 LAB — MRSA NEXT GEN BY PCR, NASAL: MRSA by PCR Next Gen: DETECTED — AB

## 2020-10-02 LAB — PHOSPHORUS: Phosphorus: 5.6 mg/dL — ABNORMAL HIGH (ref 2.5–4.6)

## 2020-10-02 LAB — HEMOGLOBIN A1C
Hgb A1c MFr Bld: 7.1 % — ABNORMAL HIGH (ref 4.8–5.6)
Mean Plasma Glucose: 157.07 mg/dL

## 2020-10-02 LAB — C-REACTIVE PROTEIN: CRP: 55 mg/dL — ABNORMAL HIGH (ref <1.0)

## 2020-10-02 MED ORDER — MIDAZOLAM HCL 5 MG/5ML IJ SOLN
INTRAMUSCULAR | Status: DC | PRN
  Administered 2020-10-02: 2 mg via INTRAVENOUS

## 2020-10-02 MED ORDER — NOREPINEPHRINE 16 MG/250ML-% IV SOLN
5.0000 ug/min | INTRAVENOUS | Status: DC
  Administered 2020-10-02: 32 ug/min via INTRAVENOUS
  Filled 2020-10-02: qty 250

## 2020-10-02 MED ORDER — EPINEPHRINE PF 1 MG/ML IJ SOLN
INTRAMUSCULAR | Status: AC
  Filled 2020-10-02: qty 4

## 2020-10-02 MED ORDER — SODIUM CHLORIDE 0.9 % IR SOLN
Status: DC | PRN
  Administered 2020-10-02: 500 mL

## 2020-10-02 MED ORDER — VASOPRESSIN 20 UNITS/100 ML INFUSION FOR SHOCK
0.0000 [IU]/min | INTRAVENOUS | Status: DC
  Administered 2020-10-02: 0.03 [IU]/min via INTRAVENOUS
  Filled 2020-10-02 (×2): qty 100

## 2020-10-02 MED ORDER — MAGNESIUM SULFATE 2 GM/50ML IV SOLN
2.0000 g | Freq: Once | INTRAVENOUS | Status: AC
  Administered 2020-10-02: 2 g via INTRAVENOUS
  Filled 2020-10-02: qty 50

## 2020-10-02 MED ORDER — AMIODARONE IV BOLUS ONLY 150 MG/100ML
INTRAVENOUS | Status: AC
  Filled 2020-10-02: qty 100

## 2020-10-02 MED ORDER — PHENYLEPHRINE CONCENTRATED 100MG/250ML (0.4 MG/ML) INFUSION SIMPLE
0.0000 ug/min | INTRAVENOUS | Status: DC
  Administered 2020-10-02: 20 ug/min via INTRAVENOUS
  Filled 2020-10-02: qty 250

## 2020-10-02 MED ORDER — SODIUM CHLORIDE 0.9 % IV SOLN
INTRAVENOUS | Status: DC | PRN
  Administered 2020-10-02: .05 ug/kg/min via INTRAVENOUS

## 2020-10-02 MED ORDER — SODIUM BICARBONATE 8.4 % IV SOLN
INTRAVENOUS | Status: DC | PRN
  Administered 2020-10-02 (×2): 12.5 meq via INTRAVENOUS

## 2020-10-02 MED ORDER — HYDROCORTISONE NA SUCCINATE PF 100 MG IJ SOLR
100.0000 mg | Freq: Two times a day (BID) | INTRAMUSCULAR | Status: DC
  Administered 2020-10-02: 100 mg via INTRAVENOUS
  Filled 2020-10-02: qty 2

## 2020-10-02 MED ORDER — VANCOMYCIN HCL 1500 MG/300ML IV SOLN
1500.0000 mg | Freq: Once | INTRAVENOUS | Status: DC
  Filled 2020-10-02: qty 300

## 2020-10-02 MED ORDER — HEPARIN SODIUM (PORCINE) 5000 UNIT/ML IJ SOLN: 5000.0000 [IU] | Freq: Three times a day (TID) | INTRAMUSCULAR | Status: DC

## 2020-10-02 MED ORDER — EPINEPHRINE 1 MG/10ML IJ SOSY
PREFILLED_SYRINGE | INTRAMUSCULAR | Status: DC | PRN
  Administered 2020-10-02: 25 ug via INTRAVENOUS
  Administered 2020-10-02: 40 ug via INTRAVENOUS
  Administered 2020-10-02 (×2): 10 ug via INTRAVENOUS
  Administered 2020-10-02: 20 ug via INTRAVENOUS
  Administered 2020-10-02: 25 ug via INTRAVENOUS
  Administered 2020-10-02: 10 ug via INTRAVENOUS

## 2020-10-02 MED ORDER — SODIUM BICARBONATE 8.4 % IV SOLN
INTRAVENOUS | Status: AC
  Filled 2020-10-02: qty 100

## 2020-10-02 MED ORDER — MIDAZOLAM 50MG/50ML (1MG/ML) PREMIX INFUSION
0.0000 mg/h | INTRAVENOUS | Status: DC
  Administered 2020-10-02: 2 mg/h via INTRAVENOUS
  Filled 2020-10-02: qty 50

## 2020-10-02 MED ORDER — FENTANYL 2500MCG IN NS 250ML (10MCG/ML) PREMIX INFUSION
25.0000 ug/h | INTRAVENOUS | Status: DC
  Administered 2020-10-02: 25 ug/h via INTRAVENOUS
  Filled 2020-10-02 (×2): qty 250

## 2020-10-02 MED ORDER — NOREPINEPHRINE 4 MG/250ML-% IV SOLN
INTRAVENOUS | Status: AC
  Filled 2020-10-02: qty 250

## 2020-10-02 MED ORDER — INSULIN ASPART 100 UNIT/ML IJ SOLN
0.0000 [IU] | INTRAMUSCULAR | Status: DC
  Administered 2020-10-02: 3 [IU] via SUBCUTANEOUS
  Filled 2020-10-02: qty 1

## 2020-10-02 MED ORDER — PHENYLEPHRINE HCL (PRESSORS) 10 MG/ML IV SOLN
INTRAVENOUS | Status: DC | PRN
  Administered 2020-10-02: 100 ug via INTRAVENOUS

## 2020-10-02 MED ORDER — LACTATED RINGERS IV SOLN: INTRAVENOUS | Status: DC | PRN

## 2020-10-02 MED ORDER — POLYETHYLENE GLYCOL 3350 17 G PO PACK: 17.0000 g | PACK | Freq: Every day | ORAL | Status: DC

## 2020-10-02 MED ORDER — AMIODARONE HCL IN DEXTROSE 360-4.14 MG/200ML-% IV SOLN
INTRAVENOUS | Status: AC
  Filled 2020-10-02: qty 200

## 2020-10-02 MED ORDER — SODIUM CHLORIDE 0.9 % IV SOLN
2.0000 g | Freq: Two times a day (BID) | INTRAVENOUS | Status: DC
  Filled 2020-10-02 (×2): qty 2

## 2020-10-02 MED ORDER — DOCUSATE SODIUM 50 MG/5ML PO LIQD: 100.0000 mg | Freq: Two times a day (BID) | ORAL | Status: DC

## 2020-10-02 MED ORDER — MIDAZOLAM HCL 2 MG/2ML IJ SOLN
INTRAMUSCULAR | Status: AC
  Filled 2020-10-02: qty 2

## 2020-10-02 MED ORDER — FENTANYL BOLUS VIA INFUSION
25.0000 ug | INTRAVENOUS | Status: DC | PRN
  Administered 2020-10-02: 75 ug via INTRAVENOUS
  Administered 2020-10-02 (×2): 25 ug via INTRAVENOUS
  Filled 2020-10-02: qty 100

## 2020-10-02 MED ORDER — LACTATED RINGERS IV BOLUS
1000.0000 mL | Freq: Once | INTRAVENOUS | Status: AC
  Administered 2020-10-02: 1000 mL via INTRAVENOUS

## 2020-10-02 MED ORDER — VANCOMYCIN VARIABLE DOSE PER UNSTABLE RENAL FUNCTION (PHARMACIST DOSING): Status: DC

## 2020-10-02 MED ORDER — SODIUM BICARBONATE 8.4 % IV SOLN
100.0000 meq | Freq: Once | INTRAVENOUS | Status: AC
  Administered 2020-10-02: 100 meq via INTRAVENOUS

## 2020-10-02 MED ORDER — SODIUM CHLORIDE 0.9 % IR SOLN
Status: DC | PRN
  Administered 2020-10-02: 3000 mL

## 2020-10-02 MED ORDER — MIDAZOLAM BOLUS VIA INFUSION
0.0000 mg | INTRAVENOUS | Status: DC | PRN
  Administered 2020-10-02: 1 mg via INTRAVENOUS
  Filled 2020-10-02: qty 5

## 2020-10-03 LAB — LEGIONELLA PNEUMOPHILA SEROGP 1 UR AG: L. pneumophila Serogp 1 Ur Ag: NEGATIVE

## 2020-10-03 SURGERY — INCISION AND DRAINAGE, ABSCESS, PERIRECTAL
Anesthesia: General | Laterality: Right

## 2020-10-04 LAB — AEROBIC/ANAEROBIC CULTURE W GRAM STAIN (SURGICAL/DEEP WOUND): Gram Stain: NONE SEEN

## 2020-10-04 LAB — URINE CULTURE: Culture: 70000 — AB

## 2020-10-05 LAB — CULTURE, BLOOD (ROUTINE X 2): Special Requests: ADEQUATE

## 2020-10-07 LAB — CULTURE, BLOOD (ROUTINE X 2): Culture: NO GROWTH

## 2020-10-30 NOTE — Anesthesia Procedure Notes (Signed)
Arterial Line Insertion Start/End08/01/2021 12:02 AM, 10-Oct-2020 12:04 AM Performed by: [name], MD, anesthesiologist  Patient location: ICU. Emergency situation Patient sedated Left, radial was placed Catheter size: 20 G Hand hygiene performed  and maximum sterile barriers used   Attempts: 1 Procedure performed using ultrasound guided technique. Ultrasound Notes:anatomy identified, needle tip was noted to be adjacent to the nerve/plexus identified and no ultrasound evidence of intravascular and/or intraneural injection Following insertion, dressing applied. Post procedure assessment: normal and unchanged  Patient tolerated the procedure well with no immediate complications.

## 2020-10-30 NOTE — Transfer of Care (Signed)
Immediate Anesthesia Transfer of Care Note  Patient: Teresa Holland  Procedure(s) Performed: Excisional.IRRIGATION AND DEBRIDEMENT  RIGHT  BUTTOCK, PERINEUM,PERIANAL AND GROIN REGIONS (Groin)  Patient Location: PACU and ICU  Anesthesia Type:General  Level of Consciousness: Patient remains intubated per anesthesia plan  Airway & Oxygen Therapy: Patient placed on Ventilator (see vital sign flow sheet for setting)  Post-op Assessment: Report given to RN and Post -op Vital signs reviewed and stable  Post vital signs: Reviewed and stable  Last Vitals:  Vitals Value Taken Time  BP    Temp    Pulse 121 10/21/20 0154  Resp 22 October 21, 2020 0153  SpO2 100 % 10-21-20 0154  Vitals shown include unvalidated device data.  Last Pain: There were no vitals filed for this visit.       Complications: No notable events documented.

## 2020-10-30 NOTE — Progress Notes (Signed)
Pt turned to change pads under her as well as clean stool near surgical site. Pt went into A-fib RVR rate 150s-170s. BP dropped to MAP of 30s art line and cuff correlating. Pt stiff and appeared to be waking up. PRN versed and fentanyl bolused. MD called and came to bedside. Amiodarone pulled from pyxis override for emergent situation. Family at bedside with palliative care and MD. Pt made comfort care. Pressors turned off and Fentanyl and versed remain at this time for comfort.

## 2020-10-30 NOTE — Progress Notes (Addendum)
[place] Hospital Day(s): 1.   Post op day(s): 1 Day Post-Op.   Interval History:  Patient seen and examined; same morning as surgery She continues 50 mcg/min Norepinephrine, 42 mcg phenylephrine, 0.03 units vasopressin  Leukocytosis down to 30.2K (from 33.1K) Renal function remains elevated; sCr - 2.62; UO - unmeasured Acidotic; pending repeat ABG Cx from OR pending She is on Cefepime, Clindamycin, and Vancomycin  Plan for return to OR tomorrow   Vital signs in last 24 hours: [min-max] current  Temp:  [98.06 F (36.7 C)-102.6 F (39.2 C)] 99.14 F (37.3 C) (08/04 0600) Pulse Rate:  [43-120] 108 (08/04 0600) Resp:  [14-78] 25 (08/04 0600) BP: (63-153)/(24-127) 97/71 (08/04 0600) SpO2:  [96 %-100 %] 100 % (08/04 0600) Arterial Line BP: (63-102)/(38-52) 63/38 (08/04 0600) FiO2 (%):  [80 %-100 %] 80 % (08/04 0151) Weight:  [170.4 kg] 170.4 kg (08/04 0500)       Weight: (!) 170.4 kg BMI (Calculated): 57.13   Intake/Output last 2 shifts:  08/03 0701 - 08/04 0700 In: 1064.6 [I.V.:846.8; IV Piggyback:217.9] Out: 300 [Blood:300]   Physical Exam:  Constitutional: Intubated; sedated Respiratory: Intubated Cardiovascular: tachycardic; regular Gastrointestinal: soft; unable to reliable assess tenderness Integumentary: Right perineal/perirectal I&D; saturated in stool  Labs:  CBC Latest Ref Rng & Units 2020/10/27 10/11/2020 09/25/2020  WBC 4.0 - 10.5 K/uL 30.2(H) 33.1(H) 13.8(H)  Hemoglobin 12.0 - 15.0 g/dL 10.2(L) 11.9(L) 12.2  Hematocrit 36.0 - 46.0 % 32.9(L) 35.8(L) 39.8  Platelets 150 - 400 K/uL 307 329 274   CMP Latest Ref Rng & Units 27-Oct-2020 Oct 27, 2020 10-27-2020  Glucose 70 - 99 mg/dL 320(H) 293(H) 235(H)  BUN 8 - 23 mg/dL 37(H) 38(H) 36(H)  Creatinine 0.44 - 1.00 mg/dL 2.62(H) 2.97(H) 2.95(H)  Sodium 135 - 145 mmol/L 132(L) 131(L) 128(L)  Potassium 3.5 - 5.1 mmol/L 4.8 4.2 4.6  Chloride 98 - 111 mmol/L 96(L) 96(L) 96(L)  CO2 22 -  32 mmol/L 24 22 20(L)  Calcium 8.9 - 10.3 mg/dL 7.2(L) 7.6(L) 7.4(L)  Total Protein 6.5 - 8.1 g/dL - - -  Total Bilirubin 0.3 - 1.2 mg/dL - - -  Alkaline Phos 38 - 126 U/L - - -  AST 15 - 41 U/L - - -  ALT 0 - 44 U/L - - -     Imaging studies: No new pertinent imaging studies   Assessment/Plan:  70 y.o. critically ill female requiring vasopressor and ventilator support 1 Day Post-Op s/p incision and debridement of right supralevator/pelvic perirectal abscess/necrotizing fasciitis   - Appreciate PCCM assistance with ventilator and vasopressor management - She will need takeback; likely tomorrow (08/05), with Dr Christian Mate pending OR/Anesthesia availability and patient condition   - Continue broad spectrum Abx (Cefepime, Clindamycin, and Vancomycin); follow up Cx from OR  - Further management per primary service; we will of course follow   All of the above findings and recommendations were discussed with the medical team.   -- Edison Simon, PA-C [facility] 10-27-2020, 7:17 AM [phone] M-F: 7am - 4pm

## 2020-10-30 NOTE — Anesthesia Postprocedure Evaluation (Signed)
Anesthesia Post Note  Patient: Teresa Holland  Procedure(s) Performed: Excisional.IRRIGATION AND DEBRIDEMENT  RIGHT  BUTTOCK, PERINEUM,PERIANAL AND GROIN REGIONS (Groin)  Patient location during evaluation: ICU Anesthesia Type: General Level of consciousness: sedated Pain management: pain level controlled Vital Signs Assessment: vitals unstable Respiratory status: patient on ventilator - see flowsheet for VS Cardiovascular status: tachycardic and unstable (on Norepinephrine gtt and vasopressin gtt) Anesthetic complications: no   No notable events documented.   Last Vitals:  Vitals:   2020/10/03 0400 10/03/20 0500  BP: 119/82 112/84  Pulse: (!) 110 (!) 104  Resp: (!) 26 (!) 28  Temp: 36.8 C 37 C  SpO2: 100% 100%    Last Pain:  Vitals:   Oct 03, 2020 0500  TempSrc: Bladder  PainSc:                  [name]

## 2020-10-30 NOTE — H&P (Addendum)
NAME:  Teresa Holland, MRN:  [id number], DOB:  Feb 08, 1951, LOS: 0 ADMISSION DATE:  10/19/2020, CONSULTATION DATE:  10/15/2020 REFERRING MD: Marjean Donna MD CHIEF COMPLAINT: SOB    HPI  70 y.o with significant PMH as below who presented to the ED with chief complaints of progressive shortness of breath.  Patient is currently intubated, mechanically ventilated and sedated so history obtained from ED notes. Per ED notes, patient apparently had a fall but denied hitting her head. She was brought in the ED with c/o of progressive shortness of breath and altered mental status. Per husband, patient felt like she had a boil in her bottom area for the past 2 days and was applying a OTC cream on it.  ED Course: On arrival to the ED, she was febrile (!) 102.2 F (39 C),with blood pressure (!) 63/32m Hg and pulse rate (!) 113,beats/min, RR 26. There were no focal neurological deficits; she was alert and oriented x self. Patient was placed on BiPAP for concerns of volume overload. Shortly afterwards she became unresponsive  and was noted with eyes deviating to the right but still breathing spontaneously. Patient received '2mg'$  of IV Ativan for suspected seizure activity. She apparently continue with the eye deviation requiring a second dose of 2 mg IV Ativan which broke the eye deviation back to midline. Unfortunately she was not able to protect her airway and was subsequently intubated for airway protection. Initial labs revealed: Labs/Diagnostics WBC/Hgb/Hct/Plts:  33.1/11.9/35.8/329 (08/03 1925)  Na+/ K+: 128/4.8 BUN/Cr.: 34/2.93 AST/ALT/Total Bilirubin: 92/47/2.6 Serum CO2/ AG: 19/19 Troponin: 798 BNP: 287.5 WBC/ TMAX: 33/101 Lactic/ PCT: 7/34.88 SARS Coronavirus 2 by RT PCR: Negative UA: +Ketones, pyuria ABG: Venous/Arterial Blood Gas result:  pO2 125; pCO2 53; pH 7.25;  HCO3 23.3, %O2 Sat 98.3 EKG: nonspecific ST and T waves changes, sinus tachycardia, ST elevation in anterior leads? Repeat EKG. CXR:  Increased vascular congestion without overt edema.  Given the findings above concerning for sepsis with septic shock, code sepsis was initiated patient was started on broad-spectrum IV antibiotics following IV fluid resuscitation. A noncontrast CT head was obtained and showed no acute intracranial abnormality.  CT chest abdomen and pelvis was also obtained which showed right lower lobe infection/inflammation and marked perirectal emphysema tracking along the extraperitoneal right pelvis.  Associated partially visualized perineal/perirectal subcutaneous soft tissue emphysema concerning for infection such as necrotizing fasciitis.  CT findings were discussed with on-call general surgery Dr. RMilas Gainwho elected to take patient urgently to the OR for surgical debridement.  PCCM was consulted for ICU admission and further management.  Past Medical History  Anxiety,  Arthritis,  Asthma,  Back pain,  Breast cancer ([facility] (2016),  CHF (congestive heart failure) ([facility],  Claustrophobia,  Complication of anesthesia,  Constipation,  COPD (chronic obstructive pulmonary disease) ([place],  Diabetes mellitus without complication ([facility],  Duodenitis,  Erosive gastritis,  GERD (gastroesophageal reflux disease),  Gout,  Headache,  Hypertension,  Hypothyroidism,  Obesity, Sleep apnea on CPAP  Significant Hospital Events   8/3: Admit to ICU   Consults:  General Surgery  Procedures:  8/3: Intubation 8/3:  Significant Diagnostic Tests:  8/3: Chest Xray>Right lower lobe infection/inflammation 8/3: Noncontrast CT head>No acute intracranial abnormality 8/3: CTA abdomen and pelvis> Marked perirectal emphysema tracking along the extraperitoneal right pelvis. Associated partially visualized perineal/perirectal subcutaneus soft tissue emphysema. Markedly limited evaluation on this noncontrast study. Concern for infection such as a necrotizing fasciitis.  Micro Data:  8/3: SARS-CoV-2 PCR> negative 8/3:  Influenza PCR>  negative 8/3: Blood culture x2> 8/3: Urine Culture> 8/3: MRSA PCR>>  8/3: Strep pneumo urinary antigen> 8/3: Legionella urinary antigen>  Antimicrobials:  8/3 Vancomycin>> 8/3 Cefepime>> 8/3 Clindamycin>>  OBJECTIVE  BP 95/67   Pulse (!) 110   Temp (!) 101.6 F (38.7 C)   Resp (!) 26   SpO2 100%   Temp:  [101.6 F (38.7 C)-102.6 F (39.2 C)] 101.6 F (38.7 C) (08/03 2215) Pulse Rate:  [43-113] 110 (08/03 2215) Resp:  [14-47] 26 (08/03 2215) BP: (63-153)/(24-127) 95/67 (08/03 2215) SpO2:  [96 %-100 %] 100 % (08/03 2215) FiO2 (%):  [100 %] 100 % (08/03 2000)  Vent Mode: AC FiO2 (%):  [100 %] 100 % Set Rate:  [16 bmp-20 bmp] 20 bmp Vt Set:  [450 mL] 450 mL PEEP:  [5 cmH20] 5 cmH20  Physical Examination  GENERAL: 70 year-old critically ill patient lying in the bed INTUBATED, MECHANICALLY VENTILATED AND SEDATED EYES: Pupils equal, round, reactive to light and accommodation. No scleral icterus. Extraocular muscles intact.  HEENT: Head atraumatic, normocephalic. Oropharynx and nasopharynx clear.  NECK:  Supple, no jugular venous distention. No thyroid enlargement, no tenderness.  LUNGS: Decreased breath sounds bilaterally, no wheezing, rales,rhonchi or crepitation. No use of accessory muscles of respiration.  CARDIOVASCULAR: S1, S2 normal. No murmurs, rubs, or gallops.  ABDOMEN: Soft, nontender, nondistended. Bowel sounds present. No organomegaly or mass.  EXTREMITIES: Bilateral pitting edema of lower extremities. No cyanosis, or clubbing.  NEUROLOGIC: Cranial nerves II through XII are intact.  Muscle strength not assessed. Sensation intact. Gait not checked.  PSYCHIATRIC: The patient is INTUBATED AND SEDATED SKIN: WNL except for the perineal incision site  Labs/imaging that I havepersonally reviewed  (right click and "Reselect all SmartList Selections" daily)     Labs   CBC: Recent Labs  Lab 09/25/20 1555 10/18/2020 1925  WBC 13.8* 33.1*  NEUTROABS  10.1* 27.9*  HGB 12.2 11.9*  HCT 39.8 35.8*  MCV 86.0 81.5  PLT 274 [id number]    Basic Metabolic Panel: Recent Labs  Lab 09/25/20 1555 09/29/2020 1925  NA 143 128*  K 3.9 4.8  CL 101 90*  CO2 29 19*  GLUCOSE 150* 263*  BUN 14 34*  CREATININE 1.19* 2.93*  CALCIUM 9.8 8.7*   GFR: Estimated Creatinine Clearance: 29.7 mL/min (A) (by C-G formula based on SCr of 2.93 mg/dL (H)). Recent Labs  Lab 09/25/20 1555 10/15/2020 1925 10/17/2020 2038  PROCALCITON  --  34.88  --   WBC 13.8* 33.1*  --   LATICACIDVEN  --   --  7.0*    Liver Function Tests: Recent Labs  Lab 10/27/2020 1925  AST 92*  ALT 47*  ALKPHOS 86  BILITOT 2.6*  PROT 7.0  ALBUMIN 2.9*   No results for input(s): LIPASE, AMYLASE in the last 168 hours. No results for input(s): AMMONIA in the last 168 hours.  ABG    Component Value Date/Time   PHART 7.25 (L) 10/24/2020 2100   PCO2ART 53 (H) 10/09/2020 2100   PO2ART 125 (H) 10/27/2020 2100   HCO3 23.2 10/16/2020 2100   ACIDBASEDEF 4.4 (H) 10/22/2020 2100   O2SAT 98.3 10/27/2020 2100     Coagulation Profile: No results for input(s): INR, PROTIME in the last 168 hours.  Cardiac Enzymes: No results for input(s): CKTOTAL, CKMB, CKMBINDEX, TROPONINI in the last 168 hours.  HbA1C: Hemoglobin A1C  Date/Time Value Ref Range Status  10/23/2012 11:44 AM 8.0 (H) 4.2 - 6.3 % Final    Comment:  The American Diabetes Association recommends that a primary goal of therapy should be <7% and that physicians should reevaluate the treatment regimen in patients with HbA1c values consistently >8%.   08/27/2012 05:55 AM 7.2 (H) 4.2 - 6.3 % Final    Comment:    The American Diabetes Association recommends that a primary goal of therapy should be <7% and that physicians should reevaluate the treatment regimen in patients with HbA1c values consistently >8%.     CBG: Recent Labs  Lab 10/05/2020 1937 10/10/2020 2033  GLUCAP 40* 79*    Review of Systems:   UNABLE TO OBTAIN  PATIENT IS INTUBATED AND SEDATED  Past Medical History  She,  has a past medical history of Anxiety, Arthritis, Asthma, Back pain, Breast cancer ([place]) (2016), Cancer ([place]), CHF (congestive heart failure) ([place]), Claustrophobia, Complication of anesthesia, Constipation, COPD (chronic obstructive pulmonary disease) ([place]), Diabetes mellitus without complication ([place]), Duodenitis, Erosive gastritis, GERD (gastroesophageal reflux disease), Gout, Headache, Hypertension, Hypothyroidism, Obesity, Pain, Shortness of breath dyspnea, and Sleep apnea.   Surgical History    Past Surgical History:  Procedure Laterality Date   abd pain     ABDOMINAL HYSTERECTOMY     ANKLE FRACTURE SURGERY     BACK SURGERY     lumbar fusion   BREAST BIOPSY Left 2004   negative   BREAST BIOPSY Right 2016   positive- invasive mammary carcinoma   BREAST LUMPECTOMY Right 2016   [facility]   CATARACT EXTRACTION W/PHACO Right 06/28/2017   Procedure: CATARACT EXTRACTION PHACO AND INTRAOCULAR LENS PLACEMENT (IOC);  Surgeon: Birder Robson, MD;  Location: [facility] ORS;  Service: Ophthalmology;  Laterality: Right;  [place] 02:20.6 AP% 20.6 CDE 28.90 Fluid Pack Lot # [id number]   CHOLECYSTECTOMY     COLONOSCOPY     COLONOSCOPY WITH PROPOFOL N/A 09/07/2017   Procedure: COLONOSCOPY WITH PROPOFOL;  Surgeon: Toledo, Benay Pike, MD;  Location: [facility] ENDOSCOPY;  Service: Gastroenterology;  Laterality: N/A;   ESOPHAGOGASTRODUODENOSCOPY (EGD) WITH PROPOFOL N/A 02/06/2016   Procedure: ESOPHAGOGASTRODUODENOSCOPY (EGD) WITH PROPOFOL;  Surgeon: Lollie Sails, MD;  [facility] END[facility];  Service: Endoscopy;  Laterality: N/A;   FRACTURE SURGERY Right    ankle   OOPHORECTOMY     one ovary remaining   PARTIAL MASTECTOMY WITH NEEDLE LOCALIZATION Right 09/20/2014   Procedure: PARTIAL MASTECTOMY WITH NEEDLE LOCALIZATION;  Surgeon: Leonie Green, MD;  Location: [facility] ORS;  Service: General;  Laterality: Right;   SENTINEL NODE BIOPSY Right 09/20/2014    Procedure: SENTINEL NODE BIOPSY;  Surgeon: Leonie Green, MD;  Location: [facility] ORS;  Service: General;  Laterality: Right;   TONSILLECTOMY       Social History   reports that she has quit smoking. Her smoking use included e-cigarettes. She has never used smokeless tobacco. She reports previous alcohol use. She reports that she does not use drugs.   Family History   Her family history includes Breast cancer in her paternal aunt; Breast cancer (age of onset: 35) in her maternal aunt; Breast cancer (age of onset: 23) in her maternal aunt; Colon cancer in her mother; Lung cancer in her father. There is no history of Prostate cancer, Kidney cancer, or Bladder Cancer.   Allergies Allergies  Allergen Reactions   Aspirin Nausea Only and Other (See Comments)    Stomach burning   Furosemide Palpitations   Latex Itching, Rash and Other (See Comments)    SNEEZING   Meloxicam Nausea Only   Nsaids Nausea Only   Tape Rash  Home Medications  Prior to Admission medications   Medication Sig Start Date End Date Taking? Authorizing Provider  albuterol (VENTOLIN HFA) 108 (90 Base) MCG/ACT inhaler Inhale 2 puffs into the lungs every 6 (six) hours as needed for wheezing or shortness of breath.  02/20/14  Yes [provider]  calcium carbonate (CALCIUM 600) 600 MG TABS tablet Take 1 tablet (600 mg total) by mouth 2 (two) times daily with a meal. 10/02/15  Yes Corcoran, Drue Second, MD  Cholecalciferol (VITAMIN D3) 2000 UNITS capsule Take 2,000 Units by mouth daily.    Yes [provider]  dicyclomine (BENTYL) 10 MG capsule Take 10 mg by mouth 4 (four) times daily as needed for spasms.   Yes [provider]  Dulaglutide 0.75 MG/0.5ML SOPN Inject 1.5 mg into the skin once a week. 11/02/17  Yes [provider]  enalapril (VASOTEC) 20 MG tablet Take 20 mg by mouth every morning.  02/20/14  Yes [provider]  etodolac (LODINE) 400 MG tablet Take 400 mg by mouth 2  (two) times daily as needed.   Yes [provider]  fluticasone (FLOVENT HFA) 110 MCG/ACT inhaler Inhale into the lungs 2 (two) times daily.   Yes [provider]  gabapentin (NEURONTIN) 300 MG capsule Take 300 mg by mouth 3 (three) times daily as needed (for back pain).    Yes [provider]  guaiFENesin (MUCINEX) 600 MG 12 hr tablet Take 600 mg by mouth 2 (two) times daily as needed for cough or to loosen phlegm.    Yes [provider]  HYDROcodone-acetaminophen (NORCO/VICODIN) 5-325 MG per tablet Take 1 tablet by mouth every 6 (six) hours as needed for severe pain.  09/16/14  Yes [provider]  levothyroxine (SYNTHROID, LEVOTHROID) 75 MCG tablet Take 75 mcg by mouth daily before breakfast.  02/20/14  Yes [provider]  linaclotide (LINZESS) 145 MCG CAPS capsule Take 145 mcg by mouth daily before breakfast.   Yes [provider]  loratadine (CLARITIN) 10 MG tablet Take 10 mg by mouth at bedtime.    Yes [provider]  magnesium oxide (MAG-OX) 400 MG tablet TAKE ONE TABLET BY MOUTH TWICE DAILY 09/12/14  Yes [provider]  metoCLOPramide (REGLAN) 5 MG tablet Take 5 mg by mouth 3 (three) times daily before meals. 2-3 qd   Yes [provider]  metolazone (ZAROXOLYN) 2.5 MG tablet Take 2.5 mg by mouth every other day. 09/22/20  Yes [provider]  metoprolol (LOPRESSOR) 50 MG tablet Take 50 mg by mouth 2 (two) times daily.  02/20/14  Yes [provider]  omeprazole (PRILOSEC) 40 MG capsule Take 40 mg by mouth daily. 02/26/20 02/25/21 Yes [provider]  OVER THE COUNTER MEDICATION Take 1 tablet by mouth at bedtime. Alteril Natural Sleep Aid   Yes [provider]  polyethylene glycol (MIRALAX / GLYCOLAX) packet Take 17 g by mouth 4 (four) times daily as needed for moderate constipation.   Yes [provider]  potassium chloride (KLOR-CON) 10 MEQ tablet 10 mEq. PRN  with Torsemide 02/08/19  Yes [provider]  simvastatin (ZOCOR) 80 MG tablet Take 80 mg by mouth daily.  02/20/14  Yes [provider]  torsemide (DEMADEX) 20 MG tablet Take 20 mg by mouth daily. 08/06/20  Yes [provider]  triamterene-hydrochlorothiazide (MAXZIDE) 75-50 MG tablet Take 1 tablet by mouth daily. 06/27/20  Yes [provider]  venlafaxine (EFFEXOR) 37.5 MG tablet Take 1 tablet  by mouth once daily Patient not taking: Reported on 10/27/2020 12/20/19   Earlie Server, MD  Scheduled Meds:  pantoprazole (PROTONIX) IV  40 mg Intravenous QHS   Continuous Infusions:  sodium chloride     [START ON Oct 03, 2020] acetaminophen 1,000 mg (10/09/2020 2231)   [START ON 10/03/2020] ceFEPime (MAXIPIME) IV     clindamycin (CLEOCIN) IV 900 mg (10/28/2020 2245)   [START ON 2020/10/03] clindamycin (CLEOCIN) IV     fentaNYL infusion INTRAVENOUS Stopped (10/19/2020 2208)   midazolam Stopped (10/27/2020 2207)   norepinephrine (LEVOPHED) Adult infusion 5 mcg/min (10/10/2020 2308)   vancomycin     Followed by   vancomycin 1,500 mg (10/29/2020 2250)   [START ON 10/03/2020] vancomycin     PRN Meds:.docusate sodium, polyethylene glycol  Active Hospital Problem list   Acute hypoxic hypercapnic respiratory failure Sepsis with septic shock DKA AGMA AKI Acute metabolic encephalopathy Seizures? Elevated troponin Hyponatremia CHF  Assessment & Plan:  Acute Hypoxic / Hypercapnic Respiratory Failure secondary to / in the setting of PMHx: COPD, OSA on CPAP - Continue ventilator support & lung protective strategies - Wean PEEP & FiO2 as tolerated, maintain SpO2 > 90% - Head of bed elevated 30 degrees, VAP protocol in place - Plateau pressures less than 30 cm H20  - Intermittent chest x-ray & ABG PRN - Daily WUA with SBT as tolerated  - Ensure adequate pulmonary hygiene  - Budesonide nebs BID, bronchodilators PRN - PAD protocol in place: continue Fentanyl drip & Versed drip  Sepsis  with Septic Shock secondary to Necrotizing Fasciitis vs Fournier Gangrene Involving the perineum, perianal and right lateral pelvic wall. LRINEC Score: (CRP, wbc, hgb, Na+, Cr, Glucose)>6 high risk Lactic: 7.0, Baseline PCT: 34.88  Initial interventions/workup included: 2 L of NS/LR & Cefepime/ Vancomycin/ Clindamycin Status post surgical debridement POD # 0 - Supplemental oxygen as needed, to maintain SpO2 > 90% - Follow cultures, trend Lactic and  PCT - Monitor WBC/ Fever curve - IV antibiotics: cefepime, Clindamycin & vancomycin pending cultures - IVF hydration as needed - Continue vasopressors (Levophed) to maintain MAP> 65 - Strict I/O's: Goal UOP >0.5 mL/kg/hr - If Persistent hypotension will consider stress dose steroids - Wound care per surgery - General Surgery following, input appreciated  Diabetic Ketoacidosis likely due to severe Infection as above Anion gap, metabolic acidosis (pH: of , AG: 22, bicarbonate : mmol/l, urinary ketones mg/dl, glucose  mg/dl) PMHx: DM type 1  Home meds:  - Monitor ABG/VBG to assess severity of acidemia # Trigger Assessment (CXR, UA, negative, Blood Cultures for infection) - Troponin, EKG  shows no ischemia - Will check Lipase for pancreatitis - Start Insulin (regular) 0.1u/kg (~10 units) IV x1 then continue Insulin drip, DKA protocol - Keep NPO - Glucose: q1h to titrate insulin - Lab monitoring: q2-4h BMP+Phosphorus+pH (ABG/VBG)  - Continue  D5 1/2NS  - Goal to normalize anion gap  - Diabetes coordinator consult  Acute Metabolic Encephalopathy due to Hypercapnia Questionable Seizure activity - Loaded with Keppra. Hold AEDs for now - Obtain EEG - CT Head negative for acute intracranial abnormality - Provide supportive care  Elevated Troponin ?NSTEMI (No dynamic EKG changes) Demand ischemia? - Trend Troponin - Serial EKGs - ASA '81mg'$  PO daily  - check lipid panel  - Anti-coagulation (Heparin drip per ACS protocol) - Cardiology  Consult   AKI likely pre-renal in the setting of hypotension Hyponatremia likely in the setting of Necrotizing Faciitis - Monitor I&O's / urinary output - Follow BMP -  Ensure adequate renal perfusion - Avoid nephrotoxic agents as able - Replace electrolytes as indicated  Chronic Diastolic CHF (Last known EF -Hypertension Hx: HLD  - Continuous cardiac monitoring - Maintain MAP greater than 65 - IV Lasix as blood pressure and renal function permits; currently on Lasix at home will hold in the setting of above - Hold - Cardiology consult if appropriate - Repeat 2D Echocardiogram    Best practice:  Diet:  NPO Pain/Anxiety/Delirium protocol (if indicated): Yes (RASS goal 0) VAP protocol (if indicated): Yes DVT prophylaxis: Subcutaneous Heparin GI prophylaxis: PPI Glucose control:  SSI Yes Central venous access:  Yes, and it is still needed Arterial line:  Yes, and it is still needed Foley:  Yes, and it is still needed Mobility:  bed rest  PT consulted: N/A Last date of multidisciplinary goals of care discussion [8/4] Code Status:  full code Disposition: ICU   = Goals of Care = Code Status Order: FULL  Primary Emergency ContactTrinidee, [place], Home Phone: [phone] Wishes to pursue full aggressive treatment and intervention options, including CPR and intubation,  goals of care will be addressed on going with family if that should become necessary.  Critical care time: 45 minutes     Rufina Falco, DNP, CCRN, FNP-C, AGACNP-BC Acute Care Nurse Practitioner  [facility] Pager: [phone] [place] at [facility]  .

## 2020-10-30 NOTE — Progress Notes (Signed)
Inpatient Diabetes Program Recommendations  AACE/ADA: New Consensus Statement on Inpatient Glycemic Control (2015)  Target Ranges:  Prepandial:   less than 140 mg/dL      Peak postprandial:   less than 180 mg/dL (1-2 hours)      Critically ill patients:  140 - 180 mg/dL   Lab Results  Component Value Date   GLUCAP 231 (H) 2020/10/19   HGBA1C 8.0 (H) 10/23/2012    Review of Glycemic Control Results for GAYLA, CAREW (MRN [id number]) as of 10/19/20 08:43  Ref. Range 10/26/2020 19:37 10/08/2020 20:33 2020-10-19 02:03 19-Oct-2020 07:46  Glucose-Capillary Latest Ref Range: 70 - 99 mg/dL 280 (H) 257 (H) 211 (H) 231 (H)   Diabetes history: DM2 Outpatient Diabetes medications: Trulicity [id number] weekly, Amaryl (per Dr. Myrtis Ser note) Current orders for Inpatient glycemic control: Novolof 0-9 units Q4H, Solucortef 100 mg Q12H  Referral received for DM management.  Novolog 0-9 units Q4H started this morning.  Will follow and make recommendations.  Current A1C is pending.    See's Dr. Emily Filbert for DM2, Last A1C was 7.2% on 06/19/20.  Will continue to follow while inpatient.  Thank you, Reche Dixon, RN, BSN Diabetes Coordinator Inpatient Diabetes Program [phone] (team pager from 8a-5p)

## 2020-10-30 NOTE — Op Note (Signed)
Irrigation and excisional debridement of necrotizing abscess/infection extending from perirectal/right pelvic to perianal and peri-vulvar regions.  Pre-operative Diagnosis: Supralevator/pelvic perirectal abscess/necrotizing fasciitis, right perirectal and perineal regions.  Post-operative Diagnosis: same.    Surgeon: Ronny Bacon, M.D., [facility]  Anesthesia: General endotracheal  Findings: As noted on CT imaging extensive right-sided extrapelvic abscess process extending from perirectal space anteriorly, along with right-sided perianal necrotizing abscess/fasciitis extending anteriorly lateral to vulva and posteriorly to coccyx. Final wound measurement is 6 cm from perianal edge to lateral limit, 9 cm anterior to posterior half-circle ellipse of skin and subcutaneous tissues excised.  Anterior and posterior extension make the entire length of the incision 25 cm.  Depth extends through the levators into the extraperitoneal pelvis on the right side..  Estimated Blood Loss: 300 mL         Specimens: Skin and subcutaneous tissues 6 cm x 9 cm half oval excised in its entirety adjacent to the perianal region on the right.  Copious volumes of black malodorous liquefactive tissue debris.  No appreciable purulence.          Complications: none              Procedure Details  The patient was seen initially in the emergency room. The benefits, complications, treatment options, and expected outcomes were discussed with the patient's husband at bedside. The risks of bleeding, infection, failure to resolve sepsis/current septic shock, unanticipated injury, and extensive wound any of which will likely require further surgery were reviewed with the patient. The likelihood of improving the patient's symptoms with return to their baseline status is remarkably optimistic at best.  Mr. Badal concurred with the proposed plan, giving informed consent.  The patient was taken to Operating Room, identified and the  procedure verified.    Prior to the induction of general anesthesia, a total of a reported 5 antibiotics had been administered. VTE prophylaxis was in place.  General anesthesia was then administered and tolerated well. After the induction, the patient was positioned in the lithotomy position and the perineum and perianal regions and buttocks were prepped with Betadine and draped in the sterile fashion.  A Time Out was held and the above information confirmed. The previous bleb I had noted on the right medial buttock at the perianal region, was now appreciated to be a foci of liquefactive necrosis, I began there elliptically excising this area to arrive at the depth of the necrotizing process, which I then explored in all directions utilizing a suction tip and blunt digital dissection.  Additional circumferential widening of the wound was done to enable access to the deep necrotic process along the perianal area extending anteriorly lateral to the vulva.  I was able to identify this necrotizing process extending through the levator ani and into the pelvis but fortunately this process is limited to the right side.  Some surviving levator divided access to this area.  But I proceeded with exploring it completely to ensure adequate drainage.  Once I felt adequate excision of necrotic tissues was completed and exploration revealed no additional spontaneously dissecting tissues I proceeded with irrigating the wound with 3 L of normal saline utilizing a pulsed lavage system. After confirming adequate hemostasis, I used a total of 2 vaginal packs and a roll of Kerlix all tied sequentially wet with dilute Betadine. I packed the reaches of the wound cavity with forceps, and filled the exterior wound is well with the packing. We then secured ABD pads over this packing and applied  mesh briefs to help secure it along with tape.  I shared with Mr. Guerriero, the extent of her sepsis, her septic shock and the tenuous and  ominous prognosis that lies before [place].  Our current plan of support and anticipated return to the OR on Friday.     Ronny Bacon M.D., [facility] 10-13-2020 2:11 AM

## 2020-10-30 NOTE — Progress Notes (Signed)
Patient extubated per Dr. Montine Circle.

## 2020-10-30 NOTE — Death Summary Note (Signed)
NAME:  Teresa Holland, MRN:  [id number], DOB:  1951/02/07, LOS: 1 ADMISSION DATE:  10/06/2020, CONSULTATION DATE:  09/30/2020 REFERRING MD: Marjean Donna MD CHIEF COMPLAINT: SOB    HPI  70 y.o with significant PMH as below who presented to the ED with chief complaints of progressive shortness of breath.  Patient is currently intubated, mechanically ventilated and sedated so history obtained from ED notes. Per ED notes, patient apparently had a fall but denied hitting her head. She was brought in the ED with c/o of progressive shortness of breath and altered mental status. Per husband, patient felt like she had a boil in her bottom area for the past 2 days and was applying a OTC cream on it.  ED Course: On arrival to the ED, she was febrile (!) 102.2 F (39 C),with blood pressure (!) 63/11m Hg and pulse rate (!) 113,beats/min, RR 26. There were no focal neurological deficits; she was alert and oriented x self. Patient was placed on BiPAP for concerns of volume overload. Shortly afterwards she became unresponsive  and was noted with eyes deviating to the right but still breathing spontaneously. Patient received '2mg'$  of IV Ativan for suspected seizure activity. She apparently continue with the eye deviation requiring a second dose of 2 mg IV Ativan which broke the eye deviation back to midline. Unfortunately she was not able to protect her airway and was subsequently intubated for airway protection. Initial labs revealed:   PATIENT WITH SEPTIC SHOCK DUE TO PELVIC ABSCESS S/P SURGERY WITH MULTI ORGAN FAILURE, AFTER FAMILY DISCUSSION CODE STATUS CHANGED TO COMFORT CARE AND PATIENT PASSED AWAY AT 1March 27, 1025AM 8Aug 12, 2022WITH FAMILY AT BEDSIDE.  MAY SHE REST IN PEACE.    Labs/Diagnostics WBC/Hgb/Hct/Plts:  30.2/10.2/32.9/307 (08/12/20240345)  Na+/ K+: 128/4.8 BUN/Cr.: 34/2.93 AST/ALT/Total Bilirubin: 92/47/2.6 Serum CO2/ AG: 19/19 Troponin: 798 BNP: 287.5 WBC/ TMAX: 33/101 Lactic/ PCT: 7/34.88 SARS Coronavirus 2  by RT PCR: Negative UA: +Ketones, pyuria ABG: Venous/Arterial Blood Gas result:  pO2 125; pCO2 53; pH 7.25;  HCO3 23.3, %O2 Sat 98.3 EKG: nonspecific ST and T waves changes, sinus tachycardia, ST elevation in anterior leads? Repeat EKG. CXR: Increased vascular congestion without overt edema.  Given the findings above concerning for sepsis with septic shock, code sepsis was initiated patient was started on broad-spectrum IV antibiotics following IV fluid resuscitation. A noncontrast CT head was obtained and showed no acute intracranial abnormality.  CT chest abdomen and pelvis was also obtained which showed right lower lobe infection/inflammation and marked perirectal emphysema tracking along the extraperitoneal right pelvis.  Associated partially visualized perineal/perirectal subcutaneous soft tissue emphysema concerning for infection such as necrotizing fasciitis.  CT findings were discussed with on-call general surgery Dr. RMilas Gainwho elected to take patient urgently to the OR for surgical debridement.  PCCM was consulted for ICU admission and further management.  Past Medical History  Anxiety,  Arthritis,  Asthma,  Back pain,  Breast [place]26[place],  CHF (congestive heart failure) ([place],  Claustrophobia,  Complication of anesthesia,  Constipation,  COPD (chronic obstructive pulmonary disease) ([place],  Diabetes mellitus without complication ([place],  Duodenitis,  Erosive gastritis,  GERD (gastroesophageal reflux disease),  Gout,  Headache,  Hypertension,  Hypothyroidism,  Obesity, Sleep apnea on CPAP  Significant Hospital Events   8/3: Admit to ICU   Consults:  General Surgery  Procedures:  8/3: Intubation 8/3:  Significant Diagnostic Tests:  8/3: Chest Xray>Right lower lobe infection/inflammation 8/3: Noncontrast CT head>No acute intracranial abnormality 8/3: CTA abdomen  and pelvis> Marked perirectal emphysema tracking along the extraperitoneal right pelvis.  Associated partially visualized perineal/perirectal subcutaneus soft tissue emphysema. Markedly limited evaluation on this noncontrast study. Concern for infection such as a necrotizing fasciitis.  Micro Data:  8/3: SARS-CoV-2 PCR> negative 8/3: Influenza PCR> negative 8/3: Blood culture x2> 8/3: Urine Culture> 8/3: MRSA PCR>>  8/3: Strep pneumo urinary antigen> 8/3: Legionella urinary antigen>  Antimicrobials:  8/3 Vancomycin>> 8/3 Cefepime>> 8/3 Clindamycin>>  OBJECTIVE  BP (!) 58/31   Pulse 63   Temp 99.86 F (37.7 C)   Resp (!) 30   Wt (!) 170.4 kg   SpO2 99%   BMI 57.12 kg/m   Temp:  [98.06 F (36.7 C)-102.6 F (39.2 C)] 99.86 F (37.7 C) (08/04 1000) Pulse Rate:  [43-120] 63 (08/04 0945) Cardiac Rhythm: Normal sinus rhythm;Sinus tachycardia (08/04 0800) Resp:  [14-78] 30 (08/04 1000) BP: (58-184)/(24-171) 58/31 (08/04 1000) SpO2:  [96 %-100 %] 99 % (08/04 0945) Arterial Line BP: (34-102)/(25-54) 34/25 (08/04 1000) FiO2 (%):  [80 %-100 %] 80 % (08/04 0752) Weight:  [170.4 kg] 170.4 kg (08/04 0500)  Vent Mode: PRVC FiO2 (%):  [80 %-100 %] 80 % Set Rate:  [16 bmp-20 bmp] 16 bmp Vt Set:  [450 mL-480 mL] 480 mL PEEP:  [5 cmH20-8 cmH20] 8 cmH20  Physical Examination  GENERAL: 70 year-old critically ill patient lying in the bed INTUBATED, MECHANICALLY VENTILATED AND SEDATED EYES: Pupils equal, round, reactive to light and accommodation. No scleral icterus. Extraocular muscles intact.  HEENT: Head atraumatic, normocephalic. Oropharynx and nasopharynx clear.  NECK:  Supple, no jugular venous distention. No thyroid enlargement, no tenderness.  LUNGS: Decreased breath sounds bilaterally, no wheezing, rales,rhonchi or crepitation. No use of accessory muscles of respiration.  CARDIOVASCULAR: S1, S2 normal. No murmurs, rubs, or gallops.  ABDOMEN: Soft, nontender, nondistended. Bowel sounds present. No organomegaly or mass.  EXTREMITIES: Bilateral pitting edema of lower  extremities. No cyanosis, or clubbing.  NEUROLOGIC: [id number] PSYCHIATRIC: The patient is INTUBATED AND SEDATED SKIN: WNL except for the perineal incision site  Labs/imaging that I havepersonally reviewed  (right click and "Reselect all SmartList Selections" daily)     Labs   CBC: Recent Labs  Lab 09/25/20 1555 10/22/2020 1925 2020-10-09 0345  WBC 13.8* 33.1* 30.2*  NEUTROABS 10.1* 27.9*  --   HGB 12.2 11.9* 10.2*  HCT 39.8 35.8* 32.9*  MCV 86.0 81.5 84.8  PLT 274 329 307     Basic Metabolic Panel: Recent Labs  Lab 09/25/20 1555 10/16/2020 1925 10-09-2020 0111 Oct 09, 2020 0345 2020/10/09 0642  NA 143 128* 128* 131* 132*  K 3.9 4.8 4.6 4.2 4.8  CL 101 90* 96* 96* 96*  CO2 29 19* 20* 22 24  GLUCOSE 150* 263* 235* 293* 320*  BUN 14 34* 36* 38* 37*  CREATININE 1.19* 2.93* 2.95* 2.97* 2.62*  CALCIUM 9.8 8.7* 7.4* 7.6* 7.2*  MG  --   --   --  1.2*  --   PHOS  --   --   --  5.6*  --     GFR: Estimated Creatinine Clearance: 34.6 mL/min (A) (by C-G formula based on SCr of 2.62 mg/dL (H)). Recent Labs  Lab 09/25/20 1555 10/08/2020 1925 10/03/2020 2038 10-09-2020 0111 10/09/20 0345 09-Oct-2020 0815  PROCALCITON  --  34.88  --   --  40.85  --   WBC 13.8* 33.1*  --   --  30.2*  --   LATICACIDVEN  --   --  7.0* 2.8*  --  3.9*     Liver Function Tests: Recent Labs  Lab 10/12/2020 1925 23-Oct-2020 0642  AST 92* 60*  ALT 47* 33  ALKPHOS 86 71  BILITOT 2.6* 2.0*  PROT 7.0 5.4*  ALBUMIN 2.9* 2.0*    No results for input(s): LIPASE, AMYLASE in the last 168 hours. No results for input(s): AMMONIA in the last 168 hours.  ABG    Component Value Date/Time   PHART 7.29 (L) 2020-10-23 0754   PCO2ART 41 10-23-2020 0754   PO2ART 132 (H) 2020/10/23 0754   HCO3 19.7 (L) October 23, 2020 0754   ACIDBASEDEF 6.5 (H) 10/23/2020 0754   O2SAT 98.7 10/23/2020 0754      Coagulation Profile: No results for input(s): INR, PROTIME in the last 168 hours.  Cardiac Enzymes: No results for input(s):  CKTOTAL, CKMB, CKMBINDEX, TROPONINI in the last 168 hours.  HbA1C: Hemoglobin A1C  Date/Time Value Ref Range Status  10/23/2012 11:44 AM 8.0 (H) 4.2 - 6.3 % Final    Comment:    The American Diabetes Association recommends that a primary goal of therapy should be <7% and that physicians should reevaluate the treatment regimen in patients with HbA1c values consistently >8%.   08/27/2012 05:55 AM 7.2 (H) 4.2 - 6.3 % Final    Comment:    The American Diabetes Association recommends that a primary goal of therapy should be <7% and that physicians should reevaluate the treatment regimen in patients with HbA1c values consistently >8%.     CBG: Recent Labs  Lab 10/10/2020 1937 10/29/2020 2033 23-Oct-2020 0203 10/23/2020 0746  GLUCAP 280* 257* 211* 231*     Review of Systems:   UNABLE TO OBTAIN PATIENT IS INTUBATED AND SEDATED  Past Medical History  She,  has a past medical history of Anxiety, Arthritis, Asthma, Back pain, Breast cancer ([place]) (2016), Cancer ([place]), CHF (congestive heart failure) ([place]), Claustrophobia, Complication of anesthesia, Constipation, COPD (chronic obstructive pulmonary disease) ([place]), Diabetes mellitus without complication ([place]), Duodenitis, Erosive gastritis, GERD (gastroesophageal reflux disease), Gout, Headache, Hypertension, Hypothyroidism, Obesity, Pain, Shortness of breath dyspnea, and Sleep apnea.   Surgical History    Past Surgical History:  Procedure Laterality Date   abd pain     ABDOMINAL HYSTERECTOMY     ANKLE FRACTURE SURGERY     BACK SURGERY     lumbar fusion   BREAST BIOPSY Left 2004   negative   BREAST BIOPSY Right 2016   positive- invasive mammary carcinoma   BREAST LUMPECTOMY Right 2016   [facility]   CATARACT EXTRACTION W/PHACO Right 06/28/2017   Procedure: CATARACT EXTRACTION PHACO AND INTRAOCULAR LENS PLACEMENT (IOC);  Surgeon: Birder Robson, MD;  Location: [facility] ORS;  Service: Ophthalmology;  Laterality: Right;  [place] 02:20.6 AP% 20.6 CDE  28.90 Fluid Pack Lot # [id number]   CHOLECYSTECTOMY     COLONOSCOPY     COLONOSCOPY WITH PROPOFOL N/A 09/07/2017   Procedure: COLONOSCOPY WITH PROPOFOL;  Surgeon: Toledo, Benay Pike, MD;  Location: [facility] ENDOSCOPY;  Service: Gastroenterology;  Laterality: N/A;   ESOPHAGOGASTRODUODENOSCOPY (EGD) WITH PROPOFOL N/A 02/06/2016   Procedure: ESOPHAGOGASTRODUODENOSCOPY (EGD) WITH PROPOFOL;  Surgeon: Lollie Sails, MD;  Location: [facility] ENDOSCOPY;  Service: Endoscopy;  Laterality: N/A;   FRACTURE SURGERY Right    ankle   INCISION AND DRAINAGE PERIRECTAL ABSCESS N/A 10/26/2020   Procedure: Excisional.IRRIGATION AND DEBRIDEMENT  RIGHT  BUTTOCK, PERINEUM,PERIANAL AND GROIN REGIONS;  Surgeon: Ronny Bacon, MD;  Location: [facility] ORS;  Service: General;  Laterality: N/A;  Necrotizing fascitis.   OOPHORECTOMY  one ovary remaining   PARTIAL MASTECTOMY WITH NEEDLE LOCALIZATION Right 09/20/2014   Procedure: PARTIAL MASTECTOMY WITH NEEDLE LOCALIZATION;  Surgeon: Leonie Green, MD;  Location: [facility] ORS;  Service: General;  Laterality: Right;   SENTINEL NODE BIOPSY Right 09/20/2014   Procedure: SENTINEL NODE BIOPSY;  Surgeon: Leonie Green, MD;  Location: [facility] ORS;  Service: General;  Laterality: Right;   TONSILLECTOMY       Social History   reports that she has quit smoking. Her smoking use included e-cigarettes. She has never used smokeless tobacco. She reports previous alcohol use. She reports that she does not use drugs.   Family History   Her family history includes Breast cancer in her paternal aunt; Breast cancer (age of onset: 72) in her maternal aunt; Breast cancer (age of onset: 28) in her maternal aunt; Colon cancer in her mother; Lung cancer in her father. There is no history of Prostate cancer, Kidney cancer, or Bladder Cancer.   Allergies Allergies  Allergen Reactions   Aspirin Nausea Only and Other (See Comments)    Stomach burning   Furosemide Palpitations   Latex Itching, Rash  and Other (See Comments)    SNEEZING   Meloxicam Nausea Only   Nsaids Nausea Only   Tape Rash     Home Medications  Prior to Admission medications   Medication Sig Start Date End Date Taking? Authorizing Provider  albuterol (VENTOLIN HFA) 108 (90 Base) MCG/ACT inhaler Inhale 2 puffs into the lungs every 6 (six) hours as needed for wheezing or shortness of breath.  02/20/14  Yes [provider]  calcium carbonate (CALCIUM 600) 600 MG TABS tablet Take 1 tablet (600 mg total) by mouth 2 (two) times daily with a meal. 10/02/15  Yes Corcoran, Drue Second, MD  Cholecalciferol (VITAMIN D3) 2000 UNITS capsule Take 2,000 Units by mouth daily.    Yes [provider]  dicyclomine (BENTYL) 10 MG capsule Take 10 mg by mouth 4 (four) times daily as needed for spasms.   Yes [provider]  Dulaglutide 0.75 MG/0.5ML SOPN Inject 1.5 mg into the skin once a week. 11/02/17  Yes [provider]  enalapril (VASOTEC) 20 MG tablet Take 20 mg by mouth every morning.  02/20/14  Yes [provider]  etodolac (LODINE) 400 MG tablet Take 400 mg by mouth 2 (two) times daily as needed.   Yes [provider]  fluticasone (FLOVENT HFA) 110 MCG/ACT inhaler Inhale into the lungs 2 (two) times daily.   Yes [provider]  gabapentin (NEURONTIN) 300 MG capsule Take 300 mg by mouth 3 (three) times daily as needed (for back pain).    Yes [provider]  guaiFENesin (MUCINEX) 600 MG 12 hr tablet Take 600 mg by mouth 2 (two) times daily as needed for cough or to loosen phlegm.    Yes [provider]  HYDROcodone-acetaminophen (NORCO/VICODIN) 5-325 MG per tablet Take 1 tablet by mouth every 6 (six) hours as needed for severe pain.  09/16/14  Yes [provider]  levothyroxine (SYNTHROID, LEVOTHROID) 75 MCG tablet Take 75 mcg by mouth daily before breakfast.  02/20/14  Yes [provider]  linaclotide (LINZESS) 145 MCG CAPS capsule Take 145  mcg by mouth daily before breakfast.   Yes [provider]  loratadine (CLARITIN) 10 MG tablet Take 10 mg by mouth at bedtime.    Yes [provider]  magnesium oxide (MAG-OX) 400 MG tablet TAKE ONE TABLET BY MOUTH TWICE DAILY  09/12/14  Yes [provider]  metoCLOPramide (REGLAN) 5 MG tablet Take 5 mg by mouth 3 (three) times daily before meals. 2-3 qd   Yes [provider]  metolazone (ZAROXOLYN) 2.5 MG tablet Take 2.5 mg by mouth every other day. 09/22/20  Yes [provider]  metoprolol (LOPRESSOR) 50 MG tablet Take 50 mg by mouth 2 (two) times daily.  02/20/14  Yes [provider]  omeprazole (PRILOSEC) 40 MG capsule Take 40 mg by mouth daily. 02/26/20 02/25/21 Yes [provider]  OVER THE COUNTER MEDICATION Take 1 tablet by mouth at bedtime. Alteril Natural Sleep Aid   Yes [provider]  polyethylene glycol (MIRALAX / GLYCOLAX) packet Take 17 g by mouth 4 (four) times daily as needed for moderate constipation.   Yes [provider]  potassium chloride (KLOR-CON) 10 MEQ tablet 10 mEq. PRN with Torsemide 02/08/19  Yes [provider]  simvastatin (ZOCOR) 80 MG tablet Take 80 mg by mouth daily.  02/20/14  Yes [provider]  torsemide (DEMADEX) 20 MG tablet Take 20 mg by mouth daily. 08/06/20  Yes [provider]  triamterene-hydrochlorothiazide (MAXZIDE) 75-50 MG tablet Take 1 tablet by mouth daily. 06/27/20  Yes [provider]  venlafaxine (EFFEXOR) 37.5 MG tablet Take 1 tablet by mouth once daily Patient not taking: Reported on 10/21/2020 12/20/19   Earlie Server, MD  Scheduled Meds:  heparin injection (subcutaneous)  5,000 Units Subcutaneous Q8H   hydrocortisone sod succinate (SOLU-CORTEF) inj  100 mg Intravenous Q12H   insulin aspart  0-9 Units Subcutaneous Q4H   pantoprazole (PROTONIX) IV  40 mg Intravenous QHS   vancomycin variable dose per unstable renal function (pharmacist  dosing)   Does not apply See admin instructions   Continuous Infusions:  sodium chloride     amiodarone     ceFEPime (MAXIPIME) IV     clindamycin (CLEOCIN) IV Stopped (10-20-20 [id number])   fentaNYL infusion INTRAVENOUS 100 mcg/hr (October 20, 2020 1020)   midazolam 4 mg/hr (Oct 20, 2020 1020)   norepinephrine (LEVOPHED) Adult infusion 32 mcg/min (20-Oct-2020 1020)   norepinephrine     phenylephrine (NEO-SYNEPHRINE) Adult infusion Stopped (10-20-20 0954)   vancomycin     vasopressin Stopped (20-Oct-2020 0954)   PRN Meds:.fentaNYL, midazolam  Active Hospital Problem list   Acute hypoxic hypercapnic respiratory failure Sepsis with septic shock DKA AGMA AKI Acute metabolic encephalopathy Seizures? Elevated troponin Hyponatremia CHF  Assessment & Plan:  Acute Hypoxic / Hypercapnic Respiratory Failure secondary to [place]      - FULL mv SUPPORT   Sepsis with Septic Shock secondary to Necrotizing Fasciitis vs Fournier Gangrene Involving the perineum, perianal and right lateral pelvic wall. LRINEC Score: (CRP, wbc, hgb, Na+, Cr, Glucose)>6 high risk Lactic: 7.0, Baseline PCT: 34.88  Initial interventions/workup included: 2 L of NS/LR & Cefepime/ Vancomycin/ Clindamycin Status post surgical debridement POD # 0 - Supplemental oxygen as needed, to maintain SpO2 > 90% - Follow cultures, trend Lactic and  PCT - Monitor WBC/ Fever curve - IV antibiotics: cefepime, Clindamycin & vancomycin pending cultures - IVF hydration as needed - Continue vasopressors (Levophed) to maintain MAP> 65 - Strict I/O's: Goal UOP >0.5 mL/kg/hr - If Persistent hypotension will consider stress dose steroids - Wound care per surgery - General Surgery following, input appreciated  Diabetic Ketoacidosis likely due to severe Infection as above Anion gap, metabolic acidosis (pH: of , AG: 22, bicarbonate : mmol/l, urinary ketones mg/dl, glucose  mg/dl) PMHx: DM type  1  Home meds:  - Monitor ABG/VBG  to assess severity of acidemia # Trigger Assessment (CXR, UA, negative, Blood Cultures for infection) - Troponin, EKG  shows no ischemia - Will check Lipase for pancreatitis - Start Insulin (regular) 0.1u/kg (~10 units) IV x1 then continue Insulin drip, DKA protocol - Keep NPO - Glucose: q1h to titrate insulin - Lab monitoring: q2-4h BMP+Phosphorus+pH (ABG/VBG)  - Continue  D5 1/2NS  - Goal to normalize anion gap  - Diabetes coordinator consult  Acute Metabolic Encephalopathy due to Hypercapnia Questionable Seizure activity - Loaded with Keppra. Hold AEDs for now - Obtain EEG - CT Head negative for acute intracranial abnormality - Provide supportive care  Elevated Troponin ?NSTEMI (No dynamic EKG changes) Demand ischemia? - Trend Troponin - Serial EKGs - ASA '81mg'$  PO daily  - check lipid panel  - Anti-coagulation (Heparin drip per ACS protocol) - Cardiology Consult   AKI likely pre-renal in the setting of hypotension Hyponatremia likely in the setting of Necrotizing Faciitis - Monitor I&O's / urinary output - Follow BMP - Ensure adequate renal perfusion - Avoid nephrotoxic agents as able - Replace electrolytes as indicated  Chronic Diastolic CHF (Last known EF -Hypertension Hx: HLD  - Continuous cardiac monitoring - Maintain MAP greater than 65 - IV Lasix as blood pressure and renal function permits; currently on Lasix at home will hold in the setting of above - Hold - Cardiology consult if appropriate - Repeat 2D Echocardiogram    Best practice:  Diet:  NPO Pain/Anxiety/Delirium protocol (if indicated): Yes (RASS goal 0) VAP protocol (if indicated): Yes DVT prophylaxis: Subcutaneous Heparin GI prophylaxis: PPI Glucose control:  SSI Yes Central venous access:  Yes, and it is still needed Arterial line:  Yes, and it is still needed Foley:  Yes, and it is still needed Mobility:  bed rest  PT consulted: N/A Last date of multidisciplinary goals of care discussion  [8/4] Code Status:  full code Disposition: ICU   = Goals of Care = Code Status Order: FULL  Primary Emergency ContactDonel, Brittian, Home Phone: [phone] Wishes to pursue full aggressive treatment and intervention options, including CPR and intubation,  goals of care will be addressed on going with family if that should become necessary.    Ottie Glazier, M.D.  [facility] [place]   .

## 2020-10-30 NOTE — Procedures (Signed)
Central Venous Catheter Insertion Procedure Note  Teresa Holland  [id number]  02/07/1951  Date:08-Oct-2020  Time:5:49 AM   Provider Performing:Aadhya Bustamante A Tobyn Osgood   Procedure: Insertion of Non-tunneled Central Venous [phone]) with US guidance [id number])   Indication(s) Medication administration and Difficult access  Consent Risks of the procedure as well as the alternatives and risks of each were explained to the patient and/or caregiver.  Consent for the procedure was obtained and is signed in the bedside chart  Anesthesia Topical only with 1% lidocaine   Timeout Verified patient identification, verified procedure, site/side was marked, verified correct patient position, special equipment/implants available, medications/allergies/relevant history reviewed, required imaging and test results available.  Sterile Technique Maximal sterile technique including full sterile barrier drape, hand hygiene, sterile gown, sterile gloves, mask, hair covering, sterile ultrasound probe cover (if used).  Procedure Description Area of catheter insertion was cleaned with chlorhexidine and draped in sterile fashion.  With real-time ultrasound guidance a central venous catheter was placed into the left internal jugular vein. Nonpulsatile blood flow and easy flushing noted in all ports.  The catheter was sutured in place and sterile dressing applied.  Complications/Tolerance None; patient tolerated the procedure well. Chest X-ray is ordered to verify placement for internal jugular or subclavian cannulation.   Chest x-ray is not ordered for femoral cannulation.  EBL Minimal  Specimen(s) None   Rufina Falco, DNP, CCRN, FNP-C, AGACNP-BC Acute Care Nurse Practitioner  [facility] Pager: [phone] [place] [facility]

## 2020-10-30 NOTE — Consult Note (Signed)
Consultation Note Date: Oct 11, 2020   Patient Name: Teresa Holland  DOB: 05-25-1950  MRN: [id number]  Age / Sex: 70 y.o., female  PCP: Danella Penton, MD Referring Physician: Vida Rigger, MD  Reason for Consultation:   HPI/Patient Profile: 70 y.o. female  with past medical history of asthma, breast cancer (2016), CHF, COPD, diabetes mellitus, GERD, hypertension, hypothyroidism, obesity, and sleep apnea on CPAP admitted on 10/09/2020 with shortness of breath and AMS. Patient became unable to protect airway and required intubation. Patient diagnosed with septic shock and started on IV antibiotics and fluid resuscitation. CT chest abdomen and pelvis was obtained which showed right lower lobe infection/inflammation and marked perirectal emphysema tracking along the extraperitoneal right pelvis. Also revealed partially visualized perineal/perirectal subcutaneous soft tissue emphysema concerning for infection such as necrotizing fasciitis. Patient taking urgently to OR for surgical debridement. Patient now remains on mechanical ventilator, 3 vasopressors, and with multiorgan failure. PMT requested to join Dr. Karna Christmas for urgent family meeting.   Clinical Assessment and Goals of Care: I have reviewed medical records including EPIC notes, labs and imaging, received report from Dr. Karna Christmas, assessed the patient and then met with patient's spouse, sister, and 2 sons  to discuss diagnosis prognosis, GOC, EOL wishes, disposition and options.  Dr. Karna Christmas clearly shared with patient's family the severity of her illness - explaining her infection and multiorgan failure. Also explained all medical interventions patient has received. Poor prognosis and concern this is not an illness patient can survive was shared with family. All of family's questions and concerns were addressed.  Encouraged family to consider DNR/DNI status understanding evidenced based poor  outcomes in similar hospitalized patients, as the cause of the arrest is likely associated with chronic/terminal disease rather than a reversible acute cardio-pulmonary event.   Family understandably emotional during conversation - shocked by the news. Emotional support provided.   Family shares they would like to continue all current support.   Assisted family to visit patient following family meeting: at time of visit patient was rapidly declining - HR high, BP dropping. Situation was explained to family - they understood she was in dying process. Son clearly stated to please just make her comfortable and patient's spouse agreed. Comfort medications administered per Dr. Karna Christmas. Family supported at bedside as patient passed away. Emotional support provided throughout by multiple members of medical team.  Primary Decision Maker NEXT OF KIN - spouse   SUMMARY OF RECOMMENDATIONS   - patient passed away with family at bedside     Primary Diagnoses: Present on Admission:  Severe sepsis with septic shock (HCC)   I have reviewed the medical record, interviewed the patient and family, and examined the patient. The following aspects are pertinent.  Past Medical History:  Diagnosis Date   Anxiety    Arthritis    Asthma    Back pain    Breast cancer (HCC) 2016   right breast   Cancer (HCC)    breast   CHF (congestive heart failure) (HCC)    Claustrophobia    SEVERE   Complication of anesthesia    heart issues 2004 during gb surgery had to be revived   Constipation    COPD (chronic obstructive pulmonary disease) (HCC)    Diabetes mellitus without complication (HCC)    Duodenitis    Erosive gastritis    GERD (gastroesophageal reflux disease)    Gout    Headache    Hypertension    Hypothyroidism    Obesity  Pain    chronic back   Shortness of breath dyspnea    Sleep apnea    could not use cpap uses 1.5 l [place] at night   Social History   Socioeconomic History   Marital  status: Married    Spouse name: Not on file   Number of children: Not on file   Years of education: Not on file   Highest education level: Not on file  Occupational History   Not on file  Tobacco Use   Smoking status: Former    Types: E-cigarettes   Smokeless tobacco: Never   Tobacco comments:    quit 06/14 uses e cigs occasionally  Vaping Use   Vaping Use: Some days  Substance and Sexual Activity   Alcohol use: Not Currently   Drug use: No   Sexual activity: Not on file  Other Topics Concern   Not on file  Social History Narrative   Not on file   Social Determinants of Health   Financial Resource Strain: Not on file  Food Insecurity: Not on file  Transportation Needs: Not on file  Physical Activity: Not on file  Stress: Not on file  Social Connections: Not on file   Family History  Problem Relation Age of Onset   Breast cancer Paternal Aunt        28's   Breast cancer Maternal Aunt 106   Breast cancer Maternal Aunt 64   Colon cancer Mother    Lung cancer Father    Prostate cancer Neg Hx    Kidney cancer Neg Hx    Bladder Cancer Neg Hx    Scheduled Meds:  heparin injection (subcutaneous)  5,000 Units Subcutaneous Q8H   hydrocortisone sod succinate (SOLU-CORTEF) inj  100 mg Intravenous Q12H   insulin aspart  0-9 Units Subcutaneous Q4H   pantoprazole (PROTONIX) IV  40 mg Intravenous QHS   vancomycin variable dose per unstable renal function (pharmacist dosing)   Does not apply See admin instructions   Continuous Infusions:  sodium chloride     amiodarone     ceFEPime (MAXIPIME) IV     clindamycin (CLEOCIN) IV Stopped (Oct 25, 2020 4401)   fentaNYL infusion INTRAVENOUS 100 mcg/hr (25-Oct-2020 1020)   midazolam 4 mg/hr (Oct 25, 2020 1020)   norepinephrine (LEVOPHED) Adult infusion 32 mcg/min (10-25-2020 1020)   norepinephrine     phenylephrine (NEO-SYNEPHRINE) Adult infusion Stopped (10-25-2020 0954)   vancomycin     vasopressin Stopped (10/25/20 0954)   PRN  Meds:.fentaNYL, midazolam Allergies  Allergen Reactions   Aspirin Nausea Only and Other (See Comments)    Stomach burning   Furosemide Palpitations   Latex Itching, Rash and Other (See Comments)    SNEEZING   Meloxicam Nausea Only   Nsaids Nausea Only   Tape Rash   Review of Systems  Unable to perform ROS: Intubated   Physical Exam Constitutional:      Comments: Unresponsive on vent and multiple vasopressors  Cardiovascular:     Rate and Rhythm: Tachycardia present.     Comments: BP dropping   Vital Signs: BP (!) 58/31   Pulse 63   Temp 99.86 F (37.7 C)   Resp (!) 30   Wt (!) 170.4 kg   SpO2 99%   BMI 57.12 kg/m  Pain Scale: CPOT   Pain Score: 0-No pain   SpO2: SpO2: 99 % O2 Device:SpO2: 99 % O2 Flow Rate: .O2 Flow Rate (L/min): 1 L/min  IO: Intake/output summary:  Intake/Output Summary (Last 24 hours)  at 10/16/2020 1211 Last data filed at 16-Oct-2020 0901 Gross per 24 hour  Intake 2469.05 ml  Output 300 ml  Net 2169.05 ml    LBM: Last BM Date: 16-Oct-2020 Baseline Weight: Weight: (!) 170.4 kg Most recent weight: Weight: (!) 170.4 kg     Palliative Assessment/Data: PPS 10%    Time Total: 60 minutes Greater than 50%  of this time was spent counseling and coordinating care related to the above assessment and plan.  Juel Burrow, DNP, AGNP-C Palliative Medicine Team [phone] Pager: [phone]

## 2020-10-30 NOTE — Progress Notes (Signed)
Chaplain Maggie met patient and family members at bedside for end of life. Space was made for prayer and storytelling. Chaplain offered ministry of presence, empathetic listening, and hospitality. Family members remained until patient passed and Chaplain shared spiritual and social/emotional support in the wake of patient's death. 

## 2020-10-30 NOTE — Progress Notes (Signed)
Sepsis tacking by [facility]

## 2020-10-30 DEATH — deceased

## 2021-01-30 ENCOUNTER — Ambulatory Visit: Payer: Medicare HMO | Admitting: Oncology

## 2021-01-30 ENCOUNTER — Other Ambulatory Visit: Payer: Medicare HMO
